# Patient Record
Sex: Male | Born: 1955 | Race: White | Hispanic: No | Marital: Married | Smoking: Former smoker
Health system: Southern US, Community
[De-identification: ages and names within clinical notes are randomized; demographics above are authoritative.]

## PROBLEM LIST (undated history)

## (undated) DIAGNOSIS — R972 Elevated prostate specific antigen [PSA]: Secondary | ICD-10-CM

## (undated) DIAGNOSIS — J449 Chronic obstructive pulmonary disease, unspecified: Secondary | ICD-10-CM

## (undated) DIAGNOSIS — I2510 Atherosclerotic heart disease of native coronary artery without angina pectoris: Secondary | ICD-10-CM

## (undated) DIAGNOSIS — K219 Gastro-esophageal reflux disease without esophagitis: Secondary | ICD-10-CM

## (undated) DIAGNOSIS — M1990 Unspecified osteoarthritis, unspecified site: Secondary | ICD-10-CM

## (undated) DIAGNOSIS — E785 Hyperlipidemia, unspecified: Secondary | ICD-10-CM

## (undated) DIAGNOSIS — N529 Male erectile dysfunction, unspecified: Secondary | ICD-10-CM

## (undated) DIAGNOSIS — I209 Angina pectoris, unspecified: Secondary | ICD-10-CM

## (undated) DIAGNOSIS — J189 Pneumonia, unspecified organism: Secondary | ICD-10-CM

## (undated) DIAGNOSIS — Z87442 Personal history of urinary calculi: Secondary | ICD-10-CM

## (undated) DIAGNOSIS — I10 Essential (primary) hypertension: Secondary | ICD-10-CM

## (undated) DIAGNOSIS — K859 Acute pancreatitis without necrosis or infection, unspecified: Secondary | ICD-10-CM

## (undated) DIAGNOSIS — F419 Anxiety disorder, unspecified: Secondary | ICD-10-CM

## (undated) DIAGNOSIS — M792 Neuralgia and neuritis, unspecified: Secondary | ICD-10-CM

## (undated) DIAGNOSIS — N400 Enlarged prostate without lower urinary tract symptoms: Secondary | ICD-10-CM

## (undated) DIAGNOSIS — J310 Chronic rhinitis: Secondary | ICD-10-CM

## (undated) DIAGNOSIS — M6208 Separation of muscle (nontraumatic), other site: Secondary | ICD-10-CM

## (undated) DIAGNOSIS — K224 Dyskinesia of esophagus: Secondary | ICD-10-CM

## (undated) HISTORY — DX: Hyperlipidemia, unspecified: E78.5

## (undated) HISTORY — PX: EXCISION MORTON'S NEUROMA: SHX5013

## (undated) HISTORY — DX: Dyskinesia of esophagus: K22.4

## (undated) HISTORY — DX: Atherosclerotic heart disease of native coronary artery without angina pectoris: I25.10

## (undated) HISTORY — DX: Neuralgia and neuritis, unspecified: M79.2

## (undated) HISTORY — DX: Angina pectoris, unspecified: I20.9

## (undated) HISTORY — PX: CHOLECYSTECTOMY: SHX55

## (undated) HISTORY — DX: Male erectile dysfunction, unspecified: N52.9

## (undated) HISTORY — PX: OTHER SURGICAL HISTORY: SHX169

## (undated) HISTORY — DX: Benign prostatic hyperplasia without lower urinary tract symptoms: N40.0

## (undated) HISTORY — DX: Elevated prostate specific antigen (PSA): R97.20

## (undated) HISTORY — DX: Essential (primary) hypertension: I10

## (undated) HISTORY — DX: Separation of muscle (nontraumatic), other site: M62.08

## (undated) HISTORY — PX: HERNIA REPAIR: SHX51

---

## 2004-01-31 ENCOUNTER — Inpatient Hospital Stay: Payer: Self-pay | Admitting: Internal Medicine

## 2006-01-03 ENCOUNTER — Inpatient Hospital Stay: Payer: Self-pay | Admitting: Surgery

## 2006-01-03 ENCOUNTER — Other Ambulatory Visit: Payer: Self-pay

## 2006-03-14 ENCOUNTER — Ambulatory Visit: Payer: Self-pay | Admitting: Unknown Physician Specialty

## 2006-08-20 ENCOUNTER — Emergency Department: Payer: Self-pay

## 2007-04-30 ENCOUNTER — Ambulatory Visit: Payer: Self-pay | Admitting: Internal Medicine

## 2008-03-26 HISTORY — PX: CARDIAC CATHETERIZATION: SHX172

## 2008-07-21 ENCOUNTER — Ambulatory Visit: Payer: Self-pay | Admitting: Internal Medicine

## 2008-07-29 ENCOUNTER — Ambulatory Visit: Payer: Self-pay | Admitting: Internal Medicine

## 2008-08-10 ENCOUNTER — Ambulatory Visit: Payer: Self-pay | Admitting: Unknown Physician Specialty

## 2009-08-22 ENCOUNTER — Ambulatory Visit: Payer: Self-pay | Admitting: Unknown Physician Specialty

## 2009-10-25 ENCOUNTER — Ambulatory Visit: Payer: Self-pay | Admitting: Pain Medicine

## 2009-11-07 ENCOUNTER — Ambulatory Visit: Payer: Self-pay | Admitting: Pain Medicine

## 2009-11-10 ENCOUNTER — Ambulatory Visit: Payer: Self-pay | Admitting: Pain Medicine

## 2009-11-24 ENCOUNTER — Ambulatory Visit: Payer: Self-pay | Admitting: Pain Medicine

## 2009-11-25 ENCOUNTER — Ambulatory Visit (HOSPITAL_COMMUNITY): Admission: RE | Admit: 2009-11-25 | Discharge: 2009-11-25 | Payer: Self-pay | Admitting: Neurosurgery

## 2011-11-11 ENCOUNTER — Inpatient Hospital Stay: Payer: Self-pay | Admitting: Internal Medicine

## 2011-11-11 LAB — AMYLASE: Amylase: 125 U/L — ABNORMAL HIGH (ref 25–115)

## 2011-11-11 LAB — URINALYSIS, COMPLETE
Bacteria: NONE SEEN
Glucose,UR: NEGATIVE mg/dL (ref 0–75)
Ketone: NEGATIVE
Leukocyte Esterase: NEGATIVE
Ph: 7 (ref 4.5–8.0)
Specific Gravity: 1.016 (ref 1.003–1.030)

## 2011-11-11 LAB — COMPREHENSIVE METABOLIC PANEL
Albumin: 3.7 g/dL (ref 3.4–5.0)
Alkaline Phosphatase: 109 U/L (ref 50–136)
Anion Gap: 5 — ABNORMAL LOW (ref 7–16)
BUN: 10 mg/dL (ref 7–18)
Potassium: 3.9 mmol/L (ref 3.5–5.1)
SGOT(AST): 30 U/L (ref 15–37)
SGPT (ALT): 34 U/L (ref 12–78)
Total Protein: 7.6 g/dL (ref 6.4–8.2)

## 2011-11-11 LAB — CBC
HCT: 43.9 % (ref 40.0–52.0)
RDW: 13.6 % (ref 11.5–14.5)
WBC: 9.3 10*3/uL (ref 3.8–10.6)

## 2011-11-11 LAB — TROPONIN I: Troponin-I: <0.02 ng/mL

## 2011-11-12 LAB — COMPREHENSIVE METABOLIC PANEL
Alkaline Phosphatase: 111 U/L (ref 50–136)
BUN: 8 mg/dL (ref 7–18)
Bilirubin,Total: 0.7 mg/dL (ref 0.2–1.0)
Co2: 25 mmol/L (ref 21–32)
Creatinine: 0.84 mg/dL (ref 0.60–1.30)
EGFR (Non-African Amer.): >60
Osmolality: 275 (ref 275–301)
Sodium: 138 mmol/L (ref 136–145)
Total Protein: 6.8 g/dL (ref 6.4–8.2)

## 2011-11-12 LAB — CBC WITH DIFFERENTIAL/PLATELET
Basophil #: 0.1 10*3/uL (ref 0.0–0.1)
Eosinophil %: 1.3 %
Lymphocyte #: 0.9 10*3/uL — ABNORMAL LOW (ref 1.0–3.6)
Lymphocyte %: 10.2 %
MCV: 88 fL (ref 80–100)
Monocyte %: 7.4 %
Neutrophil %: 80.4 %
Platelet: 160 10*3/uL (ref 150–440)
RBC: 4.49 10*6/uL (ref 4.40–5.90)
RDW: 13.3 % (ref 11.5–14.5)

## 2011-11-12 LAB — LIPASE, BLOOD: Lipase: 760 U/L — ABNORMAL HIGH (ref 73–393)

## 2011-11-13 LAB — BASIC METABOLIC PANEL
Anion Gap: 7 (ref 7–16)
BUN: 11 mg/dL (ref 7–18)
Chloride: 106 mmol/L (ref 98–107)
Co2: 25 mmol/L (ref 21–32)
Creatinine: 0.84 mg/dL (ref 0.60–1.30)
Glucose: 112 mg/dL — ABNORMAL HIGH (ref 65–99)
Osmolality: 276 (ref 275–301)
Potassium: 3.6 mmol/L (ref 3.5–5.1)

## 2011-11-13 LAB — LIPASE, BLOOD: Lipase: 820 U/L — ABNORMAL HIGH (ref 73–393)

## 2011-11-14 LAB — CBC WITH DIFFERENTIAL/PLATELET
Basophil #: 0.1 10*3/uL (ref 0.0–0.1)
Basophil %: 0.5 %
Eosinophil #: 0 10*3/uL (ref 0.0–0.7)
HCT: 34.6 % — ABNORMAL LOW (ref 40.0–52.0)
HGB: 11.8 g/dL — ABNORMAL LOW (ref 13.0–18.0)
Lymphocyte #: 0.9 10*3/uL — ABNORMAL LOW (ref 1.0–3.6)
Lymphocyte %: 6.7 %
MCHC: 34 g/dL (ref 32.0–36.0)
Monocyte #: 1.5 x10 3/mm — ABNORMAL HIGH (ref 0.2–1.0)
Neutrophil #: 11.6 10*3/uL — ABNORMAL HIGH (ref 1.4–6.5)
RDW: 13.4 % (ref 11.5–14.5)
WBC: 14.1 10*3/uL — ABNORMAL HIGH (ref 3.8–10.6)

## 2011-11-14 LAB — LIPID PANEL
Cholesterol: 175 mg/dL (ref 0–200)
Ldl Cholesterol, Calc: 110 mg/dL — ABNORMAL HIGH (ref 0–100)
Triglycerides: 145 mg/dL (ref 0–200)

## 2011-11-14 LAB — BASIC METABOLIC PANEL
Calcium, Total: 7.6 mg/dL — ABNORMAL LOW (ref 8.5–10.1)
Co2: 26 mmol/L (ref 21–32)
Potassium: 3.5 mmol/L (ref 3.5–5.1)
Sodium: 140 mmol/L (ref 136–145)

## 2011-11-14 LAB — LIPASE, BLOOD: Lipase: 236 U/L (ref 73–393)

## 2011-11-15 LAB — COMPREHENSIVE METABOLIC PANEL
Anion Gap: 6 — ABNORMAL LOW (ref 7–16)
Calcium, Total: 7.8 mg/dL — ABNORMAL LOW (ref 8.5–10.1)
Chloride: 106 mmol/L (ref 98–107)
Co2: 27 mmol/L (ref 21–32)
EGFR (African American): >60
EGFR (Non-African Amer.): >60
Potassium: 3.5 mmol/L (ref 3.5–5.1)
SGOT(AST): 30 U/L (ref 15–37)
SGPT (ALT): 31 U/L (ref 12–78)

## 2011-11-15 LAB — CBC WITH DIFFERENTIAL/PLATELET
Basophil %: 0.4 %
HCT: 33.9 % — ABNORMAL LOW (ref 40.0–52.0)
HGB: 11.8 g/dL — ABNORMAL LOW (ref 13.0–18.0)
Lymphocyte %: 9.7 %
MCHC: 34.9 g/dL (ref 32.0–36.0)

## 2011-11-16 LAB — CBC WITH DIFFERENTIAL/PLATELET
Basophil #: 0 10*3/uL (ref 0.0–0.1)
Lymphocyte %: 11.8 %
Monocyte %: 10.5 %
Neutrophil %: 76 %
Platelet: 182 10*3/uL (ref 150–440)
RDW: 13.5 % (ref 11.5–14.5)
WBC: 8.9 10*3/uL (ref 3.8–10.6)

## 2011-12-05 ENCOUNTER — Other Ambulatory Visit: Payer: Self-pay

## 2011-12-05 ENCOUNTER — Telehealth: Payer: Self-pay

## 2011-12-05 DIAGNOSIS — K861 Other chronic pancreatitis: Secondary | ICD-10-CM

## 2011-12-05 NOTE — Telephone Encounter (Signed)
Pt needs to be instructed and meds reviewed 

## 2011-12-05 NOTE — Telephone Encounter (Signed)
Pt has been notified and meds reviewed pt will call with any questions or concerns 

## 2011-12-05 NOTE — Telephone Encounter (Signed)
Left message on machine to call back  

## 2011-12-12 ENCOUNTER — Telehealth: Payer: Self-pay

## 2011-12-12 NOTE — Telephone Encounter (Signed)
Left message on machine to call back  

## 2011-12-12 NOTE — Telephone Encounter (Signed)
Pt has been re instructed and meds reviewed he will call if this does not work for him

## 2011-12-12 NOTE — Telephone Encounter (Signed)
eus changed to 01/17/12 1110 am

## 2012-01-17 ENCOUNTER — Encounter (HOSPITAL_COMMUNITY)
Admission: RE | Disposition: A | Discharge status: Home / Self Care | Payer: Self-pay | Source: Ambulatory Visit | Attending: Gastroenterology

## 2012-01-17 ENCOUNTER — Encounter (HOSPITAL_COMMUNITY): Payer: Self-pay | Admitting: Anesthesiology

## 2012-01-17 ENCOUNTER — Encounter (HOSPITAL_COMMUNITY): Payer: Self-pay | Admitting: *Deleted

## 2012-01-17 ENCOUNTER — Ambulatory Visit (HOSPITAL_COMMUNITY): Payer: 59 | Admitting: Anesthesiology

## 2012-01-17 ENCOUNTER — Ambulatory Visit (HOSPITAL_COMMUNITY)
Admission: RE | Admit: 2012-01-17 | Discharge: 2012-01-17 | Disposition: A | Discharge status: Home / Self Care | Payer: 59 | Source: Ambulatory Visit | Attending: Gastroenterology | Admitting: Gastroenterology

## 2012-01-17 DIAGNOSIS — K859 Acute pancreatitis without necrosis or infection, unspecified: Secondary | ICD-10-CM

## 2012-01-17 DIAGNOSIS — K838 Other specified diseases of biliary tract: Secondary | ICD-10-CM | POA: Insufficient documentation

## 2012-01-17 DIAGNOSIS — K8689 Other specified diseases of pancreas: Secondary | ICD-10-CM | POA: Insufficient documentation

## 2012-01-17 DIAGNOSIS — K219 Gastro-esophageal reflux disease without esophagitis: Secondary | ICD-10-CM | POA: Insufficient documentation

## 2012-01-17 DIAGNOSIS — K861 Other chronic pancreatitis: Secondary | ICD-10-CM

## 2012-01-17 DIAGNOSIS — Z9089 Acquired absence of other organs: Secondary | ICD-10-CM | POA: Insufficient documentation

## 2012-01-17 HISTORY — DX: Gastro-esophageal reflux disease without esophagitis: K21.9

## 2012-01-17 HISTORY — DX: Chronic rhinitis: J31.0

## 2012-01-17 HISTORY — DX: Acute pancreatitis without necrosis or infection, unspecified: K85.90

## 2012-01-17 HISTORY — PX: EUS: SHX5427

## 2012-01-17 HISTORY — DX: Unspecified osteoarthritis, unspecified site: M19.90

## 2012-01-17 HISTORY — DX: Anxiety disorder, unspecified: F41.9

## 2012-01-17 HISTORY — DX: Male erectile dysfunction, unspecified: N52.9

## 2012-01-17 SURGERY — UPPER ENDOSCOPIC ULTRASOUND (EUS) LINEAR
Anesthesia: Monitor Anesthesia Care

## 2012-01-17 MED ORDER — KETAMINE HCL 10 MG/ML IJ SOLN
INTRAMUSCULAR | Status: DC | PRN
  Administered 2012-01-17: 10 mg via INTRAVENOUS

## 2012-01-17 MED ORDER — PROPOFOL 10 MG/ML IV EMUL
INTRAVENOUS | Status: DC | PRN
  Administered 2012-01-17: 140 ug/kg/min via INTRAVENOUS

## 2012-01-17 MED ORDER — FENTANYL CITRATE 0.05 MG/ML IJ SOLN
INTRAMUSCULAR | Status: DC | PRN
  Administered 2012-01-17: 50 ug via INTRAVENOUS

## 2012-01-17 MED ORDER — BUTAMBEN-TETRACAINE-BENZOCAINE 2-2-14 % EX AERO
INHALATION_SPRAY | CUTANEOUS | Status: DC | PRN
  Administered 2012-01-17: 2 via TOPICAL

## 2012-01-17 MED ORDER — SODIUM CHLORIDE 0.9 % IV SOLN
INTRAVENOUS | Status: DC
  Administered 2012-01-17: 11:00:00 via INTRAVENOUS

## 2012-01-17 MED ORDER — MIDAZOLAM HCL 5 MG/5ML IJ SOLN
INTRAMUSCULAR | Status: DC | PRN
  Administered 2012-01-17: 2 mg via INTRAVENOUS

## 2012-01-17 NOTE — Anesthesia Postprocedure Evaluation (Signed)
  Anesthesia Post-op Note  Patient: Joseph Larson  Procedure(s) Performed: Procedure(s) (LRB): UPPER ENDOSCOPIC ULTRASOUND (EUS) LINEAR (N/A)  Patient Location: PACU  Anesthesia Type: MAC  Level of Consciousness: awake and alert   Airway and Oxygen Therapy: Patient Spontanous Breathing  Post-op Pain: mild  Post-op Assessment: Post-op Vital signs reviewed, Patient's Cardiovascular Status Stable, Respiratory Function Stable, Patent Airway and No signs of Nausea or vomiting  Post-op Vital Signs: stable  Complications: No apparent anesthesia complications

## 2012-01-17 NOTE — Anesthesia Preprocedure Evaluation (Signed)
Anesthesia Evaluation  Patient identified by MRN, date of birth, ID band Patient awake    Reviewed: Allergy & Precautions, H&P , NPO status , Patient's Chart, lab work & pertinent test results  Airway Mallampati: II TM Distance: >3 FB Neck ROM: Full    Dental No notable dental hx. (+) Teeth Intact   Pulmonary neg pulmonary ROS, former smoker,  breath sounds clear to auscultation  Pulmonary exam normal       Cardiovascular negative cardio ROS  Rhythm:Regular Rate:Normal     Neuro/Psych PSYCHIATRIC DISORDERS Anxiety negative neurological ROS  negative psych ROS   GI/Hepatic negative GI ROS, Neg liver ROS,   Endo/Other  negative endocrine ROS  Renal/GU negative Renal ROS  negative genitourinary   Musculoskeletal negative musculoskeletal ROS (+)   Abdominal   Peds negative pediatric ROS (+)  Hematology negative hematology ROS (+)   Anesthesia Other Findings Front upper caps  Reproductive/Obstetrics negative OB ROS                           Anesthesia Physical Anesthesia Plan  ASA: II  Anesthesia Plan: MAC   Post-op Pain Management:    Induction: Intravenous  Airway Management Planned: Nasal Cannula and Simple Face Mask  Additional Equipment:   Intra-op Plan:   Post-operative Plan: Extubation in OR  Informed Consent: I have reviewed the patients History and Physical, chart, labs and discussed the procedure including the risks, benefits and alternatives for the proposed anesthesia with the patient or authorized representative who has indicated his/her understanding and acceptance.   Dental advisory given  Plan Discussed with: CRNA  Anesthesia Plan Comments:         Anesthesia Quick Evaluation

## 2012-01-17 NOTE — H&P (Signed)
  HPI: This is a very pleasant man with at least two episodes of mild acute pancreatitis.  Had GB in past.  Occasional etoh intake.  CT scan while admitted 2 months ago ([facility]) suggested edema in head of pancrease    Past Medical History  Diagnosis Date  . Pancreatitis   . GERD (gastroesophageal reflux disease)   . Anxiety   . Rhinitis     chronic  . ED (erectile dysfunction)   . Arthritis     osteoarthritis    No past surgical history on file.  Current Facility-Administered Medications  Medication Dose Route Frequency Provider Last Rate Last Dose  . 0.9 %  sodium chloride infusion   Intravenous Continuous Rachael Fee, MD        Allergies as of 12/05/2011  . (No Known Allergies)    No family history on file.  History   Social History  . Marital Status: Married    Spouse Name: N/A    Number of Children: N/A  . Years of Education: N/A   Occupational History  . Not on file.   Social History Main Topics  . Smoking status: Not on file  . Smokeless tobacco: Not on file  . Alcohol Use: Not on file  . Drug Use: Not on file  . Sexually Active: Not on file   Other Topics Concern  . Not on file   Social History Narrative  . No narrative on file      Physical Exam: BP 155/104  Temp 98.1 F (36.7 C) (Oral)  Resp 13  SpO2 99% Constitutional: generally well-appearing Psychiatric: alert and oriented x3 Abdomen: soft, nontender, nondistended, no obvious ascites, no peritoneal signs, normal bowel sounds     Assessment and plan: 56 y.o. male with recurrent pancreatitis, unclear etiology  For upper EUS today

## 2012-01-17 NOTE — Preoperative (Signed)
Beta Blockers   Reason not to administer Beta Blockers:Not Applicable 

## 2012-01-17 NOTE — Op Note (Signed)
[facility] [address] [place], [address]   ENDOSCOPIC ULTRASOUND PROCEDURE REPORT  PATIENT: Joseph Larson, Joseph Larson  MR#: [id number] BIRTHDATE: [birth year]  GENDER: Male ENDOSCOPIST: Rachael Fee, MD REFERRED BY:  Lynnae Prude, M.D. PROCEDURE DATE:  01/17/2012 PROCEDURE:   Upper EUS ASA CLASS:      Class III INDICATIONS:   recurrent pancreatitis (GB removed several years ago). MEDICATIONS: MAC sedation, administered by CRNA  DESCRIPTION OF PROCEDURE:   After the risks benefits and alternatives of the procedure were  explained, informed consent was obtained. The patient was then placed in the left, lateral, decubitus postion and IV sedation was administered. Throughout the procedure, the patients blood pressure, pulse and oxygen saturations were monitored continuously.  Under direct visualization, the EUS 110107 and Pentax EUS Linear [id number] endoscope was introduced through the mouth  and advanced to the second portion of the duodenum .  Water was used as necessary to provide an acoustic interface.  Upon completion of the imaging, water was removed and the patient was sent to the recovery room in satisfactory condition.   Endoscopic findings: 1. Normal esophagus, stomach and duodenum  EUS findings: 1. The pancreatic parenchyma was somewhat edematous appearing but there were no discrete masses and no signs of chronic pancreatitis. 2. Main pancreatic duct was normal; non-dilated and pancreatic divisim was ruled out on this exam 3. CBD was slightly dilated (6.69mm, which is likely physiologic after gallbladder removal) and contained no stones 4. Gallbladder surgically absent 5. Limited views of liver, spleen, portal and splenic vessels were all normal  Impression: Mildly edematous pancreas without discrete masses or signs of chronic pancreatitis. Pancreatic divisim ruled out.  No retained CBD stones.  Unclear etiology of his recent pancreatitis.  Would observe clinically for now.   _______________________________ eSigned:  Rachael Fee, MD 01/17/2012 1:03 PM

## 2012-01-17 NOTE — Transfer of Care (Signed)
Immediate Anesthesia Transfer of Care Note  Patient: Joseph Larson  Procedure(s) Performed: Procedure(s) (LRB) with comments: UPPER ENDOSCOPIC ULTRASOUND (EUS) LINEAR (N/A) - radial linear   Patient Location: PACU  Anesthesia Type: MAC  Level of Consciousness: awake, alert , oriented and patient cooperative  Airway & Oxygen Therapy: Patient Spontanous Breathing and Patient connected to nasal cannula oxygen  Post-op Assessment: Report given to PACU RN, Post -op Vital signs reviewed and stable and Patient moving all extremities X 4  Post vital signs: Reviewed and stable  Complications: No apparent anesthesia complications

## 2012-01-21 ENCOUNTER — Encounter (HOSPITAL_COMMUNITY): Payer: Self-pay | Admitting: Gastroenterology

## 2012-09-07 ENCOUNTER — Emergency Department: Payer: Self-pay | Admitting: Internal Medicine

## 2012-09-07 LAB — COMPREHENSIVE METABOLIC PANEL
Albumin: 3.8 g/dL (ref 3.4–5.0)
Anion Gap: 7 (ref 7–16)
BUN: 18 mg/dL (ref 7–18)
Chloride: 106 mmol/L (ref 98–107)
Creatinine: 1.65 mg/dL — ABNORMAL HIGH (ref 0.60–1.30)
EGFR (African American): 52 — ABNORMAL LOW
EGFR (Non-African Amer.): 45 — ABNORMAL LOW
Osmolality: 276 (ref 275–301)
Total Protein: 7.3 g/dL (ref 6.4–8.2)

## 2012-09-07 LAB — URINALYSIS, COMPLETE
Bilirubin,UR: NEGATIVE
Blood: NEGATIVE
Glucose,UR: NEGATIVE mg/dL (ref 0–75)
Leukocyte Esterase: NEGATIVE
WBC UR: <1 /HPF (ref 0–5)

## 2012-09-07 LAB — CBC
HCT: 41.2 % (ref 40.0–52.0)
MCH: 30.2 pg (ref 26.0–34.0)
Platelet: 181 10*3/uL (ref 150–440)
RBC: 4.76 10*6/uL (ref 4.40–5.90)

## 2014-01-25 DIAGNOSIS — R972 Elevated prostate specific antigen [PSA]: Secondary | ICD-10-CM | POA: Insufficient documentation

## 2014-05-03 DIAGNOSIS — M5136 Other intervertebral disc degeneration, lumbar region: Secondary | ICD-10-CM | POA: Insufficient documentation

## 2014-05-03 DIAGNOSIS — S39012A Strain of muscle, fascia and tendon of lower back, initial encounter: Secondary | ICD-10-CM | POA: Insufficient documentation

## 2014-05-14 DIAGNOSIS — M5116 Intervertebral disc disorders with radiculopathy, lumbar region: Secondary | ICD-10-CM | POA: Insufficient documentation

## 2014-06-04 ENCOUNTER — Ambulatory Visit: Payer: Self-pay | Admitting: Surgery

## 2014-07-07 DIAGNOSIS — M48061 Spinal stenosis, lumbar region without neurogenic claudication: Secondary | ICD-10-CM | POA: Insufficient documentation

## 2014-07-13 NOTE — H&P (Signed)
PATIENT NAME:  EDU, ON MR#:  [id number] DATE OF BIRTH:  Feb 17, 1956  DATE OF ADMISSION:  11/11/2011  PRIMARY CARE PHYSICIAN: Dr. Netty Starring   HISTORY OF PRESENT ILLNESS: History obtained from patient, family at bedside, old records reviewed, imaging studies and EKG reviewed personally. Case discussed with ER physician.   CHIEF COMPLAINT: Acute abdominal pain of one day.   HISTORY OF PRESENT ILLNESS: 59 year old male patient with history of arthritis, tobacco abuse, acute pancreatitis in 2007 status post cholecystectomy presents to the Emergency Room complaining of acute onset of epigastric abdominal pain yesterday evening. This has progressively worsened along with significant nausea and patient presented to Emergency Room. He did not have any vomiting, diarrhea, fever, rash. The only new medication he has been started on is nabumetone 500 mg oral 2 times a day. Patient had has not had any problems since 2007 with pancreatitis. Today his lipase is 1300 with CT scan of the abdomen showing no acute abnormalities.   Case was discussed with Dr. Vira Agar who has suggested patient be admitted to the hospital.   Patient has no aggravating or relieving factors. No radiation.   PAST MEDICAL HISTORY:  1. Tobacco abuse. 2. Acute pancreatitis in 2007 which is thought to be secondary to gallbladder and had cholecystectomy.  3. Arthritis.  4. Gastroesophageal reflux disease.   FAMILY HISTORY: Positive for father with carcinoma of the liver and alcoholism. Mother with diabetes.   SOCIAL HISTORY: Patient smokes 1/2 pack a day. Occasional alcohol, one drink twice a month. No illicit drugs. Lives at home with his wife.   CODE STATUS: FULL CODE.    ALLERGIES: Codeine makes him nauseated.   REVIEW OF SYSTEMS: CONSTITUTIONAL: No fever, fatigue, weakness. EYES: No blurred or double vision, pain, redness. ENT: No tinnitus, ear pain, hearing loss or allergies. RESPIRATORY: No cough, wheeze, hemoptysis.  CARDIOVASCULAR: No chest pain, orthopnea, edema. GASTROINTESTINAL: Complains of nausea. No vomiting, diarrhea. Complains of epigastric abdominal pain. GENITOURINARY: No dysuria, hematuria. ENDOCRINE: No polyuria, nocturia, thyroid problems. HEMATOLOGIC/LYMPHATIC: No Patient complains of arthritis. No swelling or redness. NEUROLOGIC: No focal numbness, weakness, dysarthria. PSYCHIATRIC: No anxiety, depression.   HOME MEDICATIONS:  1. Nexium 40 mg oral once a day.  2. Nabumetone 500 mg oral 2 times a day.   PHYSICAL EXAMINATION:  VITAL SIGNS: Temperature 97.6, pulse 66, respirations 18, blood pressure 141/95, saturating 97% on room air.   GENERAL: Obese Caucasian male patient lying in bed in significant respiratory distress secondary to his abdominal pain holding his abdomen and restless.   PSYCHIATRIC: Alert, oriented x3, anxious, restless. Normal judgment.   HEENT: Atraumatic, normocephalic. Oral mucosa moist and pink. External ears and nose normal. No pallor. No icterus. Pupils bilaterally equal and reactive to light.   NECK: Supple. No thyromegaly. No palpable lymph nodes. Trachea midline. No carotid bruit, JVD.   CARDIOVASCULAR: S1, S2, regular rate and rhythm without any murmurs. Peripheral pulses 2+.   RESPIRATORY: Normal work of breathing. Clear to auscultation on both sides, not using accessory muscles.   GASTROINTESTINAL: Soft abdomen, tenderness in the epigastric and left upper quadrant area maximum but tenderness on deep palpation all over. No rigidity, guarding. Bowel sounds present. No hepatosplenomegaly palpable.   SKIN: Warm and dry. No petechiae, rash, ulcers.   MUSCULOSKELETAL: No joint swelling, redness, effusion of the large joints. Normal muscle tone.   NEUROLOGICAL: Motor strength 5/5 in upper and lower extremities. Sensation to fine touch intact all over.   LABORATORY, DIAGNOSTIC AND RADIOLOGICAL DATA: Lab  studies show glucose 77, BUN 10, creatinine 0.99, lipase  1339, calcium 8.4. AST, ALT, alkaline phosphatase, bilirubin normal. Troponin less than 0.02. WBC 9.3, hemoglobin 14.9, platelets 190. Urinalysis shows no WBC or bacteria.   CT scan of the abdomen and pelvis shows no acute abdominal or pelvic pathology, shows hepatic steatosis.   EKG shows normal sinus rhythm.   ASSESSMENT AND PLAN:  1. Acute pancreatitis with severely elevated lipase. Will start patient on IV fluids. No clear etiology at this time. Will check fasting lipid profile. Will consult GI for further input with the case. Patient has already had cholecystectomy in the past. Liver function test is in the normal range at this time. With repeat lipase in the morning.  2. Arthritis, stable. Continue medications.  3. Gastroesophageal reflux disease. Continue his Nexium.  4. Elevated blood pressure without diagnosis of hypertension, likely secondary from the pain. Needs to be monitored. Does not have diagnosis of hypertension previously.  5. Tobacco abuse. Patient has been counseled for more than three minutes to quit smoking. Patient is in contemplation stage. Offered nicotine patch.  6. Deep vein thrombosis prophylaxis with heparin.  7. CODE STATUS: FULL CODE.     TIME SPENT: Time spent today on this case was 45 minutes with more than 50% time spent in coordination of care.   ____________________________ Leia Alf. Hideo Googe, MD srs:cms D: 11/11/2011 21:17:14 ET T: 11/12/2011 08:06:24 ET JOB#: [id number]  cc: Alveta Heimlich R. Darvin Neighbours, MD, <Dictator> Dion Body, MD Manya Silvas, MD Neita Carp MD ELECTRONICALLY SIGNED 11/12/2011 12:24

## 2014-07-13 NOTE — Consult Note (Signed)
Chief Complaint:   Subjective/Chief Complaint Looks comfortable but still c/o upper abdominal discomfort and bloating. No vomiting. Tolerating full liquids but does not want to drink too much.   VITAL SIGNS/ANCILLARY NOTES: **Vital Signs.:   23-Aug-13 14:30   Vital Signs Type Routine   Temperature Temperature (F) 97.5   Celsius 36.3   Temperature Source Oral   Pulse Pulse 58   Respirations Respirations 18   Systolic BP Systolic BP 409   Diastolic BP (mmHg) Diastolic BP (mmHg) 69   Mean BP 85   Pulse Ox % Pulse Ox % 92   Pulse Ox Activity Level  At rest   Oxygen Delivery Room Air/ 21 %   Brief Assessment:   Additional Physical Exam Abdomen is somewhat distended but soft. Bowel sounds normal.   Lab Results: Routine Hem:  23-Aug-13 04:59    WBC (CBC) 8.9   RBC (CBC)  3.76   Hemoglobin (CBC)  11.5   Hematocrit (CBC)  33.3   Platelet Count (CBC) 182   MCV 89   MCH 30.6   MCHC 34.6   RDW 13.5   Neutrophil % 76.0   Lymphocyte % 11.8   Monocyte % 10.5   Eosinophil % 1.2   Basophil % 0.5   Neutrophil #  6.8   Lymphocyte # 1.1   Monocyte # 0.9   Eosinophil # 0.1   Basophil # 0.0 (Result(s) reported on 16 Nov 2011 at 05:54AM.)   Assessment/Plan:  Assessment/Plan:   Assessment Acute pancreatitis, much better. patient continues to be unhappy about the abdominal discomfort although overall seems much more comfortable. No leucocytosis or fever.    Plan Agree with slowly advancing diet. Agree with PO pain meds. Probable DC in 1-2 days. Will sign off. Please call on call GI over the weekend if needed.   Electronic Signatures: Jill Side (MD)  (Signed 23-Aug-13 15:29)  Authored: Chief Complaint, VITAL SIGNS/ANCILLARY NOTES, Brief Assessment, Lab Results, Assessment/Plan   Last Updated: 23-Aug-13 15:29 by Jill Side (MD)

## 2014-07-13 NOTE — Consult Note (Signed)
Chief Complaint:   Subjective/Chief Complaint Still with same abdominal pain. No vomiting. No bowel movements. Repeat CT negative for any acute intra-abdominal pathology except mild pancreatitis. Lipase normal. Abdomen is soft. Positive tenderness but no rebound or other peritoneal signs.  Impression: Acute pancreatitis. Lipase is now normal and CT does not show any necrosis or psudocyst although patient continues to complain of sigificant pain. Abdominal examination is non acute as well.   Recommendations: Continue NPO. Obtain serum lactic acid. EGD in am although doubt PUD as the culprit. Will follow. I will be out of town for the rest of the day please contact Dr. Candace Cruise if needed. Thanks.   Electronic Signatures: Jill Side (MD)  (Signed 21-Aug-13 09:06)  Authored: Chief Complaint   Last Updated: 21-Aug-13 09:06 by Jill Side (MD)

## 2014-07-13 NOTE — Consult Note (Signed)
Chief Complaint:   Subjective/Chief Complaint Continues to complain of severe abdominal pai. Lipase better. Amylase normal. No BM or flatus.   VITAL SIGNS/ANCILLARY NOTES: **Vital Signs.:   20-Aug-13 14:02   Vital Signs Type Routine   Temperature Temperature (F) 98.9   Celsius 37.1   Temperature Source AdultAxillary   Pulse Pulse 55   Respirations Respirations 18   Systolic BP Systolic BP 300   Diastolic BP (mmHg) Diastolic BP (mmHg) 73   Mean BP 94   Pulse Ox % Pulse Ox % 93   Pulse Ox Activity Level  At rest   Oxygen Delivery Room Air/ 21 %   Brief Assessment:   Additional Physical Exam Positive abdominal tenderness in upper abdomen. Very sluggish bowel sounds. No rebound.   Lab Results: Routine Chem:  20-Aug-13 04:10    Glucose, Serum  112   BUN 11   Creatinine (comp) 0.84   Sodium, Serum 138   Potassium, Serum 3.6   Chloride, Serum 106   CO2, Serum 25   Calcium (Total), Serum  7.6   Anion Gap 7   Osmolality (calc) 276   eGFR (African American) >60   eGFR (Non-African American) >60 (eGFR values <36m/min/1.73 m2 may be an indication of chronic kidney disease (CKD). Calculated eGFR is useful in patients with stable renal function. The eGFR calculation will not be reliable in acutely ill patients when serum creatinine is changing rapidly. It is not useful in  patients on dialysis. The eGFR calculation may not be applicable to patients at the low and high extremes of body sizes, pregnant women, and vegetarians.)   Lipase  820 (Result(s) reported on 13 Nov 2011 at 05:28AM.)   Amylase, Serum 82 (Result(s) reported on 13 Nov 2011 at 05:28AM.)   Assessment/Plan:  Assessment/Plan:   Assessment ? Acute pancreatitis. His pain is out of proportion to CT findings.    Plan Repeat CT scan today to r/o other etiologies. Continue present management.   Electronic Signatures: IJill Side(MD)  (Signed 20-Aug-13 17:43)  Authored: Chief Complaint, VITAL  SIGNS/ANCILLARY NOTES, Brief Assessment, Lab Results, Assessment/Plan   Last Updated: 20-Aug-13 17:43 by IJill Side(MD)

## 2014-07-13 NOTE — Consult Note (Signed)
Chief Complaint:   Subjective/Chief Complaint EGD normal. Clear liquid diet, advance as tolerated.  Consider reducing narcotics. Will follow.   Electronic Signatures: Jill Side (MD)  (Signed [phone] 15:25)  Authored: Chief Complaint   Last Updated: 22-Aug-13 15:25 by Jill Side (MD)

## 2014-07-13 NOTE — Discharge Summary (Signed)
PATIENT NAME:  Joseph Larson, Joseph Larson MR#:  [id number] DATE OF BIRTH:  14-Sep-1955  DATE OF ADMISSION:  11/11/2011 DATE OF DISCHARGE:  11/17/2011  HISTORY OF PRESENT ILLNESS: Joseph Larson is a middle-aged gentleman who presented to the Emergency Room with a 24-hour history of abdominal pain. In the Emergency Room he was found to have a lipase of 1300, although his CT scan of the abdomen showed no acute abnormalities. The patient was therefore admitted for further evaluation. It was noted that he had had acute pancreatitis in 2007 that was felt to be due to gallstones and underwent cholecystectomy at that time.   PAST MEDICAL HISTORY:  1. Gastroesophageal reflux. 2. Osteoarthritis. 3. Tobacco abuse.   ALLERGIES: Codeine.   MEDICATIONS: The patient's only medication was basically nabumetone 500 mg b.i.d. for arthritis.    ADMISSION VITAL SIGNS: Temperature 97.6, pulse 66, respirations 18, blood pressure 141/95. Examination as described by the admitting physician was notable only for tenderness in the epigastrium and left upper quadrant. Bowel sounds were noted to be normal. There were no masses or organomegaly. The remainder of the examination was basically unremarkable.   LABORATORY, DIAGNOSTIC, AND RADIOLOGIC DATA: Admission CBC showed a hemoglobin of 14.9 with hematocrit of 43.9. White count was 9300. Platelet count was 190,000. Admission comprehensive metabolic panel showed a calcium of 8.4 but was otherwise unremarkable. Admission lipase was 1339. Amylase was 125. Admission urinalysis was unremarkable. Admission electrocardiogram was within normal limits. CT scan of the abdomen and pelvis was suggestive of fatty infiltration of the liver, but was otherwise unremarkable.   HOSPITAL COURSE: The patient was admitted to the regular medical floor where he was rehydrated with IV fluids and made n.p.o.  He was seen in consultation by gastroenterology who felt like the patient did have acute pancreatitis. A repeat  CT scan was done on 08/20, which this time was consistent with pancreatitis. The patient's hospital course was one of slow but gradual improvement. His diet was gradually progressed. He was ambulated without difficulty. He was eventually bridged from injectable pain medication to pain medication by mouth.   DISCHARGE DIAGNOSIS: Acute pancreatitis.   DISCHARGE DISPOSITION: The patient was discharged on a soft diet as tolerated. He was also placed on Protonix 40 mg b.i.d. He was advised he could continue his arthritis medication. He did not give a very significant history of alcohol use but was advised to avoid all alcohol. He was given a prescription for oxycodone 5 mg, to take 1 to 2 tablets every six hours as needed for pain. He is to follow up with Dr. Netty Starring within the week. He is to stay out of work until he is seen in followup.     ____________________________ Hewitt Blade. Sarina Ser, MD [email or web address] D: 11/22/2011 08:05:30 ET T: 11/22/2011 13:53:42 ET JOB#: [id number]  cc: Joseph Reichmann B. Sarina Ser, MD, <Dictator> Joseph Mussel III MD ELECTRONICALLY SIGNED 11/28/2011 6:20

## 2014-07-13 NOTE — Consult Note (Signed)
Brief Consult Note: Diagnosis: Acute pancreatitis.   Patient was seen by consultant.   Consult note dictated.   Comments: Acute pancreatitis. ? etiology.  Recommendations: NPO. Increase Demerol to 50 mg q 4 hours PRN. Continue Zofran and Phenergan. Will follow.  Electronic Signatures: Jill Side (MD)  (Signed 19-Aug-13 18:20)  Authored: Brief Consult Note   Last Updated: 19-Aug-13 18:20 by Jill Side (MD)

## 2014-07-13 NOTE — Consult Note (Signed)
PATIENT NAME:  Joseph Larson, MONGER MR#:  [id number] DATE OF BIRTH:  08-26-1955  DATE OF CONSULTATION:  11/12/2011  REFERRING PHYSICIAN:  Prime Doc  CONSULTING PHYSICIAN:  Jill Side, MD  REASON FOR CONSULTATION: Acute pancreatitis.   HISTORY OF PRESENT ILLNESS: This is a 59 year old male with history of arthritis, tobacco abuse, history of pancreatitis in 2007 status post cholecystectomy. The patient was admitted yesterday with acute onset of epigastric abdominal pain. Serum lipase was about 1500. CT scan of the abdomen did not show any acute pancreatic abnormalities. LFTs were normal. The patient denies starting any new drugs except for nabumetone which is not a well known drug to cause pancreatitis. The patient drinks occasionally. According to him, the last time he had a drink was about a week ago, according to the wife it was probably two weeks ago.   PAST MEDICAL HISTORY:  1. History of tobacco abuse. 2. Acute pancreatitis in the past.  3. History of arthritis. 4. Gastroesophageal reflux disease.   FAMILY HISTORY: Positive for father with carcinoma of the liver and alcoholism.   ALLERGIES: Codeine.   REVIEW OF SYSTEMS: The patient is complaining of severe abdominal pain as well as nausea. He vomited earlier today. He is passing flatus but no bowel movements.   MEDICATIONS AT HOME:  1. Nexium 40 mg a day. 2. Nabumetone 500 mg b.i.d.   PHYSICAL EXAMINATION:   GENERAL: Middle-aged male who appears uncomfortable due to abdominal pain.   VITAL SIGNS: Temperature 98.8, heart rate is in the 60's and 70's, blood pressure 152/80. Clinically he is not jaundiced.   NECK: Neck veins are flat.   LUNGS: Grossly clear to auscultation bilaterally with fair air entry and no added sounds.   CARDIOVASCULAR: Regular rate and rhythm.   ABDOMEN: Very sluggish bowel sounds. Abdomen is somewhat distended but not tense. Significant epigastric tenderness was noted without any rebound.    EXTREMITIES: No edema.   NEUROLOGIC: Appears to be unremarkable.   LABORATORY, DIAGNOSTIC, AND RADIOLOGICAL DATA: CBC is within normal limits. White cell count is normal. Serum lipase is now down to 760. Liver enzymes are normal.   CT scan of abdomen and pelvis is quite unremarkable.   ASSESSMENT AND PLAN: The patient is with what appears to be acute pancreatitis with high lipase and typical epigastric abdominal pain. CT was quite unremarkable and no significant pancreatic inflammation was noted. The patient continues to be with severe abdominal pain. He has been receiving only 12.5 mg of Demerol every four hours. For some reason he did not tolerate morphine or Dilaudid in the Emergency Room. I would increase the Demerol to 50 mg every four hours p.r.n. for abdominal pain. Continue IV hydration. Continue to keep him n.p.o. The etiology of his acute pancreatitis is not clear. Alcohol remains a possibility. Liver enzymes are normal and no significant ductal abnormalities were noted on the CT scan making CBD stones unlikely, although sludge or passage of biliary crystals can induce pancreatitis and remains a possibility.   Further recommendations to follow. Will follow.   ____________________________ Jill Side, MD si:drc D: 11/12/2011 18:24:26 ET T: 11/13/2011 09:50:10 ET JOB#: [id number] cc: Jill Side, MD, <Dictator> Jill Side MD ELECTRONICALLY SIGNED 11/16/2011 16:24

## 2014-07-15 ENCOUNTER — Encounter
Admit: 2014-07-15 | Disposition: A | Discharge status: Home / Self Care | Payer: Self-pay | Attending: Orthopedic Surgery | Admitting: Orthopedic Surgery

## 2014-08-03 ENCOUNTER — Encounter: Payer: Self-pay | Admitting: Physical Therapy

## 2014-08-03 ENCOUNTER — Ambulatory Visit: Payer: 59 | Attending: Orthopedic Surgery | Admitting: Physical Therapy

## 2014-08-03 DIAGNOSIS — M48061 Spinal stenosis, lumbar region without neurogenic claudication: Secondary | ICD-10-CM

## 2014-08-03 DIAGNOSIS — M4806 Spinal stenosis, lumbar region: Secondary | ICD-10-CM | POA: Insufficient documentation

## 2014-08-03 DIAGNOSIS — M545 Low back pain: Secondary | ICD-10-CM | POA: Insufficient documentation

## 2014-08-03 NOTE — Therapy (Signed)
[facility] [address], [place], [address] Phone: [phone]   Fax:  [phone]  Physical Therapy Treatment  Patient Details  Name: Joseph Larson MRN: [id number] Date of Birth: [birth year] Referring Provider:  Priscille Kluver, *  Encounter Date: 08/03/2014    Past Medical History  Diagnosis Date  . Pancreatitis   . GERD (gastroesophageal reflux disease)   . Anxiety   . Rhinitis     chronic  . ED (erectile dysfunction)   . Arthritis     osteoarthritis    Past Surgical History  Procedure Laterality Date  . Eus  01/17/2012    Procedure: UPPER ENDOSCOPIC ULTRASOUND (EUS) LINEAR;  Surgeon: Milus Banister, MD;  Location: [facility];  Service: Endoscopy;  Laterality: N/A;  radial linear     There were no vitals filed for this visit.  Visit Diagnosis:  Spinal stenosis of lumbar region                   Adult Aquatic Therapy - 08/03/14 1153    Aquatic Therapy Subjective   Subjective Patient reports his pain level is at a 4/10 but on saturday he says it was a 10/10. Pt does not have decreased pain when in pool and no activities seem to change his pain for the better.    Treatment   Gait Patient ambulated forward x 4 laps, sidestepping x 2 laps, backward x 4 laps, marching x 2 laps.    Exercises Standing exercises to include hip extension, marching, knee to elbow, trunk twists, shoulder flexion, shoulder adduction, all x 2 min.  lumbar extension stretch 3x20 sec.                         PT Long Term Goals - 08/03/14 1048    PT LONG TERM GOAL #1   Title (p) Pt will improve mODI score by at least 6 points as a demonstration of improved function.   Time (p) 5   Period (p) Weeks   Status (p) On-going   PT LONG TERM GOAL #2   Title (p) Pt will be I with HEP to improve ability to take showers, don and doff clothes with less pain.   Time (p) 5   Period (p) Weeks   Status (p) On-going   PT LONG TERM GOAL #3   Title (p) Pt will have a decr. in low back pain to 4/10 or less at worst to improve ability to don and doff pants and socks as well as to improve ability to pick up items from the floor.   Time (p) 5   Period (p) Weeks   Status (p) On-going   PT LONG TERM GOAL #4   Title (p) Pt will improve B hpi ext. ROM to at least 0 degr. to improve ability to perform tasks with less back pain   Time (p) 5   Period (p) Weeks   Status (p) On-going               Problem List Patient Active Problem List   Diagnosis Date Noted  . Pancreatitis 01/17/2012    Julliette Frentz, PT 08/03/2014, 11:56 AM  [facility] [address] [place], [address] Phone: [phone]   Fax:  [phone]

## 2014-08-05 ENCOUNTER — Ambulatory Visit: Payer: 59 | Admitting: Physical Therapy

## 2014-08-05 DIAGNOSIS — M4806 Spinal stenosis, lumbar region: Secondary | ICD-10-CM | POA: Diagnosis not present

## 2014-08-05 DIAGNOSIS — M48061 Spinal stenosis, lumbar region without neurogenic claudication: Secondary | ICD-10-CM

## 2014-08-05 NOTE — Therapy (Signed)
[facility] [address], [place], [address] Phone: [phone]   Fax:  [phone]  Physical Therapy Treatment  Patient Details  Name: Joseph Larson MRN: [id number] Date of Birth: Jan 23, 1956 Referring Provider:  No ref. provider found  Encounter Date: 08/05/2014      PT End of Session - 08/05/14 0906    Visit Number 4   PT Start Time 0800   PT Stop Time 0845   PT Time Calculation (min) 45 min   Activity Tolerance Patient tolerated treatment well;No increased pain   Behavior During Therapy [facility] for tasks assessed/performed      Past Medical History  Diagnosis Date  . Pancreatitis   . GERD (gastroesophageal reflux disease)   . Anxiety   . Rhinitis     chronic  . ED (erectile dysfunction)   . Arthritis     osteoarthritis    Past Surgical History  Procedure Laterality Date  . Eus  01/17/2012    Procedure: UPPER ENDOSCOPIC ULTRASOUND (EUS) LINEAR;  Surgeon: Milus Banister, MD;  Location: [facility];  Service: Endoscopy;  Laterality: N/A;  radial linear     There were no vitals filed for this visit.  Visit Diagnosis:  Spinal stenosis of lumbar region      Subjective Assessment - 08/05/14 0905    Subjective Patient reports he thought this morning was better until he started moving.                      Adult Aquatic Therapy - 08/05/14 0856    Aquatic Therapy Subjective   Subjective Patient reports 4/10 pain initially, after session rates 2.5/10 pain. Also states he feels more mobile after session.    Treatment   Gait Patient ambulated forward x 4 laps, sidestepping x 2 laps, backward x 4 laps, marching x 2 laps.    Exercises Standing exercises to include: marching, low back extension stretch 2x20 sec, hip lateral translation 2x 30 sec each side.   Specific Exercises Hip/Low Back   Hip/Low Back push/pull with kickboard and lumbar rotation with kickboard x 2 min each for core  strengthening.                    PT Education - 08/05/14 0906    Education provided Yes   Person(s) Educated Patient   Methods Explanation;Demonstration   Comprehension Verbalized understanding;Returned demonstration             PT Long Term Goals - 08/03/14 1048    PT LONG TERM GOAL #1   Title (p) Pt will improve mODI score by at least 6 points as a demonstration of improved function.   Time (p) 5   Period (p) Weeks   Status (p) On-going   PT LONG TERM GOAL #2   Title (p) Pt will be I with HEP to improve ability to take showers, don and doff clothes with less pain.   Time (p) 5   Period (p) Weeks   Status (p) On-going   PT LONG TERM GOAL #3   Title (p) Pt will have a decr. in low back pain to 4/10 or less at worst to improve ability to don and doff pants and socks as well as to improve ability to pick up items from the floor.   Time (p) 5   Period (p) Weeks   Status (p) On-going   PT LONG TERM GOAL #4  Title (p) Pt will improve B hpi ext. ROM to at least 0 degr. to improve ability to perform tasks with less back pain   Time (p) 5   Period (p) Weeks   Status (p) On-going               Plan - 08/05/14 0907    Clinical Impression Statement Patient seems to have good results this session with pain decrease.    Pt will benefit from skilled therapeutic intervention in order to improve on the following deficits Pain;Difficulty walking   Rehab Potential Good   PT Frequency 2x / week   PT Treatment/Interventions Aquatic Therapy;Therapeutic exercise   Consulted and Agree with Plan of Care Patient        Problem List Patient Active Problem List   Diagnosis Date Noted  . Pancreatitis 01/17/2012    Tramel Westbrook, PT 08/05/2014, 9:12 AM  [facility] [address], [place], [address] Phone: [phone]   Fax:  [phone]

## 2014-08-10 ENCOUNTER — Ambulatory Visit: Payer: 59 | Admitting: Physical Therapy

## 2014-08-10 DIAGNOSIS — M4806 Spinal stenosis, lumbar region: Secondary | ICD-10-CM | POA: Diagnosis not present

## 2014-08-10 DIAGNOSIS — R262 Difficulty in walking, not elsewhere classified: Secondary | ICD-10-CM

## 2014-08-10 DIAGNOSIS — M48061 Spinal stenosis, lumbar region without neurogenic claudication: Secondary | ICD-10-CM

## 2014-08-10 NOTE — Therapy (Signed)
[facility] [address], [place], [address] Phone: [phone]   Fax:  [phone]  Physical Therapy Treatment  Patient Details  Name: Joseph Larson MRN: [id number] Date of Birth: 07/07/1955 Referring Provider:  No ref. provider found  Encounter Date: 08/10/2014      PT End of Session - 08/10/14 1107    Visit Number 5   PT Start Time 0845   PT Stop Time 0930   PT Time Calculation (min) 45 min   Activity Tolerance Patient tolerated treatment well;No increased pain   Behavior During Therapy [facility] for tasks assessed/performed      Past Medical History  Diagnosis Date  . Pancreatitis   . GERD (gastroesophageal reflux disease)   . Anxiety   . Rhinitis     chronic  . ED (erectile dysfunction)   . Arthritis     osteoarthritis    Past Surgical History  Procedure Laterality Date  . Eus  01/17/2012    Procedure: UPPER ENDOSCOPIC ULTRASOUND (EUS) LINEAR;  Surgeon: Milus Banister, MD;  Location: [facility];  Service: Endoscopy;  Laterality: N/A;  radial linear     There were no vitals filed for this visit.  Visit Diagnosis:  Spinal stenosis of lumbar region  Difficulty walking      Subjective Assessment - 08/10/14 1102    Subjective Patient reports he flet really good after Thursday's session until saturday when he decided to mow the lawn. Then his pain came back and he was in pain all weekend.   Limitations Sitting;Standing;Lifting;Walking;House hold activities   Currently in Pain? Yes   Pain Location Leg   Pain Orientation Left   Pain Descriptors / Indicators Aching;Sharp;Shooting;Stabbing   Pain Type Chronic pain   Pain Onset More than a month ago   Pain Frequency Intermittent   Multiple Pain Sites No                     Adult Aquatic Therapy - 08/10/14 1104    Aquatic Therapy Subjective   Subjective Patient reports feeling a little better after session.    Treatment   Gait Patient ambulated forward x 6 laps, sidestepping x 4 laps, backward x 4 laps, marching x 2 laps.    Exercises Standing exercises to include: marching, hip abduction, hip extension, squats x 2 min each   Specific Exercises Hip/Low Back   Hip/Low Back push/pull with kickboard and lumbar rotation with kickboard x 2 min each for core strengthening.                    PT Education - 08/10/14 1107    Education provided Yes   Person(s) Educated Patient   Methods Explanation;Demonstration   Comprehension Returned demonstration;Verbal cues required             PT Long Term Goals - 08/03/14 1048    PT LONG TERM GOAL #1   Title (p) Pt will improve mODI score by at least 6 points as a demonstration of improved function.   Time (p) 5   Period (p) Weeks   Status (p) On-going   PT LONG TERM GOAL #2   Title (p) Pt will be I with HEP to improve ability to take showers, don and doff clothes with less pain.   Time (p) 5   Period (p) Weeks   Status (p) On-going   PT LONG TERM GOAL #3   Title (p) Pt  will have a decr. in low back pain to 4/10 or less at worst to improve ability to don and doff pants and socks as well as to improve ability to pick up items from the floor.   Time (p) 5   Period (p) Weeks   Status (p) On-going   PT LONG TERM GOAL #4   Title (p) Pt will improve B hpi ext. ROM to at least 0 degr. to improve ability to perform tasks with less back pain   Time (p) 5   Period (p) Weeks   Status (p) On-going               Plan - 08/10/14 1108    Clinical Impression Statement Patient continues to have intermittent increased pain depending on activities. Pain level continues to fluctuate.   Pt will benefit from skilled therapeutic intervention in order to improve on the following deficits Abnormal gait;Decreased activity tolerance;Pain;Decreased mobility;Postural dysfunction;Difficulty walking   Rehab Potential Good   PT Frequency 2x / week   PT  Treatment/Interventions Aquatic Therapy;Therapeutic exercise;Gait training   Consulted and Agree with Plan of Care Patient        Problem List Patient Active Problem List   Diagnosis Date Noted  . Pancreatitis 01/17/2012    Zamauri Nez, PT 08/10/2014, 11:11 AM  [facility] [address], [place], [address] Phone: [phone]   Fax:  [phone]

## 2014-08-12 ENCOUNTER — Ambulatory Visit: Payer: 59 | Admitting: Physical Therapy

## 2014-08-12 DIAGNOSIS — M4806 Spinal stenosis, lumbar region: Secondary | ICD-10-CM | POA: Diagnosis not present

## 2014-08-12 DIAGNOSIS — R262 Difficulty in walking, not elsewhere classified: Secondary | ICD-10-CM

## 2014-08-12 DIAGNOSIS — M48061 Spinal stenosis, lumbar region without neurogenic claudication: Secondary | ICD-10-CM

## 2014-08-12 NOTE — Therapy (Signed)
[facility] [address] [place], [address] Phone: [phone]   Fax:  [phone]  Physical Therapy Treatment  Patient Details  Name: Joseph Larson MRN: [id number] Date of Birth: 12/07/1955 Referring Provider:  No ref. provider found  Encounter Date: 08/12/2014      PT End of Session - 08/12/14 0856    Visit Number 6   PT Start Time 0800   PT Stop Time 0844   PT Time Calculation (min) 44 min   Activity Tolerance Patient tolerated treatment well   Behavior During Therapy [facility] for tasks assessed/performed      Past Medical History  Diagnosis Date  . Pancreatitis   . GERD (gastroesophageal reflux disease)   . Anxiety   . Rhinitis     chronic  . ED (erectile dysfunction)   . Arthritis     osteoarthritis    Past Surgical History  Procedure Laterality Date  . Eus  01/17/2012    Procedure: UPPER ENDOSCOPIC ULTRASOUND (EUS) LINEAR;  Surgeon: Milus Banister, MD;  Location: [facility];  Service: Endoscopy;  Laterality: N/A;  radial linear     There were no vitals filed for this visit.  Visit Diagnosis:  Spinal stenosis of lumbar region  Difficulty walking      Subjective Assessment - 08/12/14 0850    Subjective Patient states he is feeling better today than he has in a while. He even cut the grass last night and that did not aggrivate his back.    Limitations Sitting;Standing;Lifting;Walking;House hold activities   Currently in Pain? Yes   Pain Location Back   Pain Orientation Posterior   Pain Descriptors / Indicators Dull   Pain Type Chronic pain   Pain Onset More than a month ago   Pain Frequency Constant   Multiple Pain Sites No                     Adult Aquatic Therapy - 08/12/14 0852    Aquatic Therapy Subjective   Subjective Patient reports feeling better than he has in a while.    Treatment   Gait Patient ambulated forward x 8 laps, sidestepping x 4 laps,  backward x 4 laps,     Exercises Standing exercises to include: bilateral marching, hip abduction, hip extension, knee to elbow x 2 min each   Specific Exercises Hip/Low Back   Hip/Low Back push/pull with kickboard and lumbar rotation with kickboard x 2 min each for core strengthening.. Lateral glide stretch 2x20 sec each side.                    PT Education - 08/12/14 0856    Education provided Yes   Person(s) Educated Patient   Methods Demonstration;Explanation;Verbal cues   Comprehension Returned demonstration;Verbal cues required             PT Long Term Goals - 08/03/14 1048    PT LONG TERM GOAL #1   Title (p) Pt will improve mODI score by at least 6 points as a demonstration of improved function.   Time (p) 5   Period (p) Weeks   Status (p) On-going   PT LONG TERM GOAL #2   Title (p) Pt will be I with HEP to improve ability to take showers, don and doff clothes with less pain.   Time (p) 5   Period (p) Weeks   Status (p) On-going   PT LONG TERM  GOAL #3   Title (p) Pt will have a decr. in low back pain to 4/10 or less at worst to improve ability to don and doff pants and socks as well as to improve ability to pick up items from the floor.   Time (p) 5   Period (p) Weeks   Status (p) On-going   PT LONG TERM GOAL #4   Title (p) Pt will improve B hpi ext. ROM to at least 0 degr. to improve ability to perform tasks with less back pain   Time (p) 5   Period (p) Weeks   Status (p) On-going               Plan - 08/12/14 0857    Clinical Impression Statement Patient continues to have pain intermittently. Has constant ache, but worsens at times. Pt seems to be making some progress with mobility. He will be re-assessed in clinic next week.    Pt will benefit from skilled therapeutic intervention in order to improve on the following deficits Pain;Decreased activity tolerance;Difficulty walking;Impaired flexibility   Rehab Potential Good   PT Frequency 2x  / week   PT Treatment/Interventions Aquatic Therapy;Therapeutic exercise   PT Next Visit Plan re-assessment in clinic by evaluating therapist.   Consulted and Agree with Plan of Care Patient        Problem List Patient Active Problem List   Diagnosis Date Noted  . Pancreatitis 01/17/2012    Jeanise Durfey, PT 08/12/2014, 9:00 AM  [facility] [address] [place], [address] Phone: [phone]   Fax:  [phone]

## 2014-08-16 ENCOUNTER — Ambulatory Visit: Payer: 59

## 2014-08-16 DIAGNOSIS — M48061 Spinal stenosis, lumbar region without neurogenic claudication: Secondary | ICD-10-CM

## 2014-08-16 DIAGNOSIS — R262 Difficulty in walking, not elsewhere classified: Secondary | ICD-10-CM

## 2014-08-16 DIAGNOSIS — M4806 Spinal stenosis, lumbar region: Secondary | ICD-10-CM | POA: Diagnosis not present

## 2014-08-16 NOTE — Patient Instructions (Signed)
Gave supine L hip extension isometrics in hooklying position as part of his HEP (10x3 with 5 second holds). Patient demonstrated and verbalized understanding.   Improved exercise technique, movement at target joints, use of target muscles after mod verbal, visual, tactile cues.

## 2014-08-16 NOTE — Therapy (Signed)
[facility] [address] [place], [address] Phone: [phone]   Fax:  [phone]  Physical Therapy Treatment Physical Therapy Progress Note  Patient Details  Name: Joseph Larson MRN: [id number] Date of Birth: 1956-01-26 Referring Provider:  Priscille Kluver, *  Encounter Date: 08/16/2014      PT End of Session - 08/16/14 0823    Visit Number 7   PT Start Time 0820   PT Stop Time 0948   PT Time Calculation (min) 88 min   Activity Tolerance Patient tolerated treatment well   Behavior During Therapy [facility] for tasks assessed/performed      Past Medical History  Diagnosis Date  . Pancreatitis   . GERD (gastroesophageal reflux disease)   . Anxiety   . Rhinitis     chronic  . ED (erectile dysfunction)   . Arthritis     osteoarthritis    Past Surgical History  Procedure Laterality Date  . Eus  01/17/2012    Procedure: UPPER ENDOSCOPIC ULTRASOUND (EUS) LINEAR;  Surgeon: Milus Banister, MD;  Location: [facility];  Service: Endoscopy;  Laterality: N/A;  radial linear     There were no vitals filed for this visit.  Visit Diagnosis:  Spinal stenosis of lumbar region - Plan: PT plan of care cert/re-cert  Difficulty walking - Plan: PT plan of care cert/re-cert      Subjective Assessment - 08/16/14 0824    Subjective Patient states 1-2/10 back pain currently. Back is doing better after mowing his lawn. 2/10 before mowing, 2.5/10 after mowing. Patient states that after his follow-up appointment for land therapy previously, he had a spell when he was tilting to his left side and had a diffiicult time standing up straight.    Limitations Sitting;Standing;Lifting;Walking;House hold activities   Patient Stated Goals Decrease back pain, improve mobility   Currently in Pain? Yes   Pain Score 2    Pain Location Back   Pain Orientation Posterior   Pain Descriptors / Indicators Dull   Pain Type  Chronic pain   Pain Onset More than a month ago   Pain Frequency Constant   Multiple Pain Sites No          [facility] PT Assessment - 08/16/14 1938    Observation/Other Assessments   Modified [place] 44% (severe disability)            [facility] Adult PT Treatment/Exercise - 08/16/14 0940    Knee/Hip Exercises: Supine   Other Supine Knee Exercises Directed patient with supine R hip flexion 10x, then 10x3 half way; supine L hip extension (leg straight isometrics) 10x3 with 5 second holds, then with knee in hooklying position 10x2 with 5 seconds.    Other Supine Knee Exercises Directed patient with manually resisted clam shell, prone glute max extension 1-2x each way for each LE. Reviewed progress/current status with LE strength with patient.    Knee/Hip Exercises: Sidelying   Other Sidelying Knee Exercises clam shells 10x2 (no weight) each LE   Manual Therapy   Manual therapy comments L S/L soft tissue mobilization to R piriformis.             PT Education - 08/16/14 1946    Education provided Yes   Education Details Ther-ex, progress/current status with LE strength, HEP   Person(s) Educated Patient   Methods Explanation;Demonstration;Tactile cues;Verbal cues   Comprehension Verbalized understanding          PT Long Term  Goals - 08/16/14 1256    PT LONG TERM GOAL #1   Title Pt will improve mODI score by at least 6 points as a demonstration of improved function.   Time 6   Period Weeks   Status Achieved   PT LONG TERM GOAL #2   Title Pt will be I with HEP to improve ability to take showers, don and doff clothes with less pain.   Time 6   Period Weeks   Status On-going   PT LONG TERM GOAL #3   Title Pt will have a decr. in low back pain to 4/10 or less at worst to improve ability to don and doff pants and socks as well as to improve ability to pick up items from the floor.   Time 6   Period Weeks   Status On-going   PT LONG TERM GOAL #4   Title Pt will improve B hip  ext. ROM to at least 0 degr. to improve ability to perform tasks with less back pain   Time 5   Period Weeks   Status Achieved          Plan - 08/16/14 0825    Clinical Impression Statement MMT strength: 4+/5 bilateral hip abduction in clam shell position, 4-/5  bilateral glute max extension. Hip extension AROM in supine L: 0 degrees, R: 0 degrees. Symptoms increased to 2.5/10 after supine L hip extension isometric exercise with leg straight. Decreased to 2/10 after supine L hip extension isometrics with LE in hooklying position with posterior nutation pressure to L innominate by therapist . Patient demonstrates improved hip extension ROM, R hip abduction strength and ability to perform functional tasks such as mowing the lawn as well as improved Modified Oswestry Low Back Pain Questionnaire score from 56% to 44 % since initial evaluation. Continue skilled PT services 1x/wk for 5-6 weeks to decrease pain, improve lumbopelvic positioning, decrease pressure to low back, improve posture, and function.    Pt will benefit from skilled therapeutic intervention in order to improve on the following deficits Pain;Decreased activity tolerance;Difficulty walking;Impaired flexibility   Rehab Potential Good   PT Frequency 1x / week  1-2x/week   PT Duration 6 weeks  5 visits   PT Treatment/Interventions Aquatic Therapy;Therapeutic exercise;Electrical Stimulation;Moist Heat;Iontophoresis '4mg'$ /ml Dexamethasone;ADLs/Self Care Home Management;Traction;Ultrasound;Gait training;Stair training;Therapeutic activities;Balance training;Neuromuscular re-education;Patient/family education;Manual techniques;Passive range of motion   PT Next Visit Plan aqua theray with land therapy follow- up appointments    Consulted and Agree with Plan of Care Patient        Problem List Patient Active Problem List   Diagnosis Date Noted  . Pancreatitis 01/17/2012    Thank you for your referral.   Joneen Boers PT,  DPT 08/16/2014, 8:04 PM  [facility] [address], [place], [address] Phone: [phone]   Fax:  [phone]

## 2014-08-18 ENCOUNTER — Encounter: Payer: Self-pay | Admitting: *Deleted

## 2014-08-18 ENCOUNTER — Other Ambulatory Visit: Payer: Self-pay | Admitting: *Deleted

## 2014-08-18 DIAGNOSIS — F419 Anxiety disorder, unspecified: Secondary | ICD-10-CM | POA: Insufficient documentation

## 2014-08-18 DIAGNOSIS — K469 Unspecified abdominal hernia without obstruction or gangrene: Secondary | ICD-10-CM | POA: Insufficient documentation

## 2014-08-18 DIAGNOSIS — I209 Angina pectoris, unspecified: Secondary | ICD-10-CM | POA: Insufficient documentation

## 2014-08-18 DIAGNOSIS — Z789 Other specified health status: Secondary | ICD-10-CM | POA: Insufficient documentation

## 2014-08-18 DIAGNOSIS — G43909 Migraine, unspecified, not intractable, without status migrainosus: Secondary | ICD-10-CM | POA: Insufficient documentation

## 2014-08-18 DIAGNOSIS — F17200 Nicotine dependence, unspecified, uncomplicated: Secondary | ICD-10-CM | POA: Insufficient documentation

## 2014-08-18 DIAGNOSIS — J310 Chronic rhinitis: Secondary | ICD-10-CM | POA: Insufficient documentation

## 2014-08-18 DIAGNOSIS — Z7289 Other problems related to lifestyle: Secondary | ICD-10-CM | POA: Insufficient documentation

## 2014-08-18 DIAGNOSIS — I10 Essential (primary) hypertension: Secondary | ICD-10-CM | POA: Insufficient documentation

## 2014-08-18 DIAGNOSIS — K224 Dyskinesia of esophagus: Secondary | ICD-10-CM | POA: Insufficient documentation

## 2014-08-18 DIAGNOSIS — M1990 Unspecified osteoarthritis, unspecified site: Secondary | ICD-10-CM | POA: Insufficient documentation

## 2014-08-18 DIAGNOSIS — M542 Cervicalgia: Secondary | ICD-10-CM | POA: Insufficient documentation

## 2014-08-18 DIAGNOSIS — M6208 Separation of muscle (nontraumatic), other site: Secondary | ICD-10-CM | POA: Insufficient documentation

## 2014-08-18 DIAGNOSIS — M545 Low back pain, unspecified: Secondary | ICD-10-CM | POA: Insufficient documentation

## 2014-08-18 DIAGNOSIS — K649 Unspecified hemorrhoids: Secondary | ICD-10-CM | POA: Insufficient documentation

## 2014-08-18 DIAGNOSIS — M792 Neuralgia and neuritis, unspecified: Secondary | ICD-10-CM | POA: Insufficient documentation

## 2014-08-18 DIAGNOSIS — N529 Male erectile dysfunction, unspecified: Secondary | ICD-10-CM | POA: Insufficient documentation

## 2014-08-18 DIAGNOSIS — K219 Gastro-esophageal reflux disease without esophagitis: Secondary | ICD-10-CM | POA: Insufficient documentation

## 2014-08-18 DIAGNOSIS — K859 Acute pancreatitis without necrosis or infection, unspecified: Secondary | ICD-10-CM | POA: Insufficient documentation

## 2014-08-18 DIAGNOSIS — E785 Hyperlipidemia, unspecified: Secondary | ICD-10-CM | POA: Insufficient documentation

## 2014-08-19 ENCOUNTER — Ambulatory Visit: Payer: 59 | Admitting: Physical Therapy

## 2014-08-19 DIAGNOSIS — M4806 Spinal stenosis, lumbar region: Secondary | ICD-10-CM | POA: Diagnosis not present

## 2014-08-19 DIAGNOSIS — R262 Difficulty in walking, not elsewhere classified: Secondary | ICD-10-CM

## 2014-08-19 DIAGNOSIS — M48061 Spinal stenosis, lumbar region without neurogenic claudication: Secondary | ICD-10-CM

## 2014-08-19 NOTE — Therapy (Signed)
[facility] [address] [place], [address] Phone: [phone]   Fax:  [phone]  Physical Therapy Treatment  Patient Details  Name: Joseph Larson MRN: [id number] Date of Birth: Jan 20, 1956 Referring Provider:  Priscille Kluver, *  Encounter Date: 08/19/2014      PT End of Session - 08/19/14 1213    Visit Number 8   Number of Visits 12   Date for PT Re-Evaluation 08/19/14   PT Start Time 6967   PT Stop Time 1125   PT Time Calculation (min) 45 min   Activity Tolerance Patient tolerated treatment well;No increased pain   Behavior During Therapy [facility] for tasks assessed/performed      Past Medical History  Diagnosis Date  . Pancreatitis   . GERD (gastroesophageal reflux disease)   . Anxiety   . Rhinitis     chronic  . ED (erectile dysfunction)   . Arthritis     osteoarthritis    Past Surgical History  Procedure Laterality Date  . Eus  01/17/2012    Procedure: UPPER ENDOSCOPIC ULTRASOUND (EUS) LINEAR;  Surgeon: Milus Banister, MD;  Location: [facility];  Service: Endoscopy;  Laterality: N/A;  radial linear     There were no vitals filed for this visit.  Visit Diagnosis:  Spinal stenosis of lumbar region  Difficulty walking      Subjective Assessment - 08/19/14 1206    Subjective Patient states he had felt good until yesterday and today. Reports that he maily gets irritated when he is sitting and getting jostled ( like on mower, or forklift.)   Limitations Sitting;Lifting;Other (comment)  job duties   Patient Stated Goals Decrease back pain, improve mobility   Pain Location Back   Pain Orientation Right   Pain Descriptors / Indicators Burning   Pain Type Chronic pain   Pain Onset More than a month ago   Pain Frequency Constant   Multiple Pain Sites No                     Adult Aquatic Therapy - 08/19/14 1210    Aquatic Therapy Subjective   Subjective Patient  reports contstant pain that just flares up occasionally.   Treatment   Gait Patient performed forward ambulation x 6 laps, side stepping x 3 laps, backward walking x 6 laps, lunge walking x 2 laps   Exercises Standing exercises to include squats, marching, lumbar rotation, hip abduction, hip extension x 2 min each   Specific Exercises Hip/Low Back   Hip/Low Back push/pull with kickboard  x 2 min  for core strengthening.. Lateral glide stretch 2x20 sec each side.                    PT Education - 08/19/14 1212    Education provided Yes   Education Details aquatic exercises, proper form, possibly using back brace during aggrivating activities to increase support and to decrease movement of spine.   Person(s) Educated Patient   Methods Explanation;Demonstration   Comprehension Verbal cues required;Returned demonstration;Verbalized understanding             PT Long Term Goals - 08/16/14 1256    PT LONG TERM GOAL #1   Title Pt will improve mODI score by at least 6 points as a demonstration of improved function.   Time 6   Period Weeks   Status Achieved   PT LONG TERM GOAL #2  Title Pt will be I with HEP to improve ability to take showers, don and doff clothes with less pain.   Time 6   Period Weeks   Status On-going   PT LONG TERM GOAL #3   Title Pt will have a decr. in low back pain to 4/10 or less at worst to improve ability to don and doff pants and socks as well as to improve ability to pick up items from the floor.   Time 6   Period Weeks   Status On-going   PT LONG TERM GOAL #4   Title Pt will improve B hip ext. ROM to at least 0 degr. to improve ability to perform tasks with less back pain   Time 5   Period Weeks   Status Achieved               Plan - 08/19/14 1216    Clinical Impression Statement Patient reports constant pain that just flares up and changes occasionally. Patient seems to have a decrease in pain with aquatic activities. No  increased pain during session.    Pt will benefit from skilled therapeutic intervention in order to improve on the following deficits Decreased activity tolerance;Pain;Difficulty walking   Rehab Potential Good   PT Frequency 1x / week   PT Duration 6 weeks   PT Treatment/Interventions Aquatic Therapy;Gait training;Therapeutic exercise;Patient/family education   PT Next Visit Plan aqua theray with land therapy follow- up appointments    Consulted and Agree with Plan of Care Patient        Problem List Patient Active Problem List   Diagnosis Date Noted  . Acute inflammation of the pancreas 08/18/2014  . Alcohol drinker 08/18/2014  . Angina pectoris 08/18/2014  . Anxiety 08/18/2014  . Cervical pain 08/18/2014  . Chronic rhinitis 08/18/2014  . Diastasis recti 08/18/2014  . 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  . 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 Acid reflux 08/18/2014  . Headache, migraine 08/18/2014  . Abdominal hernia 08/18/2014  . Hemorrhoid 08/18/2014  . HLD (hyperlipidemia) 08/18/2014  . BP (high blood pressure) 08/18/2014  . LBP (low back pain) 08/18/2014  . Nerve pain 08/18/2014  . Arthritis, degenerative 08/18/2014  . Compulsive tobacco user syndrome 08/18/2014  . Lumbar canal stenosis 07/07/2014  . Neuritis or radiculitis due to rupture of lumbar intervertebral disc 05/14/2014  . DDD (degenerative disc disease), lumbar 05/03/2014  . Low back strain 05/03/2014  . Abnormal prostate specific antigen 01/25/2014  . Pancreatitis 01/17/2012    Ilynn Stauffer, PT 08/19/2014, 12:19 PM  [facility] [address], [place], [address] Phone: [phone]   Fax:  [phone]

## 2014-08-31 ENCOUNTER — Ambulatory Visit: Payer: 59 | Attending: Orthopedic Surgery

## 2014-08-31 ENCOUNTER — Encounter: Payer: Self-pay | Admitting: Physical Therapy

## 2014-08-31 DIAGNOSIS — M4806 Spinal stenosis, lumbar region: Secondary | ICD-10-CM | POA: Diagnosis not present

## 2014-08-31 DIAGNOSIS — R262 Difficulty in walking, not elsewhere classified: Secondary | ICD-10-CM | POA: Insufficient documentation

## 2014-08-31 NOTE — Therapy (Signed)
[facility] [address] [place], [address] Phone: [phone]   Fax:  [phone]  Physical Therapy Treatment  Patient Details  Name: Joseph Larson MRN: [id number] Date of Birth: Feb 15, 1956 Referring Provider:  Priscille Kluver, *  Encounter Date: 08/31/2014      PT End of Session - 08/31/14 0854    Visit Number 9   Number of Visits 17   Date for PT Re-Evaluation 09/27/14   PT Start Time 0730   PT Stop Time 0813   PT Time Calculation (min) 43 min   Activity Tolerance Patient tolerated treatment well;No increased pain   Behavior During Therapy [facility] for tasks assessed/performed      Past Medical History  Diagnosis Date  . Pancreatitis   . GERD (gastroesophageal reflux disease)   . Anxiety   . Rhinitis     chronic  . ED (erectile dysfunction)   . Arthritis     osteoarthritis    Past Surgical History  Procedure Laterality Date  . Eus  01/17/2012    Procedure: UPPER ENDOSCOPIC ULTRASOUND (EUS) LINEAR;  Surgeon: Milus Banister, MD;  Location: [facility];  Service: Endoscopy;  Laterality: N/A;  radial linear     There were no vitals filed for this visit.  Visit Diagnosis:  Spinal stenosis of lumbar region  Difficulty walking      Subjective Assessment - 08/31/14 0845    Subjective Pt reports that he is doing well today. He has seen improvement with physical therapy and reports that aquatic therapy has been very beneficial. He has a HEP but states that he frequently is unable to complete on the weekends due to time constraints related to yardwork. No specific questions or concerns currently    Limitations Sitting;Lifting   Patient Stated Goals Decrease back pain, improve mobility   Currently in Pain? Yes   Pain Score 2    Pain Location Back   Pain Orientation Right;Left   Pain Type Chronic pain   Pain Onset More than a month ago   Pain Frequency Constant   Multiple Pain Sites No                     Adult Aquatic Therapy - 08/31/14 0847    Aquatic Therapy Subjective   Subjective Patient reports contstant pain that just flares up occasionally.   Treatment   Gait Patient entered/exited pool via stairs; forward ambulation x 4 laps, retro ambulation x 2 laps, and side stepping x 4 laps (alternating direction)   Exercises Standing exercises to include squats, marching, hip abduction, hip extension x 1 min each; forward and lateral step-ups to bottom step;   Specific Exercises Hip/Low Back   Hip/Low Back push/pull with kickboard  x 1 min  for core strengthening, lumbar rotation with kickboard x 1 min; lumbar stretch with feet on wall; seated flutter kicks, bicycle kicks, and hip abd/add kick; seated HS stretches 1 min x 2;                     PT Education - 08/31/14 0853    Education provided Yes   Education Details Continue HEP; education regarding proper form during all exercises including active corrections throughout. Pain monitored throughout session   Person(s) Educated Patient   Methods Explanation;Demonstration;Verbal cues   Comprehension Verbalized understanding;Returned demonstration             PT Long Term Goals -  08/16/14 1256    PT LONG TERM GOAL #1   Title Pt will improve mODI score by at least 6 points as a demonstration of improved function.   Time 6   Period Weeks   Status Achieved   PT LONG TERM GOAL #2   Title Pt will be I with HEP to improve ability to take showers, don and doff clothes with less pain.   Time 6   Period Weeks   Status On-going   PT LONG TERM GOAL #3   Title Pt will have a decr. in low back pain to 4/10 or less at worst to improve ability to don and doff pants and socks as well as to improve ability to pick up items from the floor.   Time 6   Period Weeks   Status On-going   PT LONG TERM GOAL #4   Title Pt will improve B hip ext. ROM to at least 0 degr. to improve ability to perform tasks with  less back pain   Time 5   Period Weeks   Status Achieved               Plan - 08/31/14 0854    Clinical Impression Statement Patient denies pain with all activities today. He does reports "stretching" of low back with lumbar rail flexion stretches. Overall pt is making good progress with aquatic therapy demonstrating improved activity tolerance without increase in pain.    Pt will benefit from skilled therapeutic intervention in order to improve on the following deficits Decreased activity tolerance;Pain;Difficulty walking   PT Next Visit Plan aqua theray with land therapy follow- up appointments    PT Home Exercise Plan Continue current HEP   Consulted and Agree with Plan of Care Patient        Problem List Patient Active Problem List   Diagnosis Date Noted  . Acute inflammation of the pancreas 08/18/2014  . Alcohol drinker 08/18/2014  . Angina pectoris 08/18/2014  . Anxiety 08/18/2014  . Cervical pain 08/18/2014  . Chronic rhinitis 08/18/2014  . Diastasis recti 08/18/2014  . 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  . 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 Acid reflux 08/18/2014  . Headache, migraine 08/18/2014  . Abdominal hernia 08/18/2014  . Hemorrhoid 08/18/2014  . HLD (hyperlipidemia) 08/18/2014  . BP (high blood pressure) 08/18/2014  . LBP (low back pain) 08/18/2014  . Nerve pain 08/18/2014  . Arthritis, degenerative 08/18/2014  . Compulsive tobacco user syndrome 08/18/2014  . Lumbar canal stenosis 07/07/2014  . Neuritis or radiculitis due to rupture of lumbar intervertebral disc 05/14/2014  . DDD (degenerative disc disease), lumbar 05/03/2014  . Low back strain 05/03/2014  . Abnormal prostate specific antigen 01/25/2014  . Pancreatitis 01/17/2012   Phillips Grout PT, DPT Huprich,Jason 08/31/2014, 8:57 AM  [facility] [address] [place], [address] Phone:  [phone]   Fax:  [phone]

## 2014-09-02 ENCOUNTER — Encounter: Payer: Self-pay | Admitting: *Deleted

## 2014-09-05 ENCOUNTER — Other Ambulatory Visit: Payer: Self-pay | Admitting: Urology

## 2014-09-05 DIAGNOSIS — N401 Enlarged prostate with lower urinary tract symptoms: Secondary | ICD-10-CM

## 2014-09-05 DIAGNOSIS — N138 Other obstructive and reflux uropathy: Secondary | ICD-10-CM

## 2014-09-06 ENCOUNTER — Ambulatory Visit (INDEPENDENT_AMBULATORY_CARE_PROVIDER_SITE_OTHER): Payer: 59 | Admitting: Urology

## 2014-09-06 ENCOUNTER — Encounter: Payer: Self-pay | Admitting: Urology

## 2014-09-06 VITALS — BP 158/77 | HR 72 | Ht 72.0 in | Wt 200.3 lb

## 2014-09-06 DIAGNOSIS — R972 Elevated prostate specific antigen [PSA]: Secondary | ICD-10-CM | POA: Diagnosis not present

## 2014-09-06 DIAGNOSIS — N401 Enlarged prostate with lower urinary tract symptoms: Secondary | ICD-10-CM | POA: Insufficient documentation

## 2014-09-06 DIAGNOSIS — N528 Other male erectile dysfunction: Secondary | ICD-10-CM

## 2014-09-06 DIAGNOSIS — N138 Other obstructive and reflux uropathy: Secondary | ICD-10-CM | POA: Insufficient documentation

## 2014-09-06 LAB — BLADDER SCAN AMB NON-IMAGING: SCAN RESULT: 38

## 2014-09-06 NOTE — Progress Notes (Signed)
09/06/2014 10:59 AM   Joseph Larson 03-07-1956 [id number]  Referring provider: Idelle Crouch, MD [place] [facility] [place], [place] [address]  Chief Complaint  Patient presents with  . 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 Doy Hutching, for further follow up.  His IPSS score today is 8/3 (moderate).  When asked about his "mixed" scoring, he stated he has always had problems urinating.  For as long as he can remember, he would stand at the urinals while other men would just urinate and go on about their business.  He was on finasteride and tamsulosin, but he has been off them for about 6 months.  He has not noticed a worsening of his urinary symptoms since discontinuing the medications.  He currently admits to intermittency, urgency, weak stream and nocturia.  He denies any hematuria, dysuria, suprapubic pain, fevers, chills, nausea or vomiting.  His brother has PCa.   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 Not at All     How often have you had to urinate less than every two hours? Not at All     How often have you found you stopped and started again several times when you urinated? Less than 1 in 5 times     How often have you found it difficult to postpone urination? About half the time     How often have you had a weak urinary stream? About half the time     How often have you had to strain to start urination? Not at All     How many times did you typically get up at night to urinate? 1 Time     Total IPSS Score 8     Quality of Life due to  urinary symptoms   If you were to spend the rest of your life with your urinary condition just the way it is now how would you feel about that? Mixed       Patient's SHIM score is 20 (mild).  He is not currently very sexually active due to his wife's medical condition.  He states they have sex "once in a blue moon."  When he does have intercourse, it is satisfactory.       SHIM      09/06/14 1015       SHIM: Over the last 6 months:   How do you rate your confidence that you could get and keep an erection? Moderate     When you had erections with sexual stimulation, how often were your erections hard enough for penetration (entering your partner)? Most Times (much more than half the time)     During sexual intercourse, how often were you able to maintain your erection after you had penetrated (entered) your partner? Slightly Difficult     During sexual intercourse, how difficult was it to maintain your erection to completion  of intercourse? Slightly Difficult     When you attempted sexual intercourse, how often was it satisfactory for you? Not Difficult     SHIM Total Score   SHIM 20           PMH: Past Medical History  Diagnosis Date  . Pancreatitis   . GERD (gastroesophageal reflux disease)   . Anxiety   . Rhinitis     chronic  . ED (erectile dysfunction)   . Arthritis     osteoarthritis  . Neuralgia   . Angina pectoris   . Rectus diastasis   . Hypertension   . Hyperlipidemia   . Esophageal spasm   . Elevated PSA   . BPH (benign prostatic hyperplasia)   . Erectile dysfunction   . Coronary atherosclerosis     Surgical History: Past Surgical History  Procedure Laterality Date  . Eus  01/17/2012    Procedure: UPPER ENDOSCOPIC ULTRASOUND (EUS) LINEAR;  Surgeon: Milus Banister, MD;  Location: [facility];  Service: Endoscopy;  Laterality: N/A;  radial linear   . Cardiac catheterization  2010  . Hernia repair      Right inguinal  . Excision morton's neuroma       Left Foot    Home Medications:    Medication List       This list is accurate as of: 09/06/14 10:59 AM.  Always use your most recent med list.               cyclobenzaprine 10 MG tablet  Commonly known as:  FLEXERIL     finasteride 5 MG tablet  Commonly known as:  PROSCAR  Take 5 mg by mouth daily.     metaxalone 800 MG tablet  Commonly known as:  SKELAXIN     nabumetone 500 MG tablet  Commonly known as:  RELAFEN  Take 500 mg by mouth daily.     pantoprazole 40 MG tablet  Commonly known as:  PROTONIX  Take 40 mg by mouth 2 (two) times daily.     sildenafil 100 MG tablet  Commonly known as:  VIAGRA  Take by mouth.     tamsulosin 0.4 MG Caps capsule  Commonly known as:  FLOMAX  Take 0.4 mg by mouth.        Allergies: No Known Allergies  Family History: Family History  Problem Relation Age of Onset  . Prostate cancer Brother     Social History:  reports that he has been smoking Cigarettes.  He has a 20 pack-year smoking history. He does not have any smokeless tobacco history on file. He reports that he does not drink alcohol or use illicit drugs.  ROS: Urological Symptom Review  Patient is experiencing the following symptoms: Hard to postpone urination Get up at night to urinate Stream starts and stops Weak stream   Review of Systems  Gastrointestinal (upper)  : Negative for upper GI symptoms  Gastrointestinal (lower) : Negative for lower GI symptoms  Constitutional : Fatigue  Skin: Negative for skin symptoms  Eyes: Negative for eye symptoms  Ear/Nose/Throat : Negative for Ear/Nose/Throat symptoms  Hematologic/Lymphatic: Negative for Hematologic/Lymphatic symptoms  Cardiovascular : Negative for cardiovascular symptoms  Respiratory : Negative for respiratory symptoms  Endocrine: Negative for endocrine symptoms  Musculoskeletal: Back pain Joint pain  Neurological: Negative for neurological symptoms  Psychologic: Negative  for psychiatric symptoms   Physical Exam: BP 158/77 mmHg  Pulse 72  Ht 6' (1.829 m)  Wt 200 lb 4.8 oz (  90.855 kg)  BMI 27.16 kg/m2  GU: Patient with a circumcised phallus.  Urethral meatus is patent.  No penile discharge. No penile lesions or rashes. Scrotum without lesions, cysts, rashes and/or edema.  Testicles are located scrotally bilaterally. No masses are appreciated in the testicles. Left and right epididymis are normal.  Rectal: Patient with  normal sphincter tone. Perineum without scarring or rashes. No rectal masses are appreciated. Prostate is approximately 50 grams, no nodules are appreciated. Seminal vesicles are normal.   Laboratory Data: Lab Results  Component Value Date   WBC 11.8* 09/07/2012   HGB 14.4 09/07/2012   HCT 41.2 09/07/2012   MCV 87 09/07/2012   PLT 181 09/07/2012    Lab Results  Component Value Date   CREATININE 1.65* 09/07/2012    No results found for: PSA  No results found for: TESTOSTERONE  No results found for: HGBA1C  Urinalysis No results found for: COLORURINE, APPEARANCEUR, LABSPEC, PHURINE, GLUCOSEU, HGBUR, Margo Common, UROBILINOGEN, NITRITE, LEUKOCYTESUR  Pertinent Imaging: Results for GRAYLAND, DAISEY (MRN [id number]) as of 09/06/2014 10:52  Ref. Range 09/06/2014 10:21  Scan Result Unknown 38    Assessment & Plan:   1. 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  His PSA's have remained lower than 5.26 ng/mL.   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  Follow up will be based on PSA results.  2. BPH with obstruction/lower urinary tract symptoms- Patient's IPSS score is 8/3 (moderate).  His PVR 38.  His DRE demonstrates enlargement.  He is mixed about his urinary symptoms, but he did not find any improvement with tamsulosin and  finasteride.  He does not want to restart these medications at this time and he does not desire any other treatments for his LUTS.  He will follow up in 6 months for a PSA, DRE, PVR and an IPSS.    3. Erectile dysfunction- Patient's SHIM score is 20 (mild ED).  He is having sex infrequently.  But when he does, he is finding it satisfactory. He is also taking Viagra prescribed by his primary care physician.    No Follow-up on file.  Zara Council, [facility] [address] [place], [place] [address] [phone]

## 2014-09-07 ENCOUNTER — Ambulatory Visit: Payer: 59 | Admitting: Physical Therapy

## 2014-09-07 ENCOUNTER — Telehealth: Payer: Self-pay

## 2014-09-07 DIAGNOSIS — M48061 Spinal stenosis, lumbar region without neurogenic claudication: Secondary | ICD-10-CM

## 2014-09-07 DIAGNOSIS — R262 Difficulty in walking, not elsewhere classified: Secondary | ICD-10-CM

## 2014-09-07 DIAGNOSIS — M4806 Spinal stenosis, lumbar region: Secondary | ICD-10-CM | POA: Diagnosis not present

## 2014-09-07 LAB — PSA: Prostate Specific Ag, Serum: 4.5 ng/mL — ABNORMAL HIGH (ref 0.0–4.0)

## 2014-09-07 NOTE — Therapy (Signed)
[place] [facility] [address], [place], [address] Phone: [phone]   Fax:  [phone]  Physical Therapy Treatment  Patient Details  Name: Joseph Larson MRN: [id number] Date of Birth: 06/16/1955 Referring Provider:  Priscille Kluver, *  Encounter Date: 09/07/2014      PT End of Session - 09/07/14 0831    Visit Number 10   Number of Visits 17   Date for PT Re-Evaluation 09/27/14   PT Start Time 0735   PT Stop Time 0815   PT Time Calculation (min) 40 min   Activity Tolerance Patient tolerated treatment well;No increased pain   Behavior During Therapy [facility] for tasks assessed/performed      Past Medical History  Diagnosis Date  . Pancreatitis   . GERD (gastroesophageal reflux disease)   . Anxiety   . Rhinitis     chronic  . ED (erectile dysfunction)   . Arthritis     osteoarthritis  . Neuralgia   . Angina pectoris   . Rectus diastasis   . Hypertension   . Hyperlipidemia   . Esophageal spasm   . Elevated PSA   . BPH (benign prostatic hyperplasia)   . Erectile dysfunction   . Coronary atherosclerosis     Past Surgical History  Procedure Laterality Date  . Eus  01/17/2012    Procedure: UPPER ENDOSCOPIC ULTRASOUND (EUS) LINEAR;  Surgeon: Milus Banister, MD;  Location: [facility];  Service: Endoscopy;  Laterality: N/A;  radial linear   . Cardiac catheterization  2010  . Hernia repair      Right inguinal  . Excision morton's neuroma      Left Foot    There were no vitals filed for this visit.  Visit Diagnosis:  Spinal stenosis of lumbar region  Difficulty walking      Subjective Assessment - 09/07/14 0823    Subjective Patient reports to me that pain level is no better or worse. Continues to have constant pain that he rates at a 2/10 currently.    Limitations Sitting;Standing;Walking   Patient Stated Goals Decrease back pain, improve mobility   Currently in Pain? Yes   Pain  Score 2    Pain Location Back   Pain Orientation Right;Left   Pain Descriptors / Indicators Burning   Pain Type Chronic pain   Pain Onset More than a month ago   Pain Frequency Constant   Multiple Pain Sites No                     Adult Aquatic Therapy - 09/07/14 0825    Aquatic Therapy Subjective   Subjective Patient reports constant pain that is at a 2/10. Sometimes worse. Reports he went to the beach this weekend with his grandson and was able to throw ball, go into water, etc.    Treatment   Gait Patient entered/exited pool via stairs; forward ambulation x 3 laps, retro ambulation x 3 laps, marching gait x 1 lap, and side stepping x 3 laps    Exercises Standing trunk twists with kickboard, push pull with kickbaord, hip abduction, marching, knee to elbow, lunge stance with trunk rotation to each side all x 2 min.   Specific Exercises Hip/Low Back   Hip/Low Back standing lateral glide to wall x3 with 15 sec hold to each side, lumbar extension x 3 with 20 sec hold  PT Education - 09/07/14 0830    Education provided Yes   Education Details proper form with exercises, pain monitored throughout session.    Person(s) Educated Patient   Methods Explanation;Demonstration;Tactile cues;Verbal cues   Comprehension Verbalized understanding;Returned demonstration;Verbal cues required             PT Long Term Goals - 08/16/14 1256    PT LONG TERM GOAL #1   Title Pt will improve mODI score by at least 6 points as a demonstration of improved function.   Time 6   Period Weeks   Status Achieved   PT LONG TERM GOAL #2   Title Pt will be I with HEP to improve ability to take showers, don and doff clothes with less pain.   Time 6   Period Weeks   Status On-going   PT LONG TERM GOAL #3   Title Pt will have a decr. in low back pain to 4/10 or less at worst to improve ability to don and doff pants and socks as well as to improve ability to pick up  items from the floor.   Time 6   Period Weeks   Status On-going   PT LONG TERM GOAL #4   Title Pt will improve B hip ext. ROM to at least 0 degr. to improve ability to perform tasks with less back pain   Time 5   Period Weeks   Status Achieved               Plan - 09/07/14 0831    Clinical Impression Statement Patient does well with exercises, denies increased pain during session. Reports "tightness" with some activities. Patient does not report significant change in pain since initiation of therapy. Continues to have constant pain.    Pt will benefit from skilled therapeutic intervention in order to improve on the following deficits Decreased activity tolerance;Pain   Rehab Potential Good   PT Frequency 1x / week   PT Duration 6 weeks   PT Treatment/Interventions Aquatic Therapy;Gait training;Therapeutic exercise;Patient/family education   PT Next Visit Plan patient is seeing evaluating therapist next session. Pt is independent with aquatic activities at this time and does not report significant decrease in pain with aquatic activities.    PT Home Exercise Plan Continue current HEP   Consulted and Agree with Plan of Care Patient        Problem List Patient Active Problem List   Diagnosis Date Noted  . 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 Elevated PSA 09/06/2014  . Other male erectile dysfunction 09/06/2014  . Acute inflammation of the pancreas 08/18/2014  . Alcohol drinker 08/18/2014  . Angina pectoris 08/18/2014  . Anxiety 08/18/2014  . Cervical pain 08/18/2014  . Chronic rhinitis 08/18/2014  . Diastasis recti 08/18/2014  . 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  . 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 Acid reflux 08/18/2014  . Headache, migraine 08/18/2014  . Abdominal hernia 08/18/2014  . Hemorrhoid 08/18/2014  . HLD (hyperlipidemia) 08/18/2014  . BP (high blood pressure) 08/18/2014  . LBP (low back pain) 08/18/2014   . Nerve pain 08/18/2014  . Arthritis, degenerative 08/18/2014  . Compulsive tobacco user syndrome 08/18/2014  . Lumbar canal stenosis 07/07/2014  . Neuritis or radiculitis due to rupture of lumbar intervertebral disc 05/14/2014  . DDD (degenerative disc disease), lumbar 05/03/2014  . Low back strain 05/03/2014  . Abnormal prostate specific antigen 01/25/2014  . Pancreatitis 01/17/2012    Shelba Susi, PT  09/07/2014, 8:37 AM  [place] [address] [place], [address] Phone: [phone]   Fax:  [phone]

## 2014-09-07 NOTE — Telephone Encounter (Signed)
-----   Message from Nori Riis, PA-C sent at 09/07/2014  8:54 AM EDT ----- Patient's PSA is stable.  We will see him in 6 months.

## 2014-09-07 NOTE — Telephone Encounter (Signed)
Patient notified, will be in for 6 month follow up in December

## 2014-09-14 ENCOUNTER — Ambulatory Visit: Payer: 59

## 2014-09-14 ENCOUNTER — Ambulatory Visit: Payer: 59 | Admitting: Physical Therapy

## 2014-09-14 DIAGNOSIS — M4806 Spinal stenosis, lumbar region: Secondary | ICD-10-CM | POA: Diagnosis not present

## 2014-09-14 DIAGNOSIS — M48061 Spinal stenosis, lumbar region without neurogenic claudication: Secondary | ICD-10-CM

## 2014-09-14 DIAGNOSIS — R262 Difficulty in walking, not elsewhere classified: Secondary | ICD-10-CM

## 2014-09-14 NOTE — Therapy (Signed)
[place] [facility] [address], [place], [address] Phone: [phone]   Fax:  [phone]  Physical Therapy Treatment  Patient Details  Name: Joseph Larson MRN: [id number] Date of Birth: Feb 16, 1956 Referring Provider:  Priscille Kluver, *  Encounter Date: 09/14/2014      PT End of Session - 09/14/14 0810    Visit Number 11   Number of Visits 17   Date for PT Re-Evaluation 09/27/14   PT Start Time 0811   PT Stop Time 0910   PT Time Calculation (min) 59 min   Activity Tolerance Patient tolerated treatment well;No increased pain   Behavior During Therapy [facility] for tasks assessed/performed      Past Medical History  Diagnosis Date  . Pancreatitis   . GERD (gastroesophageal reflux disease)   . Anxiety   . Rhinitis     chronic  . ED (erectile dysfunction)   . Arthritis     osteoarthritis  . Neuralgia   . Angina pectoris   . Rectus diastasis   . Hypertension   . Hyperlipidemia   . Esophageal spasm   . Elevated PSA   . BPH (benign prostatic hyperplasia)   . Erectile dysfunction   . Coronary atherosclerosis     Past Surgical History  Procedure Laterality Date  . Eus  01/17/2012    Procedure: UPPER ENDOSCOPIC ULTRASOUND (EUS) LINEAR;  Surgeon: Milus Banister, MD;  Location: [facility];  Service: Endoscopy;  Laterality: N/A;  radial linear   . Cardiac catheterization  2010  . Hernia repair      Right inguinal  . Excision morton's neuroma      Left Foot    There were no vitals filed for this visit.  Visit Diagnosis:  Spinal stenosis of lumbar region  Difficulty walking      Subjective Assessment - 09/14/14 0811    Subjective Patient states that his back is at the point where it's feels tight, bruised and sore in low back. Water therapy was fine. Notices improvement. 1.5 to 2/10 back pain currently. Does not have catching anymore. 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 Back does not feel like he's hurting any worse. Normally mowing his lawn makes it much worse. Patient states that he feels like he made improvement with therapy but feels like it plateaued.  Going to see his MD next Thursday.    Limitations Sitting;Standing;Walking   Patient Stated Goals Decrease back pain, improve mobility   Currently in Pain? Yes   Pain Score 2    Pain Location Back   Pain Orientation Right;Left   Pain Type Chronic pain   Pain Onset More than a month ago   Pain Frequency Constant   Multiple Pain Sites No                         [facility] Adult PT Treatment/Exercise - 09/14/14 0821    Exercises   Other Exercises  Directed patient with static sitting on pillow L hip, then with lumbar towel roll R low back, then with lumbar towel roll only R low back. then with both towel and pillow (performed to promote proper lumbopelvic posture; increased time secondary to allow body to adjust to positions; patient was recommended to sit on a chair with pillow under L hip and lumbar towel roll  behind R low back; patient verbalized understanding); seated manually resisted R hip flexion isometrics 10x3 with 5 second holds, seated glute max squeeze 10x3 with 5 second holds, seated transversus abdominis contraction 10x2 with 5 seconds, seated pelvic floor contraction 10x5 seconds.                  PT Education - 09/14/14 1517    Education provided Yes   Education Details ther-ex, HEP   Person(s) Educated Patient   Methods Explanation;Demonstration;Tactile cues;Verbal cues   Comprehension Returned demonstration;Verbalized understanding             PT Long Term Goals - 08/16/14 1256    PT LONG TERM GOAL #1   Title Pt will improve mODI score by at least 6 points as a demonstration of improved function.   Time 6   Period Weeks   Status Achieved   PT LONG TERM GOAL #2   Title Pt will be I  with HEP to improve ability to take showers, don and doff clothes with less pain.   Time 6   Period Weeks   Status On-going   PT LONG TERM GOAL #3   Title Pt will have a decr. in low back pain to 4/10 or less at worst to improve ability to don and doff pants and socks as well as to improve ability to pick up items from the floor.   Time 6   Period Weeks   Status On-going   PT LONG TERM GOAL #4   Title Pt will improve B hip ext. ROM to at least 0 degr. to improve ability to perform tasks with less back pain   Time 5   Period Weeks   Status Achieved               Plan - 09/14/14 6283    Clinical Impression Statement Pt arrived late to session. Decreased back pain to 1/10 after sitting on pillow left side with R lumbar towel roll. Returned to 1.5-2/10 when standing. Back also feels comfortable for pt with support from back of chair ( possible need for strengthening lumbopelvic stabilizing muslces). Patient will benefit from continued physical  therapy to promote lumbopelvic stability, lumbopelvic posture, and function.    Pt will benefit from skilled therapeutic intervention in order to improve on the following deficits Decreased activity tolerance;Pain   Rehab Potential Good   PT Frequency 1x / week   PT Duration 6 weeks   PT Treatment/Interventions Aquatic Therapy;Gait training;Therapeutic exercise;Patient/family education   PT Next Visit Plan lumbopelvic posture, lumbopelvic control   PT Home Exercise Plan Continue current HEP   Consulted and Agree with Plan of Care Patient        Problem List Patient Active Problem List   Diagnosis Date Noted  . 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 Elevated PSA 09/06/2014  . Other male erectile dysfunction 09/06/2014  . Acute inflammation of the pancreas 08/18/2014  . Alcohol drinker 08/18/2014  . Angina pectoris 08/18/2014  . Anxiety 08/18/2014  . Cervical pain 08/18/2014  . Chronic rhinitis 08/18/2014   . Diastasis recti 08/18/2014  . 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  . 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 Acid reflux 08/18/2014  . Headache, migraine 08/18/2014  . Abdominal hernia 08/18/2014  . Hemorrhoid 08/18/2014  . HLD (hyperlipidemia) 08/18/2014  . BP (high blood pressure) 08/18/2014  . LBP (low back pain) 08/18/2014  . Nerve pain 08/18/2014  . Arthritis, degenerative 08/18/2014  .  Compulsive tobacco user syndrome 08/18/2014  . Lumbar canal stenosis 07/07/2014  . Neuritis or radiculitis due to rupture of lumbar intervertebral disc 05/14/2014  . DDD (degenerative disc disease), lumbar 05/03/2014  . Low back strain 05/03/2014  . Abnormal prostate specific antigen 01/25/2014  . Pancreatitis 01/17/2012    Hasaan Radde 09/14/2014, 3:33 PM  [place] [facility] [address] [place], [address] Phone: [phone]   Fax:  [phone]

## 2014-09-14 NOTE — Patient Instructions (Signed)
Pt was recommended to perform seated transversus abdominis contraction and pelvic floor contractions at home throughout the day. Patient verbalized understanding.   Improved exercise technique, movement at target joints, use of target muscles after mod verbal, visual, tactile cues.

## 2014-09-21 ENCOUNTER — Ambulatory Visit: Payer: 59 | Admitting: Physical Therapy

## 2014-10-14 ENCOUNTER — Other Ambulatory Visit: Payer: Self-pay | Admitting: Internal Medicine

## 2014-10-14 DIAGNOSIS — R1310 Dysphagia, unspecified: Secondary | ICD-10-CM

## 2014-10-21 ENCOUNTER — Ambulatory Visit
Admission: RE | Admit: 2014-10-21 | Discharge: 2014-10-21 | Disposition: A | Discharge status: Home / Self Care | Payer: 59 | Source: Ambulatory Visit | Attending: Internal Medicine | Admitting: Internal Medicine

## 2014-10-21 DIAGNOSIS — R1310 Dysphagia, unspecified: Secondary | ICD-10-CM | POA: Diagnosis not present

## 2014-10-25 ENCOUNTER — Other Ambulatory Visit: Payer: Self-pay | Admitting: Orthopedic Surgery

## 2014-10-27 ENCOUNTER — Other Ambulatory Visit: Payer: Self-pay | Admitting: Internal Medicine

## 2014-10-27 DIAGNOSIS — T17908A Unspecified foreign body in respiratory tract, part unspecified causing other injury, initial encounter: Secondary | ICD-10-CM

## 2014-11-02 ENCOUNTER — Ambulatory Visit
Admission: RE | Admit: 2014-11-02 | Discharge: 2014-11-02 | Disposition: A | Discharge status: Home / Self Care | Payer: 59 | Source: Ambulatory Visit | Attending: Internal Medicine | Admitting: Internal Medicine

## 2014-11-02 DIAGNOSIS — I209 Angina pectoris, unspecified: Secondary | ICD-10-CM | POA: Diagnosis not present

## 2014-11-02 DIAGNOSIS — M47896 Other spondylosis, lumbar region: Secondary | ICD-10-CM | POA: Diagnosis not present

## 2014-11-02 DIAGNOSIS — R972 Elevated prostate specific antigen [PSA]: Secondary | ICD-10-CM | POA: Insufficient documentation

## 2014-11-02 DIAGNOSIS — F419 Anxiety disorder, unspecified: Secondary | ICD-10-CM | POA: Diagnosis not present

## 2014-11-02 DIAGNOSIS — K649 Unspecified hemorrhoids: Secondary | ICD-10-CM | POA: Insufficient documentation

## 2014-11-02 DIAGNOSIS — E785 Hyperlipidemia, unspecified: Secondary | ICD-10-CM | POA: Diagnosis not present

## 2014-11-02 DIAGNOSIS — K859 Acute pancreatitis, unspecified: Secondary | ICD-10-CM | POA: Diagnosis not present

## 2014-11-02 DIAGNOSIS — T17920A Food in respiratory tract, part unspecified causing asphyxiation, initial encounter: Secondary | ICD-10-CM | POA: Insufficient documentation

## 2014-11-02 DIAGNOSIS — T17908A Unspecified foreign body in respiratory tract, part unspecified causing other injury, initial encounter: Secondary | ICD-10-CM

## 2014-11-02 DIAGNOSIS — J310 Chronic rhinitis: Secondary | ICD-10-CM | POA: Diagnosis not present

## 2014-11-02 DIAGNOSIS — K219 Gastro-esophageal reflux disease without esophagitis: Secondary | ICD-10-CM | POA: Insufficient documentation

## 2014-11-02 DIAGNOSIS — Z720 Tobacco use: Secondary | ICD-10-CM | POA: Insufficient documentation

## 2014-11-02 DIAGNOSIS — Q7959 Other congenital malformations of abdominal wall: Secondary | ICD-10-CM | POA: Insufficient documentation

## 2014-11-02 DIAGNOSIS — K469 Unspecified abdominal hernia without obstruction or gangrene: Secondary | ICD-10-CM | POA: Diagnosis not present

## 2014-11-02 DIAGNOSIS — R1313 Dysphagia, pharyngeal phase: Secondary | ICD-10-CM | POA: Diagnosis not present

## 2014-11-02 DIAGNOSIS — N529 Male erectile dysfunction, unspecified: Secondary | ICD-10-CM | POA: Insufficient documentation

## 2014-11-02 DIAGNOSIS — M5136 Other intervertebral disc degeneration, lumbar region: Secondary | ICD-10-CM | POA: Diagnosis not present

## 2014-11-02 DIAGNOSIS — K224 Dyskinesia of esophagus: Secondary | ICD-10-CM | POA: Insufficient documentation

## 2014-11-02 DIAGNOSIS — N401 Enlarged prostate with lower urinary tract symptoms: Secondary | ICD-10-CM | POA: Diagnosis not present

## 2014-11-02 DIAGNOSIS — I10 Essential (primary) hypertension: Secondary | ICD-10-CM | POA: Insufficient documentation

## 2014-11-02 NOTE — Therapy (Signed)
[place], [place], [address] Phone: [phone]   Fax:     Modified Barium Swallow  Patient Details  Name: Joseph Larson MRN: [id number] Date of Birth: 02-04-1956 Referring Provider:  Idelle Crouch, MD  Encounter Date: 11/02/2014      End of Session - 11/02/14 1617    Visit Number 1   Number of Visits 1   Date for SLP Re-Evaluation 11/02/14   SLP Start Time 1245   SLP Stop Time  1330   SLP Time Calculation (min) 45 min   Activity Tolerance Patient tolerated treatment well      Past Medical History  Diagnosis Date  . Pancreatitis   . GERD (gastroesophageal reflux disease)   . Anxiety   . Rhinitis     chronic  . ED (erectile dysfunction)   . Arthritis     osteoarthritis  . Neuralgia   . Angina pectoris   . Rectus diastasis   . Hypertension   . Hyperlipidemia   . Esophageal spasm   . Elevated PSA   . BPH (benign prostatic hyperplasia)   . Erectile dysfunction   . Coronary atherosclerosis     Past Surgical History  Procedure Laterality Date  . Eus  01/17/2012    Procedure: UPPER ENDOSCOPIC ULTRASOUND (EUS) LINEAR;  Surgeon: Milus Banister, MD;  Location: [facility];  Service: Endoscopy;  Laterality: N/A;  radial linear   . Cardiac catheterization  2010  . Hernia repair      Right inguinal  . Excision morton's neuroma      Left Foot   Subjective: Patient behavior: Patient is alert, able to follow directions, able to relay his medical history Chief complaint: choking on ice water, sensation foods sticking   Objective:  Radiological Procedure: A videoflouroscopic evaluation of oral-preparatory, reflex initiation, and pharyngeal phases of the swallow was performed; as well as a screening of the upper esophageal phase.  I. POSTURE: Upright in MBS chair  II. VIEW: Lateral  III. COMPENSATORY STRATEGIES: Small sips (prevents aspiration), alternating liquids and solids  (clears pharyngeal residue)  IV. BOLUSES ADMINISTERED:  Thin Liquid: 2 small sips, 4 large, rapid sips   Nectar-thick Liquid: 1 sip    Puree: 2 teaspoon boluses   Mechanical Soft: 1/4 graham cracker in apple sauce  V. RESULTS OF EVALUATION: A. ORAL PREPARATORY PHASE: (The lips, tongue, and velum are observed for strength and coordination)  Within normal limits       **Overall Severity Rating: SNL  B. SWALLOW INITIATION/REFLEX: (The reflex is normal if "triggered" by the time the bolus reached the base of the tongue) Within normal limits  **Overall Severity Rating: WNL  C. PHARYNGEAL PHASE: (Pharyngeal function is normal if the bolus shows rapid, smooth, and continuous transit through the pharynx and there is no pharyngeal residue after the swallow) Decreased pharyngeal pressure generation (decreased tongue base retraction), decreased hyolaryngeal movement, incomplete epiglottic inversion, decreased duration and amplitude of UES opening, and mild pharyngeal residue (solids greater than liquids).  The patient has prominent cervical vertebrae occupying space in the pharynx.    **Overall Severity Rating: Mild  D. LARYNGEAL PENETRATION: (Material entering into the laryngeal inlet/vestibule but not aspirated) TRACE X1 during thin liquid rapid drinking  E. ASPIRATION:TRACE X1 of penetrated material  F. ESOPHAGEAL PHASE: (Screening of the upper esophagus) N/A, recent barium swallow study  ASSESSMENT: This 59 year old man; with recent barium swallow study positive for aspiration, past  medical history positive for esophageal spasm and GERD; is presenting with mild pharyngeal dysphagia characterized decreased pharyngeal pressure generation (decreased tongue base retraction), decreased hyolaryngeal movement, incomplete epiglottic inversion, decreased duration and amplitude of UES opening, and mild pharyngeal residue.   There was one observed laryngeal penetration / aspiration (trace) with gulping thin  liquid.  Alternating liquids and solids is effective in clearing pharyngeal residue.  Small sips of liquid is effective in preventing aspiration.  The patient does not appear to be at risk for significant amount of prandial aspiration.  The patient has prominent cervical vertebrae occupying space in the pharynx, which may explain the decrease in hyolaryngeal movement and incomplete epiglottic inversion.  The patient was given written and verbal teaching regarding the Shaker exercise (Shaker exercise consists of isometric and isotonic contraction movement, enhancing the strength of strap muscles and increasing the opening of UES).  He is advised to take small bites / sips and to alternate solids and liquids.  The patient complained of reduced range of motion in his neck and was given simple stretching exercises.  PLAN/RECOMMENDATIONS:  A. Diet: Usual diet as tolerated   B. Swallowing Precautions: Small bites/sips.  Alternate liquids/solids   C. Recommended consultation to ENT    D. Therapy recommendations: patient given swallowing exercise   E. Results and recommendations were discussed with the patient immediately following the study and final report routed to referring MD.  There were no vitals filed for this visit.  Visit Diagnosis: Dysphagia, pharyngeal  Aspiration into airway, initial encounter - Plan: DG SWALLOWING FUNC-SPEECH PATHOLOGY, DG SWALLOWING FUNC-SPEECH PATHOLOGY    Problem List Patient Active Problem List   Diagnosis Date Noted  . 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 Elevated PSA 09/06/2014  . Other male erectile dysfunction 09/06/2014  . Acute inflammation of the pancreas 08/18/2014  . Alcohol drinker 08/18/2014  . Angina pectoris 08/18/2014  . Anxiety 08/18/2014  . Cervical pain 08/18/2014  . Chronic rhinitis 08/18/2014  . Diastasis recti 08/18/2014  . 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  . Barsony-Polgar syndrome  08/18/2014  . Acid reflux 08/18/2014  . Headache, migraine 08/18/2014  . Abdominal hernia 08/18/2014  . Hemorrhoid 08/18/2014  . HLD (hyperlipidemia) 08/18/2014  . BP (high blood pressure) 08/18/2014  . LBP (low back pain) 08/18/2014  . Nerve pain 08/18/2014  . Arthritis, degenerative 08/18/2014  . Compulsive tobacco user syndrome 08/18/2014  . Lumbar canal stenosis 07/07/2014  . Neuritis or radiculitis due to rupture of lumbar intervertebral disc 05/14/2014  . DDD (degenerative disc disease), lumbar 05/03/2014  . Low back strain 05/03/2014  . Abnormal prostate specific antigen 01/25/2014  . Pancreatitis 01/17/2012   Leroy Sea, MS/CCC- SLP  Lou Miner 11/02/2014, 4:20 PM  [facility] [place], [place], [address] Phone: [phone]   Fax:

## 2015-03-01 ENCOUNTER — Other Ambulatory Visit: Payer: 59

## 2015-03-01 DIAGNOSIS — R972 Elevated prostate specific antigen [PSA]: Secondary | ICD-10-CM

## 2015-03-02 LAB — PSA: Prostate Specific Ag, Serum: 4.5 ng/mL — ABNORMAL HIGH (ref 0.0–4.0)

## 2015-03-08 ENCOUNTER — Ambulatory Visit
Admission: RE | Admit: 2015-03-08 | Discharge: 2015-03-08 | Disposition: A | Discharge status: Home / Self Care | Payer: 59 | Source: Ambulatory Visit | Attending: Urology | Admitting: Urology

## 2015-03-08 ENCOUNTER — Ambulatory Visit (INDEPENDENT_AMBULATORY_CARE_PROVIDER_SITE_OTHER): Payer: 59 | Admitting: Urology

## 2015-03-08 ENCOUNTER — Encounter: Payer: Self-pay | Admitting: Urology

## 2015-03-08 VITALS — BP 138/86 | HR 80 | Ht 72.0 in | Wt 202.4 lb

## 2015-03-08 DIAGNOSIS — R109 Unspecified abdominal pain: Secondary | ICD-10-CM

## 2015-03-08 DIAGNOSIS — R10A1 Flank pain, right side: Secondary | ICD-10-CM | POA: Insufficient documentation

## 2015-03-08 DIAGNOSIS — R8271 Bacteriuria: Secondary | ICD-10-CM

## 2015-03-08 DIAGNOSIS — N401 Enlarged prostate with lower urinary tract symptoms: Secondary | ICD-10-CM | POA: Diagnosis not present

## 2015-03-08 DIAGNOSIS — N528 Other male erectile dysfunction: Secondary | ICD-10-CM

## 2015-03-08 DIAGNOSIS — R972 Elevated prostate specific antigen [PSA]: Secondary | ICD-10-CM

## 2015-03-08 DIAGNOSIS — N138 Other obstructive and reflux uropathy: Secondary | ICD-10-CM

## 2015-03-08 LAB — URINALYSIS, COMPLETE
Bilirubin, UA: NEGATIVE
GLUCOSE, UA: NEGATIVE
Leukocytes, UA: NEGATIVE
Nitrite, UA: NEGATIVE
PH UA: 6 (ref 5.0–7.5)
RBC, UA: NEGATIVE
Specific Gravity, UA: >1.03 — ABNORMAL HIGH (ref 1.005–1.030)
Urobilinogen, Ur: 0.2 mg/dL (ref 0.2–1.0)

## 2015-03-08 LAB — MICROSCOPIC EXAMINATION
Epithelial Cells (non renal): NONE SEEN /hpf (ref 0–10)
WBC, UA: NONE SEEN /hpf (ref 0–?)

## 2015-03-08 NOTE — Progress Notes (Signed)
10:25 AM   Joseph Larson 07/21/1955 [id number]  Referring provider: Idelle Crouch, MD [facility] [place], [place] [address]  Chief Complaint  Patient presents with  . Benign Prostatic Hypertrophy    6 month followup  . Erectile Dysfunction    HPI: Patient is a 59 year old white male who has an elevated PSA, erectile dysfunction and BPH with LUTS who presents today for a 6 month follow up.  He has also been experiencing right flank pain for the last month.  Right flank pain Patient states he had the sudden onset of right flank pain one month ago.  He has a history of nephrolithiasis.  His stone composition is unknown at this time.  He has passed stones spontaneously in the past.  He describes the pain as a soreness.  It does not radiate.  Any type of movement seems to make it worse.  He has not had hematuria.  He did have an episode of difficulty passing his urine three days ago that was associated with cloudy, dark yellow urine.  He states the pain abated that day and has not returned.    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  We have been following his PSA's every 6 months.  His most recent PSA being 4.5 ng/mL on 03/01/2015.    Erectile dysfunction Patient's SHIM score is 17 (mild).  He is not currently very sexually active due to his wife's medical condition.  He states they have sex "once in a blue moon."  When he does have intercourse, it is satisfactory.       SHIM      03/08/15 0854       SHIM: Over the last 6 months:   How do you rate your confidence that you could get and keep an erection? Low     When you had erections with sexual stimulation, how often were your erections hard enough for penetration (entering your partner)? Sometimes (about half the time)     During sexual intercourse, how often were you able to maintain your  erection after you had penetrated (entered) your partner? Slightly Difficult     During sexual intercourse, how difficult was it to maintain your erection to completion of intercourse? Slightly Difficult     When you attempted sexual intercourse, how often was it satisfactory for you? Slightly Difficult     SHIM Total Score   SHIM 17       BPH WITH LUTS His IPSS score today is 15, which is moderate lower urinary tract symptomatology. He is mixed with his quality life due to his urinary symptoms.  His major complaint today urgency.  He has had these symptoms for the last year.  He denies any dysuria, hematuria or suprapubic pain.   He also denies any recent fevers, chills, nausea or vomiting.   He has a family history of PCa, with his brother having prostate cancer.         IPSS      03/08/15 0800       International Prostate Symptom Score   How often have you had the sensation of not emptying your bladder? Less than 1 in 5     How often have you had to urinate less than every two hours? Less than half the time  How often have you found you stopped and started again several times when you urinated? Less than 1 in 5 times     How often have you found it difficult to postpone urination? More than half the time     How often have you had a weak urinary stream? About half the time     How often have you had to strain to start urination? Not at All     How many times did you typically get up at night to urinate? 4 Times     Total IPSS Score 15     Quality of Life due to urinary symptoms   If you were to spend the rest of your life with your urinary condition just the way it is now how would you feel about that? Mixed        Score:  1-7 Mild 8-19 Moderate 20-35 Severe   PMH: Past Medical History  Diagnosis Date  . Pancreatitis   . GERD (gastroesophageal reflux disease)   . Anxiety   . Rhinitis     chronic  . ED (erectile dysfunction)   . Arthritis     osteoarthritis  .  Neuralgia   . Angina pectoris ([place])   . Rectus diastasis   . Hypertension   . Hyperlipidemia   . Esophageal spasm   . Elevated PSA   . BPH (benign prostatic hyperplasia)   . Erectile dysfunction   . Coronary atherosclerosis     Surgical History: Past Surgical History  Procedure Laterality Date  . Eus  01/17/2012    Procedure: UPPER ENDOSCOPIC ULTRASOUND (EUS) LINEAR;  Surgeon: Milus Banister, MD;  Location: [facility];  Service: Endoscopy;  Laterality: N/A;  radial linear   . Cardiac catheterization  2010  . Hernia repair      Right inguinal  . Excision morton's neuroma      Left Foot    Home Medications:    Medication List       This list is accurate as of: 03/08/15 10:25 AM.  Always use your most recent med list.               cyclobenzaprine 10 MG tablet  Commonly known as:  FLEXERIL     finasteride 5 MG tablet  Commonly known as:  PROSCAR  Take 5 mg by mouth daily.     HYDROcodone-acetaminophen 5-325 MG tablet  Commonly known as:  NORCO/VICODIN  Take by mouth.     metaxalone 800 MG tablet  Commonly known as:  SKELAXIN     nabumetone 500 MG tablet  Commonly known as:  RELAFEN  Take 500 mg by mouth daily.     pantoprazole 40 MG tablet  Commonly known as:  PROTONIX  Take 40 mg by mouth 2 (two) times daily.     sildenafil 100 MG tablet  Commonly known as:  VIAGRA  Take by mouth.     tamsulosin 0.4 MG Caps capsule  Commonly known as:  FLOMAX  Take 0.4 mg by mouth.        Allergies: No Known Allergies  Family History: Family History  Problem Relation Age of Onset  . Prostate cancer Brother   . Kidney disease Neg Hx     Social History:  reports that he has been smoking Cigarettes.  He has a 20 pack-year smoking history. He does not have any smokeless tobacco history on file. He reports that he does not drink alcohol or use illicit drugs.  ROS: Urological Symptom Review  Patient is experiencing the following symptoms: Hard to postpone  urination Get up at night to urinate Stream starts and stops Weak stream   Review of Systems  Gastrointestinal (upper)  : Negative for upper GI symptoms  Gastrointestinal (lower) : Negative for lower GI symptoms  Constitutional : Fatigue  Skin: Negative for skin symptoms  Eyes: Negative for eye symptoms  Ear/Nose/Throat : Negative for Ear/Nose/Throat symptoms  Hematologic/Lymphatic: Negative for Hematologic/Lymphatic symptoms  Cardiovascular : Negative for cardiovascular symptoms  Respiratory : Negative for respiratory symptoms  Endocrine: Negative for endocrine symptoms  Musculoskeletal: Back pain Joint pain  Neurological: Negative for neurological symptoms  Psychologic: Negative for psychiatric symptoms   Physical Exam: BP 138/86 mmHg  Pulse 80  Ht 6' (1.829 m)  Wt 202 lb 6.4 oz (91.808 kg)  BMI 27.44 kg/m2  GU: Patient with a circumcised phallus.  Urethral meatus is patent.  No penile discharge. No penile lesions or rashes. Scrotum without lesions, cysts, rashes and/or edema.  Testicles are located scrotally bilaterally. No masses are appreciated in the testicles. Left and right epididymis are normal. Rectal: Patient with  normal sphincter tone. Perineum without scarring or rashes. No rectal masses are appreciated. Prostate is approximately 50 grams, no nodules are appreciated. Seminal vesicles are normal.   Laboratory Data: Lab Results  Component Value Date   WBC 11.8* 09/07/2012   HGB 14.4 09/07/2012   HCT 41.2 09/07/2012   MCV 87 09/07/2012   PLT 181 09/07/2012    Lab Results  Component Value Date   CREATININE 1.65* 09/07/2012    Lab Results  Component Value Date   PSA 4.5* 03/01/2015   PSA 4.5* 09/06/2014    Urinalysis Results for orders placed or performed in visit on 03/08/15  Microscopic Examination  Result Value Ref Range   WBC, UA None seen 0 -  5 /hpf   RBC, UA 0-2 0 -  2 /hpf   Epithelial Cells (non renal) None seen 0  - 10 /hpf   Mucus, UA Present (A) Not Estab.   Bacteria, UA Many (A) None seen/Few   Yeast, UA Present (A) None seen  Urinalysis, Complete  Result Value Ref Range   Specific Gravity, UA >1.030 (H) 1.005 - 1.030   pH, UA 6.0 5.0 - 7.5   Color, UA Yellow Yellow   Appearance Ur Clear Clear   Leukocytes, UA Negative Negative   Protein, UA 1+ (A) Negative/Trace   Glucose, UA Negative Negative   Ketones, UA Trace (A) Negative   RBC, UA Negative Negative   Bilirubin, UA Negative Negative   Urobilinogen, Ur 0.2 0.2 - 1.0 mg/dL   Nitrite, UA Negative Negative   Microscopic Examination See below:      Pertinent Imaging: Results for TIMOTY, BOURKE (MRN [id number]) as of 09/06/2014 10:52  Ref. Range 09/06/2014 10:21  Scan Result Unknown 38    Assessment & Plan:   1. Elevated PSA:   Patient underwent a biopsy on 08/28/2013 for a PSA of 5.26 ng/mL and was found to have HGPIN in 2 cores.  Patient's most recent PSA was 4.5 ng/mL on 03/01/2015.  Patient will RTC in 6 months for a PSA and exam.    2. BPH with obstruction/lower urinary tract symptoms:   Patient's IPSS score is 15 (moderate).  His DRE demonstrates enlargement.  He is mixed about his urinary symptoms, but he did not find any improvement with tamsulosin and finasteride.  He does not want to  restart these medications at this time and he does not desire any other treatments for his LUTS.  He will follow up in 6 months for a PSA, exam and an IPSS score.    3. Erectile dysfunction- Patient's SHIM score is 17 (mild ED).  He is having sex infrequently.  But when he does, he is finding it satisfactory. He is also taking Viagra prescribed by his primary care physician.    4. Right flank pain:   Patient's UA contains many bacteria and I will send it for culture.  His KUB today did not demonstrate any stones.    Return in about 6 months (around 09/06/2015) for IPSS score and exam.  Zara Council, [facility] [address], [place], [place] [address] [phone]

## 2015-03-09 ENCOUNTER — Telehealth: Payer: Self-pay

## 2015-03-09 NOTE — Telephone Encounter (Signed)
-----   Message from Nori Riis, PA-C sent at 03/08/2015 10:24 AM EST ----- Patient's x-ray did not demonstrate any stones.  There was no blood in the urine, but I will send it for culture.  We should have those results in 3 to five days and we will contact him with those results.

## 2015-03-09 NOTE — Telephone Encounter (Signed)
Spoke with pt in reference to x-ray and ucx. Pt voiced understanding.

## 2015-03-10 LAB — CULTURE, URINE COMPREHENSIVE

## 2015-03-11 ENCOUNTER — Telehealth: Payer: Self-pay

## 2015-03-11 DIAGNOSIS — N390 Urinary tract infection, site not specified: Secondary | ICD-10-CM

## 2015-03-11 MED ORDER — AMOXICILLIN 875 MG PO TABS: 875.0000 mg | ORAL_TABLET | Freq: Two times a day (BID) | ORAL | Status: AC

## 2015-03-11 NOTE — Telephone Encounter (Signed)
Spoke with pt in reference to +ucx. Pt voiced understanding. Pt will RTC between 12/27-12/29 for urine sample. Pt will call prior to coming.

## 2015-03-11 NOTE — Telephone Encounter (Signed)
-----   Message from Nori Riis, PA-C sent at 03/10/2015  4:43 PM EST ----- Patient has a +UCx.  They need to start amoxicillin '875mg'$  one tablet twice daily for seven days and then we need to check a specimen in 3 to 5 days after they complete their antibiotics.

## 2015-03-17 ENCOUNTER — Other Ambulatory Visit: Payer: 59

## 2015-03-17 ENCOUNTER — Other Ambulatory Visit: Payer: Self-pay | Admitting: *Deleted

## 2015-03-17 DIAGNOSIS — N390 Urinary tract infection, site not specified: Secondary | ICD-10-CM

## 2015-03-17 LAB — URINALYSIS, COMPLETE
Bilirubin, UA: NEGATIVE
Glucose, UA: NEGATIVE
Ketones, UA: NEGATIVE
LEUKOCYTES UA: NEGATIVE
Nitrite, UA: NEGATIVE
PH UA: 6 (ref 5.0–7.5)
PROTEIN UA: NEGATIVE
Specific Gravity, UA: 1.02 (ref 1.005–1.030)
Urobilinogen, Ur: 0.2 mg/dL (ref 0.2–1.0)

## 2015-03-17 LAB — MICROSCOPIC EXAMINATION
BACTERIA UA: NONE SEEN
EPITHELIAL CELLS (NON RENAL): NONE SEEN /HPF (ref 0–10)
WBC, UA: NONE SEEN /hpf (ref 0–?)

## 2015-03-17 NOTE — Addendum Note (Signed)
Addended by: Orlene Erm on: 03/17/2015 08:55 AM   Modules accepted: Orders

## 2015-03-19 LAB — CULTURE, URINE COMPREHENSIVE

## 2015-03-22 ENCOUNTER — Telehealth: Payer: Self-pay

## 2015-03-22 NOTE — Telephone Encounter (Signed)
No answer

## 2015-03-22 NOTE — Telephone Encounter (Signed)
-----   Message from Nori Riis, PA-C sent at 03/19/2015 10:03 PM EST ----- Please let patient know his culture is negative.

## 2015-03-23 NOTE — Telephone Encounter (Signed)
Pt has been made aware of negative urine cx.

## 2015-03-23 NOTE — Telephone Encounter (Signed)
No answer

## 2015-06-09 ENCOUNTER — Ambulatory Visit: Payer: 59 | Attending: Specialist

## 2015-06-09 DIAGNOSIS — G4730 Sleep apnea, unspecified: Secondary | ICD-10-CM | POA: Diagnosis present

## 2015-06-09 DIAGNOSIS — G4761 Periodic limb movement disorder: Secondary | ICD-10-CM | POA: Insufficient documentation

## 2015-06-09 DIAGNOSIS — G4731 Primary central sleep apnea: Secondary | ICD-10-CM | POA: Diagnosis not present

## 2015-06-27 ENCOUNTER — Other Ambulatory Visit: Payer: Self-pay | Admitting: Nurse Practitioner

## 2015-06-27 ENCOUNTER — Ambulatory Visit
Admission: RE | Admit: 2015-06-27 | Discharge: 2015-06-27 | Disposition: A | Discharge status: Home / Self Care | Payer: 59 | Source: Ambulatory Visit | Attending: Nurse Practitioner | Admitting: Nurse Practitioner

## 2015-06-27 DIAGNOSIS — R1031 Right lower quadrant pain: Secondary | ICD-10-CM | POA: Diagnosis present

## 2015-06-27 DIAGNOSIS — D72829 Elevated white blood cell count, unspecified: Secondary | ICD-10-CM | POA: Insufficient documentation

## 2015-06-27 DIAGNOSIS — R1011 Right upper quadrant pain: Secondary | ICD-10-CM | POA: Insufficient documentation

## 2015-06-27 DIAGNOSIS — R109 Unspecified abdominal pain: Secondary | ICD-10-CM

## 2015-06-27 DIAGNOSIS — K625 Hemorrhage of anus and rectum: Secondary | ICD-10-CM | POA: Insufficient documentation

## 2015-06-27 DIAGNOSIS — G8929 Other chronic pain: Secondary | ICD-10-CM | POA: Insufficient documentation

## 2015-06-27 MED ORDER — IOPAMIDOL (ISOVUE-300) INJECTION 61%
100.0000 mL | Freq: Once | INTRAVENOUS | Status: AC | PRN
  Administered 2015-06-27: 100 mL via INTRAVENOUS

## 2015-06-30 ENCOUNTER — Encounter: Payer: Self-pay | Admitting: *Deleted

## 2015-07-01 ENCOUNTER — Ambulatory Visit
Admission: RE | Admit: 2015-07-01 | Discharge: 2015-07-01 | Disposition: A | Discharge status: Home / Self Care | Payer: 59 | Source: Ambulatory Visit | Attending: Unknown Physician Specialty | Admitting: Unknown Physician Specialty

## 2015-07-01 ENCOUNTER — Encounter: Payer: Self-pay | Admitting: Anesthesiology

## 2015-07-01 ENCOUNTER — Encounter
Admission: RE | Disposition: A | Discharge status: Home / Self Care | Payer: Self-pay | Source: Ambulatory Visit | Attending: Unknown Physician Specialty

## 2015-07-01 ENCOUNTER — Ambulatory Visit: Payer: 59 | Admitting: Anesthesiology

## 2015-07-01 DIAGNOSIS — Z9889 Other specified postprocedural states: Secondary | ICD-10-CM | POA: Diagnosis not present

## 2015-07-01 DIAGNOSIS — Z79891 Long term (current) use of opiate analgesic: Secondary | ICD-10-CM | POA: Insufficient documentation

## 2015-07-01 DIAGNOSIS — Z79899 Other long term (current) drug therapy: Secondary | ICD-10-CM | POA: Insufficient documentation

## 2015-07-01 DIAGNOSIS — I2510 Atherosclerotic heart disease of native coronary artery without angina pectoris: Secondary | ICD-10-CM | POA: Diagnosis not present

## 2015-07-01 DIAGNOSIS — K219 Gastro-esophageal reflux disease without esophagitis: Secondary | ICD-10-CM | POA: Diagnosis not present

## 2015-07-01 DIAGNOSIS — E785 Hyperlipidemia, unspecified: Secondary | ICD-10-CM | POA: Insufficient documentation

## 2015-07-01 DIAGNOSIS — R1011 Right upper quadrant pain: Secondary | ICD-10-CM | POA: Insufficient documentation

## 2015-07-01 DIAGNOSIS — Z800 Family history of malignant neoplasm of digestive organs: Secondary | ICD-10-CM | POA: Diagnosis not present

## 2015-07-01 DIAGNOSIS — M792 Neuralgia and neuritis, unspecified: Secondary | ICD-10-CM | POA: Insufficient documentation

## 2015-07-01 DIAGNOSIS — R1310 Dysphagia, unspecified: Secondary | ICD-10-CM | POA: Insufficient documentation

## 2015-07-01 DIAGNOSIS — K641 Second degree hemorrhoids: Secondary | ICD-10-CM | POA: Diagnosis not present

## 2015-07-01 DIAGNOSIS — I10 Essential (primary) hypertension: Secondary | ICD-10-CM | POA: Insufficient documentation

## 2015-07-01 DIAGNOSIS — M5136 Other intervertebral disc degeneration, lumbar region: Secondary | ICD-10-CM | POA: Insufficient documentation

## 2015-07-01 DIAGNOSIS — M1990 Unspecified osteoarthritis, unspecified site: Secondary | ICD-10-CM | POA: Diagnosis not present

## 2015-07-01 DIAGNOSIS — F419 Anxiety disorder, unspecified: Secondary | ICD-10-CM | POA: Diagnosis not present

## 2015-07-01 DIAGNOSIS — R195 Other fecal abnormalities: Secondary | ICD-10-CM | POA: Diagnosis present

## 2015-07-01 DIAGNOSIS — F17210 Nicotine dependence, cigarettes, uncomplicated: Secondary | ICD-10-CM | POA: Diagnosis not present

## 2015-07-01 DIAGNOSIS — D123 Benign neoplasm of transverse colon: Secondary | ICD-10-CM | POA: Insufficient documentation

## 2015-07-01 DIAGNOSIS — Z8042 Family history of malignant neoplasm of prostate: Secondary | ICD-10-CM | POA: Diagnosis not present

## 2015-07-01 DIAGNOSIS — K5730 Diverticulosis of large intestine without perforation or abscess without bleeding: Secondary | ICD-10-CM | POA: Insufficient documentation

## 2015-07-01 DIAGNOSIS — Z8719 Personal history of other diseases of the digestive system: Secondary | ICD-10-CM | POA: Insufficient documentation

## 2015-07-01 DIAGNOSIS — R1031 Right lower quadrant pain: Secondary | ICD-10-CM | POA: Diagnosis present

## 2015-07-01 DIAGNOSIS — Z833 Family history of diabetes mellitus: Secondary | ICD-10-CM | POA: Diagnosis not present

## 2015-07-01 DIAGNOSIS — N400 Enlarged prostate without lower urinary tract symptoms: Secondary | ICD-10-CM | POA: Insufficient documentation

## 2015-07-01 DIAGNOSIS — J310 Chronic rhinitis: Secondary | ICD-10-CM | POA: Diagnosis not present

## 2015-07-01 DIAGNOSIS — N529 Male erectile dysfunction, unspecified: Secondary | ICD-10-CM | POA: Diagnosis not present

## 2015-07-01 HISTORY — PX: COLONOSCOPY WITH PROPOFOL: SHX5780

## 2015-07-01 HISTORY — PX: ESOPHAGOGASTRODUODENOSCOPY (EGD) WITH PROPOFOL: SHX5813

## 2015-07-01 SURGERY — COLONOSCOPY WITH PROPOFOL
Anesthesia: General

## 2015-07-01 MED ORDER — PROPOFOL 10 MG/ML IV BOLUS
INTRAVENOUS | Status: DC | PRN
  Administered 2015-07-01: 90 mg via INTRAVENOUS

## 2015-07-01 MED ORDER — BUTAMBEN-TETRACAINE-BENZOCAINE 2-2-14 % EX AERO
INHALATION_SPRAY | CUTANEOUS | Status: DC | PRN
  Administered 2015-07-01: 1 via TOPICAL

## 2015-07-01 MED ORDER — IPRATROPIUM-ALBUTEROL 0.5-2.5 (3) MG/3ML IN SOLN
RESPIRATORY_TRACT | Status: AC
  Administered 2015-07-01: 3 mL via RESPIRATORY_TRACT
  Filled 2015-07-01: qty 3

## 2015-07-01 MED ORDER — FENTANYL CITRATE (PF) 100 MCG/2ML IJ SOLN
INTRAMUSCULAR | Status: DC | PRN
  Administered 2015-07-01: 50 ug via INTRAVENOUS

## 2015-07-01 MED ORDER — IPRATROPIUM-ALBUTEROL 0.5-2.5 (3) MG/3ML IN SOLN
3.0000 mL | Freq: Once | RESPIRATORY_TRACT | Status: AC
  Administered 2015-07-01: 3 mL via RESPIRATORY_TRACT

## 2015-07-01 MED ORDER — PROPOFOL 500 MG/50ML IV EMUL
INTRAVENOUS | Status: DC | PRN
Start: 2015-07-01 — End: 2015-07-01
  Administered 2015-07-01: 100 ug/kg/min via INTRAVENOUS

## 2015-07-01 MED ORDER — SODIUM CHLORIDE 0.9 % IV SOLN
INTRAVENOUS | Status: DC
  Administered 2015-07-01: 1000 mL via INTRAVENOUS

## 2015-07-01 NOTE — Op Note (Signed)
[facility] Patient Name: Joseph Larson Procedure Date: 07/01/2015 4:22 PM MRN: [id number] Account #: [id number] Date of Birth: Nov 08, 1955 Admit Type: Outpatient Age: 60 Room: [facility] ENDO ROOM 1 Gender: Male Note Status: Finalized Procedure:            Upper GI endoscopy Indications:          Dysphagia, Heartburn Providers:            Manya Silvas, MD Referring MD:         Leonie Douglas. Doy Hutching, MD (Referring MD) Medicines:            Propofol per Anesthesia Complications:        No immediate complications. Procedure:            Pre-Anesthesia Assessment:                       - After reviewing the risks and benefits, the patient                        was deemed in satisfactory condition to undergo the                        procedure.                       After obtaining informed consent, the endoscope was                        passed under direct vision. Throughout the procedure,                        the patient's blood pressure, pulse, and oxygen                        saturations were monitored continuously. The Endoscope                        was introduced through the mouth, and advanced to the                        second part of duodenum. The upper GI endoscopy was                        accomplished without difficulty. The patient tolerated                        the procedure well. Findings:      The examined esophagus was normal. At the end of the procedure A       guidewire was placed and the scope was withdrawn. Dilation was performed       with a Savary dilator with no resistance at 17 mm.      The stomach was normal.      The examined duodenum was normal. Impression:           - Normal esophagus. Dilated.                       - Normal stomach.                       - Normal examined duodenum.                       -  No specimens collected. Recommendation:       - soft food for 3 days, eat slowly, chew well, take              small bites, proceed with colonoscopy Manya Silvas, MD 07/01/2015 4:36:13 PM This report has been signed electronically. Number of Addenda: 0 Note Initiated On: 07/01/2015 4:22 PM      [facility]

## 2015-07-01 NOTE — Op Note (Signed)
[facility]enterology Patient Name: Joseph Larson Procedure Date: 07/01/2015 4:21 PM MRN: [id number] Account #: [id number] Date of Birth: 23-May-1955 Admit Type: Outpatient Age: 60 Room: [facility] ENDO ROOM 1 Gender: Male Note Status: Finalized Procedure:            Colonoscopy Indications:          Heme positive stool Providers:            Manya Silvas, MD Referring MD:         Leonie Douglas. Doy Hutching, MD (Referring MD) Medicines:            Propofol per Anesthesia Complications:        No immediate complications. Procedure:            Pre-Anesthesia Assessment:                       - After reviewing the risks and benefits, the patient                        was deemed in satisfactory condition to undergo the                        procedure.                       After obtaining informed consent, the colonoscope was                        passed under direct vision. Throughout the procedure,                        the patient's blood pressure, pulse, and oxygen                        saturations were monitored continuously. The                        Colonoscope was introduced through the anus and                        advanced to the the cecum, identified by appendiceal                        orifice and ileocecal valve. The colonoscopy was                        performed without difficulty. The patient tolerated the                        procedure well. The quality of the bowel preparation                        was good. Findings:      Two sessile polyps were found in the transverse colon. The polyps were       diminutive in size. These polyps were removed with a jumbo cold forceps.       Resection and retrieval were complete.      A few small-mouthed diverticula were found in the sigmoid colon,       descending colon and transverse colon.      Internal hemorrhoids were found during endoscopy.  The hemorrhoids were       small and Grade I (internal  hemorrhoids that do not prolapse).      The exam was otherwise without abnormality. Impression:           - Two diminutive polyps in the transverse colon,                        removed with a jumbo cold forceps. Resected and                        retrieved.                       - Diverticulosis in the sigmoid colon, in the                        descending colon and in the transverse colon.                       - Internal hemorrhoids.                       - The examination was otherwise normal. Recommendation:       - Await pathology results. Manya Silvas, MD 07/01/2015 5:13:20 PM This report has been signed electronically. Number of Addenda: 0 Note Initiated On: 07/01/2015 4:21 PM Scope Withdrawal Time: 0 hours 16 minutes 29 seconds  Total Procedure Duration: 0 hours 22 minutes 45 seconds       [facility]

## 2015-07-01 NOTE — Anesthesia Preprocedure Evaluation (Signed)
Anesthesia Evaluation  Patient identified by MRN, date of birth, ID band Patient awake    Reviewed: Allergy & Precautions, H&P , NPO status , Patient's Chart, lab work & pertinent test results  History of Anesthesia Complications Negative for: history of anesthetic complications  Airway Mallampati: III  TM Distance: >3 FB Neck ROM: limited    Dental  (+) Poor Dentition, Chipped, Caps   Pulmonary neg shortness of breath, COPD, Current Smoker,    Pulmonary exam normal breath sounds clear to auscultation       Cardiovascular Exercise Tolerance: Good hypertension, (-) angina+ CAD  (-) DOE Normal cardiovascular exam(-) Valvular Problems/Murmurs Rhythm:regular Rate:Normal     Neuro/Psych  Headaches, PSYCHIATRIC DISORDERS Anxiety  Neuromuscular disease    GI/Hepatic Neg liver ROS, GERD  Controlled,  Endo/Other  negative endocrine ROS  Renal/GU negative Renal ROS  negative genitourinary   Musculoskeletal  (+) Arthritis ,   Abdominal   Peds  Hematology negative hematology ROS (+)   Anesthesia Other Findings Past Medical History:   Pancreatitis                                                 GERD (gastroesophageal reflux disease)                       Anxiety                                                      Rhinitis                                                       Comment:chronic   ED (erectile dysfunction)                                    Arthritis                                                      Comment:osteoarthritis   Neuralgia                                                    Angina pectoris (HCC)                                        Rectus diastasis  Hypertension                                                 Hyperlipidemia                                               Esophageal spasm                                             Elevated PSA                                                  BPH (benign prostatic hyperplasia)                           Erectile dysfunction                                         Coronary atherosclerosis                                    Past Surgical History:   EUS                                              01/17/2012     Comment:Procedure: UPPER ENDOSCOPIC ULTRASOUND (EUS)               LINEAR;  Surgeon: Milus Banister, MD;                Location: [facility];  Service: Endoscopy;                Laterality: N/A;  radial linear    CARDIAC CATHETERIZATION                          2010         HERNIA REPAIR                                                   Comment:Right inguinal   EXCISION MORTON'S NEUROMA                                       Comment:Left Foot  BMI    Body Mass Index   25.99 kg/m 2      Reproductive/Obstetrics negative OB ROS  Anesthesia Physical Anesthesia Plan  ASA: III  Anesthesia Plan: General   Post-op Pain Management:    Induction:   Airway Management Planned:   Additional Equipment:   Intra-op Plan:   Post-operative Plan:   Informed Consent: I have reviewed the patients History and Physical, chart, labs and discussed the procedure including the risks, benefits and alternatives for the proposed anesthesia with the patient or authorized representative who has indicated his/her understanding and acceptance.   Dental Advisory Given  Plan Discussed with: Anesthesiologist, CRNA and Surgeon  Anesthesia Plan Comments:         Anesthesia Quick Evaluation

## 2015-07-01 NOTE — H&P (Signed)
Primary Care Physician:  Idelle Crouch, MD Primary Gastroenterologist:  Dr. Vira Agar  Pre-Procedure History & Physical: HPI:  Joseph Larson is a 60 y.o. male is here for an endoscopy and colonoscopy.   Past Medical History  Diagnosis Date  . Pancreatitis   . GERD (gastroesophageal reflux disease)   . Anxiety   . Rhinitis     chronic  . ED (erectile dysfunction)   . Arthritis     osteoarthritis  . Neuralgia   . Angina pectoris ([place])   . Rectus diastasis   . Hypertension   . Hyperlipidemia   . Esophageal spasm   . Elevated PSA   . BPH (benign prostatic hyperplasia)   . Erectile dysfunction   . Coronary atherosclerosis     Past Surgical History  Procedure Laterality Date  . Eus  01/17/2012    Procedure: UPPER ENDOSCOPIC ULTRASOUND (EUS) LINEAR;  Surgeon: Milus Banister, MD;  Location: [facility];  Service: Endoscopy;  Laterality: N/A;  radial linear   . Cardiac catheterization  2010  . Hernia repair      Right inguinal  . Excision morton's neuroma      Left Foot    Prior to Admission medications   Medication Sig Start Date End Date Taking? Authorizing Provider  cyclobenzaprine (FLEXERIL) 10 MG tablet  07/27/14  Yes Historical Provider, MD  finasteride (PROSCAR) 5 MG tablet Take 5 mg by mouth daily.   Yes Historical Provider, MD  HYDROcodone-acetaminophen (NORCO/VICODIN) 5-325 MG tablet Take by mouth. 09/22/14  Yes Historical Provider, MD  metaxalone (SKELAXIN) 800 MG tablet  06/11/14  Yes Historical Provider, MD  nabumetone (RELAFEN) 500 MG tablet Take 500 mg by mouth daily.   Yes Historical Provider, MD  pantoprazole (PROTONIX) 40 MG tablet Take 40 mg by mouth 2 (two) times daily.   Yes Historical Provider, MD  sildenafil (VIAGRA) 100 MG tablet Take by mouth.   Yes Historical Provider, MD  tamsulosin (FLOMAX) 0.4 MG CAPS capsule Take 0.4 mg by mouth.   Yes Historical Provider, MD    Allergies as of 06/27/2015  . (No Known Allergies)    Family History   Problem Relation Age of Onset  . Prostate cancer Brother   . Kidney disease Neg Hx     Social History   Social History  . Marital Status: Married    Spouse Name: N/A  . Number of Children: N/A  . Years of Education: N/A   Occupational History  . Not on file.   Social History Main Topics  . Smoking status: Current Every Day Smoker -- 0.50 packs/day for 40 years    Types: Cigarettes  . Smokeless tobacco: Not on file  . Alcohol Use: No  . Drug Use: No  . Sexual Activity: Not on file   Other Topics Concern  . Not on file   Social History Narrative    Review of Systems: See HPI, otherwise negative ROS  Physical Exam: BP 132/68 mmHg  Pulse 57  Temp(Src) 96.9 F (36.1 C) (Tympanic)  Resp 16  Ht '6\' 1"'$  (1.854 m)  Wt 89.359 kg (197 lb)  BMI 26.00 kg/m2  SpO2 98% General:   Alert,  pleasant and cooperative in NAD Head:  Normocephalic and atraumatic. Neck:  Supple; no masses or thyromegaly. Lungs:  Clear throughout to auscultation.    Heart:  Regular rate and rhythm. Abdomen:  Soft, nontender and nondistended. Normal bowel sounds, without guarding, and without rebound.   Neurologic:  Alert and  oriented x4;  grossly normal neurologically.  Impression/Plan: Joseph Larson is here for an endoscopy and colonoscopy to be performed for Heme positive stool,abdominal pain, RLQ and RUQ, also dysphagia  Risks, benefits, limitations, and alternatives regarding  endoscopy and colonoscopy have been reviewed with the patient.  Questions have been answered.  All parties agreeable.   Gaylyn Cheers, MD  07/01/2015, 4:20 PM

## 2015-07-01 NOTE — Anesthesia Postprocedure Evaluation (Signed)
Anesthesia Post Note  Patient: Joseph Larson  Procedure(s) Performed: Procedure(s) (LRB): COLONOSCOPY WITH PROPOFOL (N/A) ESOPHAGOGASTRODUODENOSCOPY (EGD) WITH PROPOFOL (N/A)  Patient location during evaluation: Endoscopy Anesthesia Type: General Level of consciousness: awake and alert Pain management: pain level controlled Vital Signs Assessment: post-procedure vital signs reviewed and stable Respiratory status: spontaneous breathing, nonlabored ventilation, respiratory function stable and patient connected to nasal cannula oxygen Cardiovascular status: blood pressure returned to baseline and stable Postop Assessment: no signs of nausea or vomiting Anesthetic complications: no    Last Vitals:  Filed Vitals:   07/01/15 1725 07/01/15 1735  BP: 124/87 137/80  Pulse: 64 58  Temp:    Resp: 12 18    Last Pain:  Filed Vitals:   07/01/15 1737  PainSc: Asleep                 Precious Haws Piscitello

## 2015-07-01 NOTE — Transfer of Care (Signed)
Immediate Anesthesia Transfer of Care Note  Patient: Joseph Larson  Procedure(s) Performed: Procedure(s): COLONOSCOPY WITH PROPOFOL (N/A) ESOPHAGOGASTRODUODENOSCOPY (EGD) WITH PROPOFOL (N/A)  Patient Location: PACU and Endoscopy Unit  Anesthesia Type:General  Level of Consciousness: sedated and responds to stimulation  Airway & Oxygen Therapy: Patient Spontanous Breathing and Patient connected to nasal cannula oxygen  Post-op Assessment: Report given to RN and Post -op Vital signs reviewed and stable  Post vital signs: Reviewed and stable  Last Vitals:  Filed Vitals:   07/01/15 1521 07/01/15 1705  BP: 132/68 111/64  Pulse: 57 64  Temp: 36.1 C 35.5 C  Resp: 16 12    Complications: No apparent anesthesia complications

## 2015-07-05 ENCOUNTER — Encounter: Payer: Self-pay | Admitting: Unknown Physician Specialty

## 2015-07-05 LAB — SURGICAL PATHOLOGY

## 2015-09-02 ENCOUNTER — Other Ambulatory Visit: Payer: 59

## 2015-09-02 DIAGNOSIS — R972 Elevated prostate specific antigen [PSA]: Secondary | ICD-10-CM

## 2015-09-03 LAB — PSA: PROSTATE SPECIFIC AG, SERUM: 5.2 ng/mL — AB (ref 0.0–4.0)

## 2015-09-05 DIAGNOSIS — Z789 Other specified health status: Secondary | ICD-10-CM | POA: Insufficient documentation

## 2015-09-06 ENCOUNTER — Encounter: Payer: Self-pay | Admitting: Urology

## 2015-09-06 ENCOUNTER — Telehealth: Payer: Self-pay | Admitting: Urology

## 2015-09-06 ENCOUNTER — Ambulatory Visit (INDEPENDENT_AMBULATORY_CARE_PROVIDER_SITE_OTHER): Payer: 59 | Admitting: Urology

## 2015-09-06 VITALS — BP 125/79 | HR 70 | Ht 72.0 in | Wt 194.8 lb

## 2015-09-06 DIAGNOSIS — N401 Enlarged prostate with lower urinary tract symptoms: Secondary | ICD-10-CM

## 2015-09-06 DIAGNOSIS — N528 Other male erectile dysfunction: Secondary | ICD-10-CM

## 2015-09-06 DIAGNOSIS — N138 Other obstructive and reflux uropathy: Secondary | ICD-10-CM

## 2015-09-06 DIAGNOSIS — R972 Elevated prostate specific antigen [PSA]: Secondary | ICD-10-CM | POA: Diagnosis not present

## 2015-09-06 MED ORDER — FINASTERIDE 5 MG PO TABS: 5.0000 mg | ORAL_TABLET | Freq: Every day | ORAL | Status: DC

## 2015-09-06 MED ORDER — TAMSULOSIN HCL 0.4 MG PO CAPS: 0.4000 mg | ORAL_CAPSULE | Freq: Every day | ORAL | Status: DC

## 2015-09-06 NOTE — Progress Notes (Signed)
9:35 AM   Joseph Larson July 12, 1955 [id number]  Referring provider: Idelle Crouch, MD [facility] [place], [place] [address]  Chief Complaint  Patient presents with  . Benign Prostatic Hypertrophy    6 month follow up  . Elevated PSA    HPI: Patient is a 60 year old Caucasian male who has an elevated PSA, erectile dysfunction and BPH with LUTS who presents today for a 6 month follow up.    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  We have been following his PSA's every 6 months.  His most recent PSA being 5.2 ng/mL on 09/02/2015.   Patient states he has not been taking his finasteride 5 mg daily.    Erectile dysfunction Patient's SHIM score is 8 (moderate).  He is not currently very sexually active due to his wife's medical condition.  He states they have sex "once in a blue moon."  When he does have intercourse, it is satisfactory.  His risk factors for ED are age, BPH and smoking.     SHIM      03/08/15 0854       SHIM: Over the last 6 months:   How do you rate your confidence that you could get and keep an erection? Low     When you had erections with sexual stimulation, how often were your erections hard enough for penetration (entering your partner)? Sometimes (about half the time)     During sexual intercourse, how often were you able to maintain your erection after you had penetrated (entered) your partner? Slightly Difficult     During sexual intercourse, how difficult was it to maintain your erection to completion of intercourse? Slightly Difficult     When you attempted sexual intercourse, how often was it satisfactory for you? Slightly Difficult     SHIM Total Score   SHIM 17       BPH WITH LUTS His IPSS score today is 14, which is moderate lower urinary tract symptomatology. He is unhappy with his quality life due to his  urinary symptoms.  His major complaint today urgency.  He has had these symptoms for the last year.  He denies any dysuria, hematuria or suprapubic pain.   He also denies any recent fevers, chills, nausea or vomiting.   He has a family history of PCa, with his brother having prostate cancer.         IPSS      09/06/15 0900       International Prostate Symptom Score   How often have you had the sensation of not emptying your bladder? Not at All     How often have you had to urinate less than every two hours? About half the time     How often have you found you stopped and started again several times when you urinated? Less than 1 in 5 times     How often have you found it difficult to postpone urination? About half the time     How often have you had a weak urinary stream? Less than half the time     How often have you had to strain to start urination? Not at All     How many times did you typically get up at night to urinate? 5 Times     Total  IPSS Score 14     Quality of Life due to urinary symptoms   If you were to spend the rest of your life with your urinary condition just the way it is now how would you feel about that? Unhappy        Score:  1-7 Mild 8-19 Moderate 20-35 Severe   PMH: Past Medical History  Diagnosis Date  . Pancreatitis   . GERD (gastroesophageal reflux disease)   . Anxiety   . Rhinitis     chronic  . ED (erectile dysfunction)   . Arthritis     osteoarthritis  . Neuralgia   . Angina pectoris ([place])   . Rectus diastasis   . Hypertension   . Hyperlipidemia   . Esophageal spasm   . Elevated PSA   . BPH (benign prostatic hyperplasia)   . Erectile dysfunction   . Coronary atherosclerosis     Surgical History: Past Surgical History  Procedure Laterality Date  . Eus  01/17/2012    Procedure: UPPER ENDOSCOPIC ULTRASOUND (EUS) LINEAR;  Surgeon: Milus Banister, MD;  Location: [facility];  Service: Endoscopy;  Laterality: N/A;  radial linear   .  Cardiac catheterization  2010  . Hernia repair      Right inguinal  . Excision morton's neuroma      Left Foot  . Colonoscopy with propofol N/A 07/01/2015    Procedure: COLONOSCOPY WITH PROPOFOL;  Surgeon: Manya Silvas, MD;  Location: [facility] ENDOSCOPY;  Service: Endoscopy;  Laterality: N/A;  . Esophagogastroduodenoscopy (egd) with propofol N/A 07/01/2015    Procedure: ESOPHAGOGASTRODUODENOSCOPY (EGD) WITH PROPOFOL;  Surgeon: Manya Silvas, MD;  Location: [facility] ENDOSCOPY;  Service: Endoscopy;  Laterality: N/A;    Home Medications:    Medication List       This list is accurate as of: 09/06/15  9:35 AM.  Always use your most recent med list.               cyclobenzaprine 10 MG tablet  Commonly known as:  FLEXERIL     finasteride 5 MG tablet  Commonly known as:  PROSCAR  Take 1 tablet (5 mg total) by mouth daily. Reported on 09/06/2015     HYDROcodone-acetaminophen 5-325 MG tablet  Commonly known as:  NORCO/VICODIN  Take by mouth.     metaxalone 800 MG tablet  Commonly known as:  SKELAXIN  Reported on 09/06/2015     nabumetone 500 MG tablet  Commonly known as:  RELAFEN  Take 500 mg by mouth daily.     pantoprazole 40 MG tablet  Commonly known as:  PROTONIX  Take 40 mg by mouth 2 (two) times daily.     polyethylene glycol-electrolytes 420 g solution  Commonly known as:  NuLYTELY/GoLYTELY  Reported on 09/06/2015     sildenafil 100 MG tablet  Commonly known as:  VIAGRA  Take by mouth.     tamsulosin 0.4 MG Caps capsule  Commonly known as:  FLOMAX  Take 1 capsule (0.4 mg total) by mouth daily. Reported on 09/06/2015        Allergies: No Known Allergies  Family History: Family History  Problem Relation Age of Onset  . Prostate cancer Brother   . Kidney disease Neg Hx     Social History:  reports that he has been smoking Cigarettes.  He has a 20 pack-year smoking history. He does not have any smokeless tobacco history on file. He reports that he does not drink  alcohol or use  illicit drugs.  ROS: Urological Symptom Review  Patient is experiencing the following symptoms: Hard to postpone urination Get up at night to urinate Stream starts and stops Weak stream   Review of Systems  Gastrointestinal (upper)  : Negative for upper GI symptoms  Gastrointestinal (lower) : Negative for lower GI symptoms  Constitutional : Fatigue  Skin: Negative for skin symptoms  Eyes: Negative for eye symptoms  Ear/Nose/Throat : Negative for Ear/Nose/Throat symptoms  Hematologic/Lymphatic: Negative for Hematologic/Lymphatic symptoms  Cardiovascular : Negative for cardiovascular symptoms  Respiratory : Negative for respiratory symptoms  Endocrine: Negative for endocrine symptoms  Musculoskeletal: Back pain Joint pain  Neurological: Negative for neurological symptoms  Psychologic: Negative for psychiatric symptoms   Physical Exam: BP 125/79 mmHg  Pulse 70  Ht 6' (1.829 m)  Wt 194 lb 12.8 oz (88.361 kg)  BMI 26.41 kg/m2  GU: Patient with a circumcised phallus.  Urethral meatus is patent.  No penile discharge. No penile lesions or rashes. Scrotum without lesions, cysts, rashes and/or edema.  Testicles are located scrotally bilaterally. No masses are appreciated in the testicles. Left and right epididymis are normal. Rectal: Patient with  normal sphincter tone. Perineum without scarring or rashes. No rectal masses are appreciated. Prostate is approximately 50 grams, firm in the right lobe,  no nodules are appreciated. Seminal vesicles are normal.   Laboratory Data: Lab Results  Component Value Date   WBC 11.8* 09/07/2012   HGB 14.4 09/07/2012   HCT 41.2 09/07/2012   MCV 87 09/07/2012   PLT 181 09/07/2012    Lab Results  Component Value Date   CREATININE 1.65* 09/07/2012    Urinalysis Results for orders placed or performed in visit on 09/02/15  PSA  Result Value Ref Range   Prostate Specific Ag, Serum 5.2 (H) 0.0 - 4.0  ng/mL     Assessment & Plan:   1. Elevated PSA:   Patient underwent a biopsy on 08/28/2013 for a PSA of 5.26 ng/mL and was found to have HGPIN in 2 cores.  Patient's most recent PSA was 5.2 ng/mL on 09/02/2015.  He will start finasteride.  Patient will RTC in 3 months for a PSA and exam.    2. BPH with obstruction/lower urinary tract symptoms:   Patient's IPSS score is 14/5 (moderate).  He is starting finasteride at this time.  He will follow up in 3 months for a PSA, exam and an IPSS score.    3. Erectile dysfunction- Patient's SHIM score is 17 (mild ED).  He is having sex infrequently.  But when he does, he is finding it satisfactory. He is also taking Viagra prescribed by his primary care physician.     Return in about 3 months (around 12/07/2015) for IPSS, PVR and PSA.  Zara Council, [facility] [address], [place], [place] [address] [phone]

## 2015-12-02 ENCOUNTER — Other Ambulatory Visit: Payer: Self-pay

## 2015-12-02 DIAGNOSIS — R972 Elevated prostate specific antigen [PSA]: Secondary | ICD-10-CM

## 2015-12-05 ENCOUNTER — Other Ambulatory Visit: Payer: 59

## 2015-12-05 DIAGNOSIS — R972 Elevated prostate specific antigen [PSA]: Secondary | ICD-10-CM

## 2015-12-06 LAB — PSA: Prostate Specific Ag, Serum: 2.6 ng/mL (ref 0.0–4.0)

## 2015-12-08 ENCOUNTER — Encounter: Payer: Self-pay | Admitting: Urology

## 2015-12-08 ENCOUNTER — Ambulatory Visit (INDEPENDENT_AMBULATORY_CARE_PROVIDER_SITE_OTHER): Payer: 59 | Admitting: Urology

## 2015-12-08 VITALS — BP 123/82 | HR 76 | Ht 72.0 in | Wt 196.3 lb

## 2015-12-08 DIAGNOSIS — Z87898 Personal history of other specified conditions: Secondary | ICD-10-CM | POA: Diagnosis not present

## 2015-12-08 DIAGNOSIS — N528 Other male erectile dysfunction: Secondary | ICD-10-CM

## 2015-12-08 DIAGNOSIS — N138 Other obstructive and reflux uropathy: Secondary | ICD-10-CM

## 2015-12-08 DIAGNOSIS — N529 Male erectile dysfunction, unspecified: Secondary | ICD-10-CM

## 2015-12-08 DIAGNOSIS — N401 Enlarged prostate with lower urinary tract symptoms: Secondary | ICD-10-CM

## 2015-12-08 NOTE — Progress Notes (Signed)
9:28 AM   Joseph Larson 1955-08-19 [id number]  Referring provider: Idelle Crouch, MD [facility] [place], [place] [address]  Chief Complaint  Patient presents with  . Elevated PSA    3 month follow up  . Benign Prostatic Hypertrophy    HPI: Patient is a 60 year old Caucasian male who has a history of elevated PSA (with HGPIN), erectile dysfunction and BPH with LUTS who presents today for a 6 month follow up.    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  We have been following his PSA's every 6 months.  His most recent PSA being 2.6 ng/mL on 12/05/2015.  Erectile dysfunction Patient's SHIM score is 8 (moderate).  He is not currently very sexually active due to his wife's medical condition.  He states they have sex "once in a blue moon."  When he does have intercourse, it is satisfactory.  His risk factors for ED are age, BPH and smoking.     SHIM      03/08/15 0854       SHIM: Over the last 6 months:   How do you rate your confidence that you could get and keep an erection? Low     When you had erections with sexual stimulation, how often were your erections hard enough for penetration (entering your partner)? Sometimes (about half the time)     During sexual intercourse, how often were you able to maintain your erection after you had penetrated (entered) your partner? Slightly Difficult     During sexual intercourse, how difficult was it to maintain your erection to completion of intercourse? Slightly Difficult     When you attempted sexual intercourse, how often was it satisfactory for you? Slightly Difficult     SHIM Total Score   SHIM 17       BPH WITH LUTS His IPSS score today is 12, which is moderate lower urinary tract symptomatology. He is mixed with his quality life due to his urinary symptoms.   His previous IPSS  score was 14/5.   His major complaint today urgency and frequency.  He does drink two large cups of coffee daily.   He has had these symptoms for the several years.  He denies any dysuria, hematuria or suprapubic pain.   He also denies any recent fevers, chills, nausea or vomiting.   He has a family history of PCa, with his brother having prostate cancer.         IPSS    Row Name 12/08/15 0800         International Prostate Symptom Score   How often have you had the sensation of not emptying your bladder? Less than 1 in 5     How often have you had to urinate less than every two hours? More than half the time     How often have you found you stopped and started again several times when you urinated? Less than 1 in 5 times     How often have you found it difficult to postpone urination? More than half the time     How often have you had a weak urinary stream? Less than 1 in 5 times     How often have you had to strain to start urination? Not at All  How many times did you typically get up at night to urinate? 1 Time     Total IPSS Score 12       Quality of Life due to urinary symptoms   If you were to spend the rest of your life with your urinary condition just the way it is now how would you feel about that? Mixed        Score:  1-7 Mild 8-19 Moderate 20-35 Severe   PMH: Past Medical History:  Diagnosis Date  . Angina pectoris ([place])   . Anxiety   . Arthritis    osteoarthritis  . BPH (benign prostatic hyperplasia)   . Coronary atherosclerosis   . ED (erectile dysfunction)   . Elevated PSA   . Erectile dysfunction   . Esophageal spasm   . GERD (gastroesophageal reflux disease)   . Hyperlipidemia   . Hypertension   . Neuralgia   . Pancreatitis   . Rectus diastasis   . Rhinitis    chronic    Surgical History: Past Surgical History:  Procedure Laterality Date  . CARDIAC CATHETERIZATION  2010  . COLONOSCOPY WITH PROPOFOL N/A 07/01/2015   Procedure: COLONOSCOPY  WITH PROPOFOL;  Surgeon: Manya Silvas, MD;  Location: [facility] ENDOSCOPY;  Service: Endoscopy;  Laterality: N/A;  . ESOPHAGOGASTRODUODENOSCOPY (EGD) WITH PROPOFOL N/A 07/01/2015   Procedure: ESOPHAGOGASTRODUODENOSCOPY (EGD) WITH PROPOFOL;  Surgeon: Manya Silvas, MD;  Location: [facility] ENDOSCOPY;  Service: Endoscopy;  Laterality: N/A;  . EUS  01/17/2012   Procedure: UPPER ENDOSCOPIC ULTRASOUND (EUS) LINEAR;  Surgeon: Milus Banister, MD;  Location: [facility] ENDOSCOPY;  Service: Endoscopy;  Laterality: N/A;  radial linear   . EXCISION MORTON'S NEUROMA     Left Foot  . HERNIA REPAIR     Right inguinal    Home Medications:    Medication List       Accurate as of 12/08/15  9:28 AM. Always use your most recent med list.          albuterol 108 (90 Base) MCG/ACT inhaler Commonly known as:  PROVENTIL HFA;VENTOLIN HFA Inhale into the lungs.   cyclobenzaprine 10 MG tablet Commonly known as:  FLEXERIL   finasteride 5 MG tablet Commonly known as:  PROSCAR Take 1 tablet (5 mg total) by mouth daily. Reported on 09/06/2015   HYDROcodone-acetaminophen 5-325 MG tablet Commonly known as:  NORCO/VICODIN Take by mouth.   metaxalone 800 MG tablet Commonly known as:  SKELAXIN Reported on 09/06/2015   nabumetone 500 MG tablet Commonly known as:  RELAFEN Take 500 mg by mouth daily.   pantoprazole 40 MG tablet Commonly known as:  PROTONIX Take 40 mg by mouth 2 (two) times daily.   polyethylene glycol-electrolytes 420 g solution Commonly known as:  NuLYTELY/GoLYTELY Reported on 09/06/2015   ranitidine 300 MG tablet Commonly known as:  ZANTAC Take by mouth.   sildenafil 100 MG tablet Commonly known as:  VIAGRA Take by mouth.   tamsulosin 0.4 MG Caps capsule Commonly known as:  FLOMAX Take 1 capsule (0.4 mg total) by mouth daily. Reported on 09/06/2015       Allergies: No Known Allergies  Family History: Family History  Problem Relation Age of Onset  . Prostate cancer Brother   .  Kidney disease Neg Hx     Social History:  reports that he has been smoking Cigarettes.  He has a 20.00 pack-year smoking history. He has never used smokeless tobacco. He reports that he does not drink alcohol or use  drugs.  ROS: UROLOGY Frequent Urination?: No Hard to postpone urination?: No Burning/pain with urination?: No Get up at night to urinate?: No Leakage of urine?: No Urine stream starts and stops?: No Trouble starting stream?: No Do you have to strain to urinate?: No Blood in urine?: No Urinary tract infection?: No Sexually transmitted disease?: No Injury to kidneys or bladder?: No Painful intercourse?: No Weak stream?: No Erection problems?: No Penile pain?: No Gastrointestinal Nausea?: Yes Vomiting?: No Indigestion/heartburn?: Yes Diarrhea?: No Constipation?: No Constitutional Fever: No Night sweats?: No Weight loss?: No Fatigue?: Yes Skin Skin rash/lesions?: No Itching?: No Eyes Blurred vision?: No Double vision?: No Ears/Nose/Throat Sore throat?: No Sinus problems?: Yes Hematologic/Lymphatic Swollen glands?: No Easy bruising?: No Cardiovascular Leg swelling?: No Chest pain?: No Respiratory Cough?: No Shortness of breath?: Yes Endocrine Excessive thirst?: No Musculoskeletal Back pain?: Yes Joint pain?: No Neurological Headaches?: No Dizziness?: No Psychologic Depression?: No Anxiety?: No     Physical Exam: BP 123/82   Pulse 76   Ht 6' (1.829 m)   Wt 196 lb 4.8 oz (89 kg)   BMI 26.62 kg/m   Constitutional: Well nourished. Alert and oriented, No acute distress. HEENT: [place] AT, moist mucus membranes. Trachea midline, no masses. Cardiovascular: No clubbing, cyanosis, or edema. Respiratory: Normal respiratory effort, no increased work of breathing. GI: Abdomen is soft, non tender, non distended, no abdominal masses. Liver and spleen not palpable.  No hernias appreciated.  Stool sample for occult testing is not indicated.   GU: No  CVA tenderness.  No bladder fullness or masses.  Patient with a circumcised phallus.  Urethral meatus is patent.  No penile discharge. No penile lesions or rashes. Scrotum without lesions, cysts, rashes and/or edema.  Testicles are located scrotally bilaterally. No masses are appreciated in the testicles. Left and right epididymis are normal. Rectal: Patient with  normal sphincter tone. Perineum without scarring or rashes. No rectal masses are appreciated. Prostate is approximately 50 grams, firm in the right lobe,  no nodules are appreciated. Seminal vesicles are normal. Skin: No rashes, bruises or suspicious lesions. Lymph: No cervical or inguinal adenopathy. Neurologic: Grossly intact, no focal deficits, moving all 4 extremities. Psychiatric: Normal mood and affect.  Laboratory Data: Lab Results  Component Value Date   WBC 11.8 (H) 09/07/2012   HGB 14.4 09/07/2012   HCT 41.2 09/07/2012   MCV 87 09/07/2012   PLT 181 09/07/2012    Lab Results  Component Value Date   CREATININE 1.65 (H) 09/07/2012     Results for orders placed or performed in visit on 12/05/15  PSA  Result Value Ref Range   Prostate Specific Ag, Serum 2.6 0.0 - 4.0 ng/mL     Assessment & Plan:   1. History of elevated PSA  - underwent a biopsy on 08/28/2013 for a PSA of 5.26 ng/mL and was found to have HGPIN in 2 cores  - recent PSA was 2.6ng/mL on 12/05/2015  - currently taking finasteride  - RTC in 6 months for a PSA and exam.    2.   BPH with LUTS  - IPSS score is 12/3, it is improving  - Continue conservative management, avoiding bladder irritants and timed voiding's  - Continue tamsulosin 0.4 mg daily and finasteride 5 mg daily; patient may discontinue the tamsulosin  -  RTC in 6 months for IPSS, PSA and exam    3. Erectile dysfunction   - SHIM score was 17 at last visit.  He is wanting further  treatment options for his ED.   I explained to the patient that in order to achieve an erection it takes  good functioning of the nervous system (parasympathetic, sympathetic, sensory and motor), good blood flow into the erectile tissue of the penis and a desire to have sex.   I stated that conditions like diabetes, hypertension, coronary artery disease, peripheral vascular disease, smoking, alcohol consumption, age and BPH can diminish the ability to have an erection.   We discussed intra-urethral suppositories, intracavernous vasoactive drug injection therapy, vacuum constriction device and penile prosthesis implantation.    - failed PDE5-inhibitors  - encouraged patient to quit smoking  - not a good candidate for penile prosthesis due to smoking and pulmonary issues  - not interested in injections or suppositories at this time  - RTC in 6 months for repeat SHIM score and exam   Return in about 6 months (around 06/06/2016) for IPSS, SHIM, PSA and exam.  Zara Council, [facility]  [place] [address] [place], [place] [address] [phone]

## 2015-12-14 MED ORDER — FINASTERIDE 5 MG PO TABS: 5.0000 mg | ORAL_TABLET | Freq: Every day | ORAL | 3 refills | Status: DC

## 2016-03-30 DIAGNOSIS — J439 Emphysema, unspecified: Secondary | ICD-10-CM | POA: Diagnosis not present

## 2016-05-04 DIAGNOSIS — I10 Essential (primary) hypertension: Secondary | ICD-10-CM | POA: Diagnosis not present

## 2016-05-04 DIAGNOSIS — Z0000 Encounter for general adult medical examination without abnormal findings: Secondary | ICD-10-CM | POA: Diagnosis not present

## 2016-05-04 DIAGNOSIS — K219 Gastro-esophageal reflux disease without esophagitis: Secondary | ICD-10-CM | POA: Diagnosis not present

## 2016-05-08 DIAGNOSIS — Z1329 Encounter for screening for other suspected endocrine disorder: Secondary | ICD-10-CM | POA: Diagnosis not present

## 2016-05-08 DIAGNOSIS — Z79899 Other long term (current) drug therapy: Secondary | ICD-10-CM | POA: Diagnosis not present

## 2016-05-08 DIAGNOSIS — I10 Essential (primary) hypertension: Secondary | ICD-10-CM | POA: Diagnosis not present

## 2016-05-08 DIAGNOSIS — M6281 Muscle weakness (generalized): Secondary | ICD-10-CM | POA: Diagnosis not present

## 2016-05-31 ENCOUNTER — Other Ambulatory Visit: Payer: 59

## 2016-05-31 ENCOUNTER — Other Ambulatory Visit: Payer: Self-pay

## 2016-05-31 DIAGNOSIS — R972 Elevated prostate specific antigen [PSA]: Secondary | ICD-10-CM

## 2016-06-01 LAB — PSA: Prostate Specific Ag, Serum: 2.4 ng/mL (ref 0.0–4.0)

## 2016-06-05 NOTE — Progress Notes (Signed)
9:04 AM   Joseph Larson 1955/04/21 [id number]  Referring provider: Idelle Crouch, MD [facility] [place], [place] [address]  Chief Complaint  Patient presents with  . Benign Prostatic Hypertrophy    6 month follow up     HPI: Patient is a 61 year old Caucasian male who has a history of elevated PSA (with HGPIN), erectile dysfunction and BPH with LUTS who presents today for a 6 month follow up.    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  We have been following his PSA's every 6 months.  His most recent PSA being 2.4ng/mL on 05/31/2016.  Erectile dysfunction His SHIM score is 5, which is severe.  His previous SHIM score was 8.  He has not had sex in 6 months.   His major complaint is not having a willing partner.  His libido is preserved.   His risk factors for ED are age, BPH, smoking, and pain medication.  He denies any painful erections or curvatures with his erections.   He is still having/no longer having spontaneous erections.  He has tried Viagra in the past and found it ineffective.       SHIM    Row Name 06/06/16 0834         SHIM: Over the last 6 months:   How do you rate your confidence that you could get and keep an erection? Very Low     When you had erections with sexual stimulation, how often were your erections hard enough for penetration (entering your partner)? Almost Never or Never     During sexual intercourse, how often were you able to maintain your erection after you had penetrated (entered) your partner? Extremely Difficult     During sexual intercourse, how difficult was it to maintain your erection to completion of intercourse? Extremely Difficult     When you attempted sexual intercourse, how often was it satisfactory for you? Extremely Difficult       SHIM Total Score   SHIM 5        Score: 1-7  Severe ED 8-11 Moderate ED 12-16 Mild-Moderate ED 17-21 Mild ED 22-25 No ED  BPH WITH LU TS. His IPSS score today is 8, which is moderate lower urinary tract symptomatology. He is mixed with his quality life due to his urinary symptoms.   His previous IPSS score was 12/3.   His major complaint today urgency and frequency.  He does drink two large cups of coffee daily.   He has had these symptoms for the several years.  He denies any dysuria, hematuria or suprapubic pain.   He also denies any recent fevers, chills, nausea or vomiting.   He has a family history of PCa, with his brother having prostate cancer.       IPSS    Row Name 06/06/16 0800         International Prostate Symptom Score   How often have you had the sensation of not emptying your bladder? Less than 1 in 5     How often have you had to urinate less than every two hours? About half the time     How often have you found you stopped and started again several times when you urinated? Less than 1 in 5 times     How  often have you found it difficult to postpone urination? Less than half the time     How often have you had a weak urinary stream? Not at All     How often have you had to strain to start urination? Not at All     How many times did you typically get up at night to urinate? 1 Time     Total IPSS Score 8       Quality of Life due to urinary symptoms   If you were to spend the rest of your life with your urinary condition just the way it is now how would you feel about that? Mixed        Score:  1-7 Mild 8-19 Moderate 20-35 Severe  Screening for low testosterone Patient is experiencing a decrease in libido, a lack of energy, a decrease in strength, a decreased enjoyment in life, erections being less strong and a recent deterioration in an ability to play sports.  This is indicated by his responses to the ADAM questionnaire. He no longer having spontaneous erections at night.   He does not have sleep apnea.          Androgen Deficiency in the Aging Male    [place] Name 06/06/16 0800         Androgen Deficiency in the Aging Male   Do you have a decrease in libido (sex drive) Yes     Do you have lack of energy Yes     Do you have a decrease in strength and/or endurance Yes     Have you lost height No     Have you noticed a decreased "enjoyment of life" Yes     Are you sad and/or grumpy No     Are your erections less strong Yes     Have you noticed a recent deterioration in your ability to play sports Yes     Are you falling asleep after dinner No     Has there been a recent deterioration in your work performance No        PMH: Past Medical History:  Diagnosis Date  . Angina pectoris ([place])   . Anxiety   . Arthritis    osteoarthritis  . BPH (benign prostatic hyperplasia)   . Coronary atherosclerosis   . ED (erectile dysfunction)   . Elevated PSA   . Erectile dysfunction   . Esophageal spasm   . GERD (gastroesophageal reflux disease)   . Hyperlipidemia   . Hypertension   . Neuralgia   . Pancreatitis   . Rectus diastasis   . Rhinitis    chronic    Surgical History: Past Surgical History:  Procedure Laterality Date  . CARDIAC CATHETERIZATION  2010  . CHOLECYSTECTOMY    . COLONOSCOPY WITH PROPOFOL N/A 07/01/2015   Procedure: COLONOSCOPY WITH PROPOFOL;  Surgeon: Manya Silvas, MD;  Location: [facility] ENDOSCOPY;  Service: Endoscopy;  Laterality: N/A;  . ESOPHAGOGASTRODUODENOSCOPY (EGD) WITH PROPOFOL N/A 07/01/2015   Procedure: ESOPHAGOGASTRODUODENOSCOPY (EGD) WITH PROPOFOL;  Surgeon: Manya Silvas, MD;  Location: [facility] ENDOSCOPY;  Service: Endoscopy;  Laterality: N/A;  . EUS  01/17/2012   Procedure: UPPER ENDOSCOPIC ULTRASOUND (EUS) LINEAR;  Surgeon: Milus Banister, MD;  Location: [facility] ENDOSCOPY;  Service: Endoscopy;  Laterality: N/A;  radial linear   . EXCISION MORTON'S NEUROMA     Left Foot  . HERNIA REPAIR     Right inguinal    Home Medications:  Allergies as of 06/06/2016  No Known Allergies     Medication List       Accurate as of 06/06/16  9:04 AM. Always use your most recent med list.          albuterol 108 (90 Base) MCG/ACT inhaler Commonly known as:  PROVENTIL HFA;VENTOLIN HFA Inhale into the lungs.   azelastine 0.1 % nasal spray Commonly known as:  ASTELIN Place into the nose.   cyclobenzaprine 10 MG tablet Commonly known as:  FLEXERIL   finasteride 5 MG tablet Commonly known as:  PROSCAR Take 1 tablet (5 mg total) by mouth daily. Reported on 09/06/2015   HYDROcodone-acetaminophen 5-325 MG tablet Commonly known as:  NORCO/VICODIN Take by mouth.   metaxalone 800 MG tablet Commonly known as:  SKELAXIN Reported on 09/06/2015   nabumetone 500 MG tablet Commonly known as:  RELAFEN Take 500 mg by mouth daily.   pantoprazole 40 MG tablet Commonly known as:  PROTONIX Take 40 mg by mouth 2 (two) times daily.   polyethylene glycol-electrolytes 420 g solution Commonly known as:  NuLYTELY/GoLYTELY Reported on 09/06/2015   ranitidine 300 MG tablet Commonly known as:  ZANTAC Take by mouth.   sildenafil 100 MG tablet Commonly known as:  VIAGRA Take by mouth.   tamsulosin 0.4 MG Caps capsule Commonly known as:  FLOMAX Take 1 capsule (0.4 mg total) by mouth daily. Reported on 09/06/2015       Allergies: No Known Allergies  Family History: Family History  Problem Relation Age of Onset  . Prostate cancer Brother   . Kidney disease Neg Hx   . Kidney cancer Neg Hx   . Bladder Cancer Neg Hx     Social History:  reports that he has been smoking Cigarettes.  He has a 20.00 pack-year smoking history. He has never used smokeless tobacco. He reports that he does not drink alcohol or use drugs.  ROS: UROLOGY Frequent Urination?: No Hard to postpone urination?: No Burning/pain with urination?: No Get up at night to urinate?: No Leakage of urine?: No Urine stream starts and stops?: No Trouble starting stream?: No Do you have to  strain to urinate?: No Blood in urine?: No Urinary tract infection?: No Sexually transmitted disease?: No Injury to kidneys or bladder?: No Painful intercourse?: No Weak stream?: No Erection problems?: Yes Penile pain?: No Gastrointestinal Nausea?: No Vomiting?: No Indigestion/heartburn?: No Diarrhea?: No Constipation?: No Constitutional Fever: No Night sweats?: No Weight loss?: No Fatigue?: No Skin Skin rash/lesions?: No Itching?: No Eyes Blurred vision?: No Double vision?: No Ears/Nose/Throat Sore throat?: No Sinus problems?: No Hematologic/Lymphatic Swollen glands?: No Easy bruising?: No Cardiovascular Leg swelling?: No Chest pain?: No Respiratory Cough?: No Shortness of breath?: No Endocrine Excessive thirst?: No Musculoskeletal Back pain?: No Joint pain?: No Neurological Headaches?: No Dizziness?: No Psychologic Depression?: No Anxiety?: No  Physical Exam: BP 137/79   Pulse 69   Ht 6' 0.5" (1.842 m)   Wt 197 lb 12.8 oz (89.7 kg)   BMI 26.46 kg/m   Constitutional: Well nourished. Alert and oriented, No acute distress. HEENT: [place] AT, moist mucus membranes. Trachea midline, no masses. Cardiovascular: No clubbing, cyanosis, or edema. Respiratory: Normal respiratory effort, no increased work of breathing. GI: Abdomen is soft, non tender, non distended, no abdominal masses. Liver and spleen not palpable.  No hernias appreciated.  Stool sample for occult testing is not indicated.   GU: No CVA tenderness.  No bladder fullness or masses.  Patient with a circumcised phallus.  Urethral meatus is patent.  No penile discharge. No penile lesions or rashes. Scrotum without lesions, cysts, rashes and/or edema.  Testicles are located scrotally bilaterally. No masses are appreciated in the testicles. Left and right epididymis are normal. Rectal: Patient with  normal sphincter tone. Perineum without scarring or rashes. No rectal masses are appreciated. Prostate is  approximately 50 grams, firm in the right lobe,  no nodules are appreciated. Seminal vesicles are normal. Skin: No rashes, bruises or suspicious lesions. Lymph: No cervical or inguinal adenopathy. Neurologic: Grossly intact, no focal deficits, moving all 4 extremities. Psychiatric: Normal mood and affect.  Laboratory Data: Lab Results  Component Value Date   WBC 11.8 (H) 09/07/2012   HGB 14.4 09/07/2012   HCT 41.2 09/07/2012   MCV 87 09/07/2012   PLT 181 09/07/2012    Lab Results  Component Value Date   CREATININE 1.65 (H) 09/07/2012  PSA history  4.5 ng/mL on 09/06/2014  4.5 ng/mL on 03/01/2015  5.2 ng/mL on 09/02/2015 - started finasteride  2.6 ng/mL on 12/05/2015 Results for orders placed or performed in visit on 05/31/16  PSA  Result Value Ref Range   Prostate Specific Ag, Serum 2.4 0.0 - 4.0 ng/mL     Assessment & Plan:   1. History of elevated PSA  - underwent a biopsy on 08/28/2013 for a PSA of 5.26 ng/mL and was found to have HGPIN in 2 cores  - recent PSA was 2.4 ng/mL on 05/31/2016  - currently taking finasteride; refill given  - RTC in 6 months for a PSA and exam.    2. Erectile dysfunction   - SHIM score was 5  - He is wanting further treatment options for his ED.   I explained to the patient that in order to achieve an erection it takes good functioning of the nervous system (parasympathetic, sympathetic, sensory and motor), good blood flow into the erectile tissue of the penis and a desire to have sex.   I stated that conditions like diabetes, hypertension, coronary artery disease, peripheral vascular disease, smoking, alcohol consumption, age and BPH can diminish the ability to have an erection.   We discussed intra-urethral suppositories, intracavernous vasoactive drug injection therapy, vacuum constriction device and penile prosthesis implantation.    - failed PDE5-inhibitors  - encouraged patient to quit smoking  - not a good candidate for penile  prosthesis due to smoking and pulmonary issues  - not interested in injections or suppositories at this time  - RTC in 6 months for repeat SHIM score and exam  3.   BPH with LUTS  - IPSS score is 8/3, it is improving  - Continue conservative management, avoiding bladder irritants and timed voiding's  - Continue finasteride 5 mg daily; refills given  -  RTC in 6 months for IPSS, PSA and exam   4. Screening for low testosterone   - I explained to patient that the current recommendations from the Endocrine Society reports the diagnosis of hypogonadism requires a serum total testosterone level obtained between 8 and 10 AM at least 2 days apart that is below the laboratory parameters  for normal testosterone.     - At this time, the patient does not meet this requirement.  He will have a morning testosterone drawn today.  RTC pending testosterone level.       Return in about 6 months (around 12/07/2016) for IPSS, SHIM, PSA and exam.  Zara Council, [facility]  [facility] [address], [place], [place] [address] [phone]

## 2016-06-06 ENCOUNTER — Encounter: Payer: Self-pay | Admitting: Urology

## 2016-06-06 ENCOUNTER — Ambulatory Visit: Payer: 59 | Admitting: Urology

## 2016-06-06 VITALS — BP 137/79 | HR 69 | Ht 72.5 in | Wt 197.8 lb

## 2016-06-06 DIAGNOSIS — Z87898 Personal history of other specified conditions: Secondary | ICD-10-CM | POA: Diagnosis not present

## 2016-06-06 DIAGNOSIS — Z1329 Encounter for screening for other suspected endocrine disorder: Secondary | ICD-10-CM

## 2016-06-06 DIAGNOSIS — N138 Other obstructive and reflux uropathy: Secondary | ICD-10-CM | POA: Diagnosis not present

## 2016-06-06 DIAGNOSIS — N529 Male erectile dysfunction, unspecified: Secondary | ICD-10-CM

## 2016-06-06 DIAGNOSIS — N401 Enlarged prostate with lower urinary tract symptoms: Secondary | ICD-10-CM | POA: Diagnosis not present

## 2016-06-06 MED ORDER — FINASTERIDE 5 MG PO TABS: 5.0000 mg | ORAL_TABLET | Freq: Every day | ORAL | 3 refills | Status: DC

## 2016-06-07 ENCOUNTER — Telehealth: Payer: Self-pay

## 2016-06-07 LAB — TESTOSTERONE: TESTOSTERONE: 522 ng/dL (ref 264–916)

## 2016-06-07 NOTE — Telephone Encounter (Signed)
-----   Message from Nori Riis, PA-C sent at 06/07/2016  7:56 AM EDT ----- Please tell the patient that his testosterone is normal.  We will see him in 6 months.

## 2016-06-07 NOTE — Telephone Encounter (Signed)
Spoke with pt in reference to lab results. Pt voiced understanding.  

## 2016-10-04 DIAGNOSIS — Z23 Encounter for immunization: Secondary | ICD-10-CM | POA: Diagnosis not present

## 2016-10-04 DIAGNOSIS — J432 Centrilobular emphysema: Secondary | ICD-10-CM | POA: Diagnosis not present

## 2016-10-04 DIAGNOSIS — R05 Cough: Secondary | ICD-10-CM | POA: Diagnosis not present

## 2016-10-04 DIAGNOSIS — J310 Chronic rhinitis: Secondary | ICD-10-CM | POA: Diagnosis not present

## 2016-10-30 DIAGNOSIS — Z79899 Other long term (current) drug therapy: Secondary | ICD-10-CM | POA: Diagnosis not present

## 2016-10-30 DIAGNOSIS — I10 Essential (primary) hypertension: Secondary | ICD-10-CM | POA: Diagnosis not present

## 2016-10-30 DIAGNOSIS — E782 Mixed hyperlipidemia: Secondary | ICD-10-CM | POA: Diagnosis not present

## 2016-10-30 DIAGNOSIS — M5136 Other intervertebral disc degeneration, lumbar region: Secondary | ICD-10-CM | POA: Diagnosis not present

## 2016-11-21 DIAGNOSIS — D485 Neoplasm of uncertain behavior of skin: Secondary | ICD-10-CM | POA: Diagnosis not present

## 2016-11-21 DIAGNOSIS — L570 Actinic keratosis: Secondary | ICD-10-CM | POA: Diagnosis not present

## 2016-11-21 DIAGNOSIS — Z85828 Personal history of other malignant neoplasm of skin: Secondary | ICD-10-CM | POA: Diagnosis not present

## 2016-11-21 DIAGNOSIS — D0461 Carcinoma in situ of skin of right upper limb, including shoulder: Secondary | ICD-10-CM | POA: Diagnosis not present

## 2016-12-04 ENCOUNTER — Other Ambulatory Visit: Payer: 59

## 2016-12-04 DIAGNOSIS — R972 Elevated prostate specific antigen [PSA]: Secondary | ICD-10-CM | POA: Diagnosis not present

## 2016-12-05 LAB — PSA: Prostate Specific Ag, Serum: 1.9 ng/mL (ref 0.0–4.0)

## 2016-12-05 NOTE — Progress Notes (Signed)
9:35 AM   Joseph Larson 1955-10-10 [id number]  Referring provider: Idelle Crouch, MD [facility] [place], [place] [address]  Chief Complaint  Patient presents with  . Elevated PSA    6 month follow up  . Erectile Dysfunction  . Benign Prostatic Hypertrophy    HPI: Patient is a 61 year old Caucasian male who has a history of elevated PSA (with HGPIN), erectile dysfunction and BPH with LUTS who presents today for a 6 month follow up.    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  We have been following his PSA's every 6 months.  His most recent PSA 1.9 ng/mL on 12/04/2016.    Erectile dysfunction His SHIM score is 5, which is severe ED.  His previous SHIM score was 5.  He has not had sex in 6 months.   His major complaint is not having a willing partner.  His libido is preserved.   His risk factors for ED are age, BPH, smoking, and pain medication.  He denies any painful erections or curvatures with his erections.   He is no longer having spontaneous erections.  He has tried Viagra in the past and found it ineffective.       SHIM    Row Name 12/06/16 0913         SHIM: Over the last 6 months:   How do you rate your confidence that you could get and keep an erection? Very Low     When you had erections with sexual stimulation, how often were your erections hard enough for penetration (entering your partner)? Almost Never or Never     During sexual intercourse, how often were you able to maintain your erection after you had penetrated (entered) your partner? Almost Never or Never     During sexual intercourse, how difficult was it to maintain your erection to completion of intercourse? Extremely Difficult     When you attempted sexual intercourse, how often was it satisfactory for you? Almost Never or Never       SHIM Total Score   SHIM 5        Score: 1-7 Severe ED 8-11 Moderate ED 12-16 Mild-Moderate ED 17-21 Mild ED 22-25 No ED  BPH WITH LU TS. His IPSS score today is 17, which is moderate lower urinary tract symptomatology. He is mostly dissatisfied with his quality life due to his urinary symptoms.   His previous IPSS score was 8/3.   His major complaints today are nocturia and intermittency at night.   He does drink two large cups of coffee daily.  He has had these symptoms for the 6 months.  He denies any dysuria, hematuria or suprapubic pain.   He also denies any recent fevers, chills, nausea or vomiting.   He has a family history of PCa, with his brother having prostate cancer.       IPSS    Row Name 12/06/16 0900         International Prostate Symptom Score   How often have you had the sensation of not emptying your bladder? Less than half the time     How often have you had to urinate less than every two hours? About half the time     How often have you found you stopped and started again  several times when you urinated? Less than 1 in 5 times     How often have you found it difficult to postpone urination? Almost always     How often have you had a weak urinary stream? More than half the time     How often have you had to strain to start urination? Not at All     How many times did you typically get up at night to urinate? 2 Times     Total IPSS Score 17       Quality of Life due to urinary symptoms   If you were to spend the rest of your life with your urinary condition just the way it is now how would you feel about that? Mostly Disatisfied        Score:  1-7 Mild 8-19 Moderate 20-35 Severe   PMH: Past Medical History:  Diagnosis Date  . Angina pectoris ([place])   . Anxiety   . Arthritis    osteoarthritis  . BPH (benign prostatic hyperplasia)   . Coronary atherosclerosis   . ED (erectile dysfunction)   . Elevated PSA   . Erectile dysfunction   . Esophageal spasm   . GERD  (gastroesophageal reflux disease)   . Hyperlipidemia   . Hypertension   . Neuralgia   . Pancreatitis   . Rectus diastasis   . Rhinitis    chronic    Surgical History: Past Surgical History:  Procedure Laterality Date  . arthroscopic shoulder Left   . CARDIAC CATHETERIZATION  2010  . CHOLECYSTECTOMY    . COLONOSCOPY WITH PROPOFOL N/A 07/01/2015   Procedure: COLONOSCOPY WITH PROPOFOL;  Surgeon: Manya Silvas, MD;  Location: [facility] ENDOSCOPY;  Service: Endoscopy;  Laterality: N/A;  . ESOPHAGOGASTRODUODENOSCOPY (EGD) WITH PROPOFOL N/A 07/01/2015   Procedure: ESOPHAGOGASTRODUODENOSCOPY (EGD) WITH PROPOFOL;  Surgeon: Manya Silvas, MD;  Location: [facility] ENDOSCOPY;  Service: Endoscopy;  Laterality: N/A;  . EUS  01/17/2012   Procedure: UPPER ENDOSCOPIC ULTRASOUND (EUS) LINEAR;  Surgeon: Milus Banister, MD;  Location: [facility] ENDOSCOPY;  Service: Endoscopy;  Laterality: N/A;  radial linear   . EXCISION MORTON'S NEUROMA     Left Foot  . HERNIA REPAIR     Right inguinal    Home Medications:  Allergies as of 12/06/2016   No Known Allergies     Medication List       Accurate as of 12/06/16  9:35 AM. Always use your most recent med list.          albuterol 108 (90 Base) MCG/ACT inhaler Commonly known as:  PROVENTIL HFA;VENTOLIN HFA Inhale into the lungs.   azelastine 0.1 % nasal spray Commonly known as:  ASTELIN Place into the nose.   cyclobenzaprine 10 MG tablet Commonly known as:  FLEXERIL   finasteride 5 MG tablet Commonly known as:  PROSCAR Take 1 tablet (5 mg total) by mouth daily. Reported on 09/06/2015   HYDROcodone-acetaminophen 5-325 MG tablet Commonly known as:  NORCO/VICODIN Take by mouth.   metaxalone 800 MG tablet Commonly known as:  SKELAXIN Reported on 09/06/2015   nabumetone 500 MG tablet Commonly known as:  RELAFEN Take 500 mg by mouth daily.   pantoprazole 40 MG tablet Commonly known as:  PROTONIX Take 40 mg by mouth 2 (two) times daily.     polyethylene glycol-electrolytes 420 g solution Commonly known as:  NuLYTELY/GoLYTELY Reported on 09/06/2015   predniSONE 10 MG tablet Commonly known as:  DELTASONE Taper 6-6-5-5-4-4-3-3-2-2-1-1-off   ranitidine 300  MG tablet Commonly known as:  ZANTAC Take by mouth.   sildenafil 100 MG tablet Commonly known as:  VIAGRA Take by mouth.   solifenacin 5 MG tablet Commonly known as:  VESICARE Take 1 tablet (5 mg total) by mouth daily.   tamsulosin 0.4 MG Caps capsule Commonly known as:  FLOMAX Take 1 capsule (0.4 mg total) by mouth daily. Reported on 09/06/2015            Discharge Care Instructions        Start     Ordered   12/06/16 0000  solifenacin (VESICARE) 5 MG tablet  Daily    Question:  Supervising Provider  Answer:  Hollice Espy   12/06/16 [id number]      Allergies: No Known Allergies  Family History: Family History  Problem Relation Age of Onset  . Prostate cancer Brother   . Kidney disease Neg Hx   . Kidney cancer Neg Hx   . Bladder Cancer Neg Hx     Social History:  reports that he quit smoking about 2 months ago. His smoking use included Cigarettes. He has a 20.00 pack-year smoking history. He has never used smokeless tobacco. He reports that he does not drink alcohol or use drugs.  ROS: UROLOGY Frequent Urination?: No Hard to postpone urination?: Yes Burning/pain with urination?: No Get up at night to urinate?: Yes Leakage of urine?: No Urine stream starts and stops?: Yes Trouble starting stream?: No Do you have to strain to urinate?: No Blood in urine?: No Urinary tract infection?: No Sexually transmitted disease?: No Injury to kidneys or bladder?: No Painful intercourse?: No Weak stream?: No Erection problems?: Yes Penile pain?: No Gastrointestinal Nausea?: No Vomiting?: No Indigestion/heartburn?: No Diarrhea?: No Constipation?: No Constitutional Fever: No Night sweats?: No Weight loss?: No Fatigue?: No Skin Skin  rash/lesions?: No Itching?: No Eyes Blurred vision?: No Double vision?: No Ears/Nose/Throat Sore throat?: No Sinus problems?: No Hematologic/Lymphatic Swollen glands?: No Easy bruising?: No Cardiovascular Leg swelling?: No Chest pain?: No Respiratory Cough?: No Shortness of breath?: No Endocrine Excessive thirst?: No Musculoskeletal Back pain?: No Joint pain?: No Neurological Headaches?: No Dizziness?: No Psychologic Depression?: No Anxiety?: No  Physical Exam: BP (!) 150/81   Pulse 65   Ht 6' (1.829 m)   Wt 205 lb 4.8 oz (93.1 kg)   BMI 27.84 kg/m   Constitutional: Well nourished. Alert and oriented, No acute distress. HEENT: [place] AT, moist mucus membranes. Trachea midline, no masses. Cardiovascular: No clubbing, cyanosis, or edema. Respiratory: Normal respiratory effort, no increased work of breathing. GI: Abdomen is soft, non tender, non distended, no abdominal masses. Liver and spleen not palpable.  No hernias appreciated.  Stool sample for occult testing is not indicated.   GU: No CVA tenderness.  No bladder fullness or masses.  Patient with a circumcised phallus.  Urethral meatus is patent.  No penile discharge. No penile lesions or rashes. Scrotum without lesions, cysts, rashes and/or edema.  Testicles are located scrotally bilaterally. No masses are appreciated in the testicles. Left and right epididymis are normal. Rectal: Patient with  normal sphincter tone. Perineum without scarring or rashes. No rectal masses are appreciated. Prostate is approximately 50 grams, no nodules are appreciated. Seminal vesicles are normal. Skin: No rashes, bruises or suspicious lesions. Lymph: No cervical or inguinal adenopathy. Neurologic: Grossly intact, no focal deficits, moving all 4 extremities. Psychiatric: Normal mood and affect.  Laboratory Data: PSA history  4.5 ng/mL on 09/06/2014  4.5 ng/mL on 03/01/2015  5.2 ng/mL on 09/02/2015 - started finasteride  2.6 ng/mL on  12/05/2015 Results for orders placed or performed in visit on 12/04/16  PSA  Result Value Ref Range   Prostate Specific Ag, Serum 1.9 0.0 - 4.0 ng/mL   TSH 2.843   On 05/08/2016  Testosterone 522 ng/dL on 06/06/2016   I have reviewed the labs  Assessment & Plan:   1. 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    2. Erectile dysfunction   - SHIM score was 5, it is stable  - He is wanting further treatment options for his ED  - failed PDE5-inhibitors  - quit smoking 2 months ago  - not a good candidate for penile prosthesis due to smoking and pulmonary issues  - interested in injections at this time - explained to the patient are procedure for tri-mix titration, the short expiration date of the vial medication and the need to have his blood pressure under 140/90 over will not be able to proceed with titration  - RTC for tri-mix titration  3.   BPH with LUTS  - IPSS score is 17/4, it is worsening  - Continue conservative management, avoiding bladder irritants and timed voiding's  - most bothersome symptoms are urgency and nocturia  - Continue finasteride 5 mg daily  - RTC in 6 months for IPSS, PSA and exam   4. Urgency  - most bothersome symptom at this time  - I have advised of the side effects of Vesicare 5 mg, such as: Dry eyes, dry mouth, constipation, mental confusion and/or urinary retention.  5. Nocturia  - completed sleep study in 05/2015 - no sleep apnea  - see above    Return in about 6 months (around 06/05/2017) for IPSS, SHIM, PSA and exam.  Zara Council, [facility]  [place] [address] [place], [place] [address] [phone]

## 2016-12-06 ENCOUNTER — Ambulatory Visit (INDEPENDENT_AMBULATORY_CARE_PROVIDER_SITE_OTHER): Payer: 59 | Admitting: Urology

## 2016-12-06 ENCOUNTER — Telehealth: Payer: Self-pay | Admitting: Urology

## 2016-12-06 ENCOUNTER — Encounter: Payer: Self-pay | Admitting: Urology

## 2016-12-06 VITALS — BP 150/81 | HR 65 | Ht 72.0 in | Wt 205.3 lb

## 2016-12-06 DIAGNOSIS — N401 Enlarged prostate with lower urinary tract symptoms: Secondary | ICD-10-CM | POA: Diagnosis not present

## 2016-12-06 DIAGNOSIS — R351 Nocturia: Secondary | ICD-10-CM

## 2016-12-06 DIAGNOSIS — Z87898 Personal history of other specified conditions: Secondary | ICD-10-CM

## 2016-12-06 DIAGNOSIS — N529 Male erectile dysfunction, unspecified: Secondary | ICD-10-CM

## 2016-12-06 DIAGNOSIS — R3915 Urgency of urination: Secondary | ICD-10-CM

## 2016-12-06 DIAGNOSIS — N138 Other obstructive and reflux uropathy: Secondary | ICD-10-CM | POA: Diagnosis not present

## 2016-12-06 MED ORDER — SOLIFENACIN SUCCINATE 5 MG PO TABS: 5.0000 mg | ORAL_TABLET | Freq: Every day | ORAL | 0 refills | Status: DC

## 2016-12-06 NOTE — Patient Instructions (Signed)
You have been scheduled for a Trimix titration.  This appointment will consist of injecting small doses of Trimix into your penis until the appropriate dose is achieved.  This appointment will be scheduled in the morning and plan on being in the clinic for at least 2 hours.  The Trimix vial has a short shelf life and will expire 3 days from the time you pickup the medication from the pharmacy. It is because of this reason, we suggest you obtain the medication the day before your appointment.  It is also recommended that if you have a history of high blood pressure to get your blood pressure under control as we cannot inject the Trimix in patient's with a blood pressure over 140/90.  If you are found to have high blood pressure at the time of your appointment, we will need to reschedule until your blood pressure is under better control.

## 2016-12-06 NOTE — Telephone Encounter (Signed)
Spoke w/Misty at [employer] and called in test dose vial, will contact patient to pick up

## 2016-12-06 NOTE — Telephone Encounter (Signed)
Would you call in Trimix test vial for this patient to [place]?

## 2017-01-07 DIAGNOSIS — D0461 Carcinoma in situ of skin of right upper limb, including shoulder: Secondary | ICD-10-CM | POA: Diagnosis not present

## 2017-03-14 DIAGNOSIS — J310 Chronic rhinitis: Secondary | ICD-10-CM | POA: Diagnosis not present

## 2017-03-14 DIAGNOSIS — J209 Acute bronchitis, unspecified: Secondary | ICD-10-CM | POA: Diagnosis not present

## 2017-05-06 DIAGNOSIS — Z79899 Other long term (current) drug therapy: Secondary | ICD-10-CM | POA: Diagnosis not present

## 2017-05-06 DIAGNOSIS — Z0000 Encounter for general adult medical examination without abnormal findings: Secondary | ICD-10-CM | POA: Diagnosis not present

## 2017-05-06 DIAGNOSIS — I10 Essential (primary) hypertension: Secondary | ICD-10-CM | POA: Diagnosis not present

## 2017-05-06 DIAGNOSIS — E782 Mixed hyperlipidemia: Secondary | ICD-10-CM | POA: Diagnosis not present

## 2017-05-24 ENCOUNTER — Other Ambulatory Visit: Payer: Self-pay

## 2017-05-24 DIAGNOSIS — Z87898 Personal history of other specified conditions: Secondary | ICD-10-CM

## 2017-05-27 ENCOUNTER — Other Ambulatory Visit: Payer: 59

## 2017-05-27 DIAGNOSIS — Z87898 Personal history of other specified conditions: Secondary | ICD-10-CM

## 2017-05-27 DIAGNOSIS — M19012 Primary osteoarthritis, left shoulder: Secondary | ICD-10-CM | POA: Diagnosis not present

## 2017-05-27 DIAGNOSIS — M7532 Calcific tendinitis of left shoulder: Secondary | ICD-10-CM | POA: Diagnosis not present

## 2017-05-28 LAB — PSA: PROSTATE SPECIFIC AG, SERUM: 2.5 ng/mL (ref 0.0–4.0)

## 2017-06-04 NOTE — Progress Notes (Signed)
9:22 AM   Joseph Larson 1955-11-21 [id number]  Referring provider: Idelle Crouch, MD [facility] [place], [place] [address]  Chief Complaint  Patient presents with  . Follow-up    HPI: Patient is a 62 year old Caucasian male who has a history of elevated PSA (with HGPIN), erectile dysfunction and BPH with LUTS who presents today for a 6 month follow up.    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  We have been following his PSA's every 6 months.  His current PSA is 2.5 ng/mL on 05/27/2017.     Erectile dysfunction His SHIM score is 5, which is severe ED.  His previous SHIM score was 5.  He has not had sex in 6 months.   His major complaint is not having a willing partner.  His libido is preserved.   His risk factors for ED are age, BPH, smoking, and pain medication.  He denies any painful erections or curvatures with his erections.   He is no longer having spontaneous erections.  He has tried Viagra in the past and found it ineffective.   SHIM    Row Name 06/05/17 0855         SHIM: Over the last 6 months:   How do you rate your confidence that you could get and keep an erection?  Very Low     When you had erections with sexual stimulation, how often were your erections hard enough for penetration (entering your partner)?  Almost Never or Never     During sexual intercourse, how often were you able to maintain your erection after you had penetrated (entered) your partner?  Almost Never or Never     During sexual intercourse, how difficult was it to maintain your erection to completion of intercourse?  Extremely Difficult     When you attempted sexual intercourse, how often was it satisfactory for you?  Almost Never or Never       SHIM Total Score   SHIM  5        Score: 1-7 Severe ED 8-11 Moderate ED 12-16 Mild-Moderate  ED 17-21 Mild ED 22-25 No ED  BPH WITH LU TS. His IPSS score today is 12, which is moderate lower urinary tract symptomatology. He is mixed with his quality life due to his urinary symptoms.   His previous IPSS score was 17/4.  His major complaints today are nocturia and intermittency at night.   He does drink two to three large cups of coffee daily.  He is not drinking water until the evening.  These are baseline.  He denies any dysuria, hematuria or suprapubic pain.   He also denies any recent fevers, chills, nausea or vomiting.   He has a family history of PCa, with his brother having prostate cancer.  He is taking the finasteride.  He is not taking the Vesicare.  IPSS    Row Name 06/05/17 0800         International Prostate Symptom Score   How often have you had the sensation of not emptying your bladder?  Not at All     How often have you had to urinate less than every two hours?  About half the time     How often have you found you stopped and started again several times  when you urinated?  Less than half the time     How often have you found it difficult to postpone urination?  About half the time     How often have you had a weak urinary stream?  About half the time     How often have you had to strain to start urination?  Not at All     How many times did you typically get up at night to urinate?  1 Time     Total IPSS Score  12       Quality of Life due to urinary symptoms   If you were to spend the rest of your life with your urinary condition just the way it is now how would you feel about that?  Mixed        Score:  1-7 Mild 8-19 Moderate 20-35 Severe   PMH: Past Medical History:  Diagnosis Date  . Angina pectoris ([place])   . Anxiety   . Arthritis    osteoarthritis  . BPH (benign prostatic hyperplasia)   . Coronary atherosclerosis   . ED (erectile dysfunction)   . Elevated PSA   . Erectile dysfunction   . Esophageal spasm   . GERD (gastroesophageal reflux  disease)   . Hyperlipidemia   . Hypertension   . Neuralgia   . Pancreatitis   . Rectus diastasis   . Rhinitis    chronic    Surgical History: Past Surgical History:  Procedure Laterality Date  . arthroscopic shoulder Left   . CARDIAC CATHETERIZATION  2010  . CHOLECYSTECTOMY    . COLONOSCOPY WITH PROPOFOL N/A 07/01/2015   Procedure: COLONOSCOPY WITH PROPOFOL;  Surgeon: Manya Silvas, MD;  Location: [facility] ENDOSCOPY;  Service: Endoscopy;  Laterality: N/A;  . ESOPHAGOGASTRODUODENOSCOPY (EGD) WITH PROPOFOL N/A 07/01/2015   Procedure: ESOPHAGOGASTRODUODENOSCOPY (EGD) WITH PROPOFOL;  Surgeon: Manya Silvas, MD;  Location: [facility] ENDOSCOPY;  Service: Endoscopy;  Laterality: N/A;  . EUS  01/17/2012   Procedure: UPPER ENDOSCOPIC ULTRASOUND (EUS) LINEAR;  Surgeon: Milus Banister, MD;  Location: [facility] ENDOSCOPY;  Service: Endoscopy;  Laterality: N/A;  radial linear   . EXCISION MORTON'S NEUROMA     Left Foot  . HERNIA REPAIR     Right inguinal    Home Medications:  Allergies as of 06/05/2017   No Known Allergies     Medication List        Accurate as of 06/05/17  9:22 AM. Always use your most recent med list.          albuterol 108 (90 Base) MCG/ACT inhaler Commonly known as:  PROVENTIL HFA;VENTOLIN HFA Inhale into the lungs.   azelastine 0.1 % nasal spray Commonly known as:  ASTELIN Place into the nose.   cyclobenzaprine 10 MG tablet Commonly known as:  FLEXERIL   finasteride 5 MG tablet Commonly known as:  PROSCAR Take 1 tablet (5 mg total) by mouth daily. Reported on 09/06/2015   HYDROcodone-acetaminophen 5-325 MG tablet Commonly known as:  NORCO/VICODIN Take by mouth.   metaxalone 800 MG tablet Commonly known as:  SKELAXIN Reported on 09/06/2015   nabumetone 500 MG tablet Commonly known as:  RELAFEN Take 500 mg by mouth daily.   pantoprazole 40 MG tablet Commonly known as:  PROTONIX Take 40 mg by mouth 2 (two) times daily.   polyethylene glycol-electrolytes  420 g solution Commonly known as:  NuLYTELY/GoLYTELY Reported on 09/06/2015   ranitidine 300 MG tablet Commonly known as:  ZANTAC Take by  mouth.   sildenafil 100 MG tablet Commonly known as:  VIAGRA Take by mouth.   solifenacin 5 MG tablet Commonly known as:  VESICARE Take 1 tablet (5 mg total) by mouth daily.       Allergies: No Known Allergies  Family History: Family History  Problem Relation Age of Onset  . Prostate cancer Brother   . Kidney disease Neg Hx   . Kidney cancer Neg Hx   . Bladder Cancer Neg Hx     Social History:  reports that he quit smoking about 8 months ago. His smoking use included cigarettes. He has a 20.00 pack-year smoking history. he has never used smokeless tobacco. He reports that he does not drink alcohol or use drugs.  ROS: UROLOGY Frequent Urination?: No Hard to postpone urination?: No Burning/pain with urination?: No Get up at night to urinate?: No Leakage of urine?: No Urine stream starts and stops?: No Trouble starting stream?: No Do you have to strain to urinate?: No Blood in urine?: No Urinary tract infection?: No Sexually transmitted disease?: No Injury to kidneys or bladder?: No Painful intercourse?: No Weak stream?: No Erection problems?: Yes Penile pain?: No Gastrointestinal Nausea?: No Vomiting?: No Indigestion/heartburn?: No Diarrhea?: No Constipation?: No Constitutional Fever: No Night sweats?: No Weight loss?: No Fatigue?: No Skin Skin rash/lesions?: No Itching?: No Eyes Blurred vision?: No Double vision?: No Ears/Nose/Throat Sore throat?: No Sinus problems?: No Hematologic/Lymphatic Swollen glands?: No Easy bruising?: No Cardiovascular Leg swelling?: No Chest pain?: No Respiratory Cough?: No Shortness of breath?: No Endocrine Excessive thirst?: No Musculoskeletal Back pain?: No Joint pain?: No Neurological Headaches?: No Dizziness?: No Psychologic Depression?: No Anxiety?:  No  Physical Exam: BP (!) 143/84   Pulse 74   Ht 6' (1.829 m)   Wt 203 lb (92.1 kg)   BMI 27.53 kg/m   Constitutional: Well nourished. Alert and oriented, No acute distress. HEENT: [place] AT, moist mucus membranes. Trachea midline, no masses. Cardiovascular: No clubbing, cyanosis, or edema. Respiratory: Normal respiratory effort, no increased work of breathing. GI: Abdomen is soft, non tender, non distended, no abdominal masses. Liver and spleen not palpable.  No hernias appreciated.  Stool sample for occult testing is not indicated.   GU: No CVA tenderness.  No bladder fullness or masses.  Patient with circumcised phallus.   Urethral meatus is patent.  No penile discharge. No penile lesions or rashes. Scrotum without lesions, cysts, rashes and/or edema.  Testicles are located scrotally bilaterally. No masses are appreciated in the testicles. Left and right epididymis are normal. Rectal: Patient with  normal sphincter tone. Anus and perineum without scarring or rashes. No rectal masses are appreciated. Prostate is approximately 50 grams, no nodules are appreciated. Seminal vesicles are normal. Skin: No rashes, bruises or suspicious lesions. Lymph: No cervical or inguinal adenopathy. Neurologic: Grossly intact, no focal deficits, moving all 4 extremities. Psychiatric: Normal mood and affect.   Laboratory Data: PSA history  4.5 ng/mL on 09/06/2014  4.5 ng/mL on 03/01/2015  5.2 ng/mL on 09/02/2015 - started finasteride  2.6 ng/mL on 12/05/2015  1.9 ng/mL on 12/04/2016  2.5 ng/mL on 05/27/2017 Results for orders placed or performed in visit on 06/05/17  Bladder Scan (Post Void Residual) in office  Result Value Ref Range   Scan Result 13    TSH 2.843   On 05/08/2016  Testosterone 522 ng/dL on 06/06/2016  I have reviewed the labs  Pertinent Imaging Results for orders placed or performed in visit on 06/05/17  Bladder  Scan (Post Void Residual) in office  Result Value Ref Range    Scan Result 13     Assessment & Plan:   1. 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    2. Erectile dysfunction   - SHIM score was 5, it is stable  - He is wanting further treatment options for his ED  - failed PDE5-inhibitors  - quit smoking   - not a good candidate for penile prosthesis due to smoking and pulmonary issues  - interested in injections at this time - send in script for Edex   3.   BPH with LUTS  - IPSS score is 12/3, it is improving  - Continue conservative management, avoiding bladder irritants and timed voiding's  - most bothersome symptoms are urgency and nocturia  - Continue finasteride 5 mg daily  - RTC in 6 months for IPSS, PSA and exam   4. Urgency  - Vesicare not effective  - realizes it may be due to his caffeine intake  5. Nocturia  - completed sleep study in 05/2015 - no sleep apnea  - drinks a lot of liquids at night   Return in about 6 months (around 12/06/2017) for IPSS, SHIM, PSA and exam.  Zara Council, [facility]  [place] [address], [place], [place] [address] [phone]

## 2017-06-05 ENCOUNTER — Ambulatory Visit: Payer: 59 | Admitting: Urology

## 2017-06-05 ENCOUNTER — Encounter: Payer: Self-pay | Admitting: Urology

## 2017-06-05 VITALS — BP 143/84 | HR 74 | Ht 72.0 in | Wt 203.0 lb

## 2017-06-05 DIAGNOSIS — N138 Other obstructive and reflux uropathy: Secondary | ICD-10-CM

## 2017-06-05 DIAGNOSIS — N401 Enlarged prostate with lower urinary tract symptoms: Secondary | ICD-10-CM

## 2017-06-05 DIAGNOSIS — R3915 Urgency of urination: Secondary | ICD-10-CM

## 2017-06-05 DIAGNOSIS — R351 Nocturia: Secondary | ICD-10-CM

## 2017-06-05 DIAGNOSIS — N529 Male erectile dysfunction, unspecified: Secondary | ICD-10-CM

## 2017-06-05 DIAGNOSIS — Z87898 Personal history of other specified conditions: Secondary | ICD-10-CM

## 2017-06-05 LAB — BLADDER SCAN AMB NON-IMAGING: SCAN RESULT: 13

## 2017-06-05 MED ORDER — ALPROSTADIL (VASODILATOR) 10 MCG IC KIT: 10.0000 ug | PACK | INTRACAVERNOUS | 12 refills | Status: DC | PRN

## 2017-06-05 MED ORDER — FINASTERIDE 5 MG PO TABS: 5.0000 mg | ORAL_TABLET | Freq: Every day | ORAL | 3 refills | Status: DC

## 2017-06-11 ENCOUNTER — Other Ambulatory Visit: Payer: Self-pay | Admitting: Urology

## 2017-06-24 DIAGNOSIS — M19012 Primary osteoarthritis, left shoulder: Secondary | ICD-10-CM | POA: Diagnosis not present

## 2017-06-24 DIAGNOSIS — M7532 Calcific tendinitis of left shoulder: Secondary | ICD-10-CM | POA: Diagnosis not present

## 2017-07-17 ENCOUNTER — Encounter: Payer: Self-pay | Admitting: Urology

## 2017-07-17 ENCOUNTER — Ambulatory Visit: Payer: 59 | Admitting: Urology

## 2017-07-17 VITALS — BP 145/74 | HR 71 | Resp 16 | Ht 72.0 in | Wt 200.0 lb

## 2017-07-17 DIAGNOSIS — N5201 Erectile dysfunction due to arterial insufficiency: Secondary | ICD-10-CM | POA: Diagnosis not present

## 2017-07-18 ENCOUNTER — Encounter: Payer: Self-pay | Admitting: Urology

## 2017-07-18 NOTE — Progress Notes (Signed)
07/17/2017 7:49 AM   Joseph Larson [birth year] [id number]  Referring provider: Idelle Crouch, MD [facility] [place], [place] [address]  Chief Complaint  Patient presents with  . 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  PDE 5 inhibitors have not been effective.  He presents today with his wife for intracavernosal injection training.   PMH: Past Medical History:  Diagnosis Date  . Angina pectoris ([place])   . Anxiety   . Arthritis    osteoarthritis  . BPH (benign prostatic hyperplasia)   . Coronary atherosclerosis   . ED (erectile dysfunction)   . Elevated PSA   . Erectile dysfunction   . Esophageal spasm   . GERD (gastroesophageal reflux disease)   . Hyperlipidemia   . Hypertension   . Neuralgia   . Pancreatitis   . Rectus diastasis   . Rhinitis    chronic    Surgical History: Past Surgical History:  Procedure Laterality Date  . arthroscopic shoulder Left   . CARDIAC CATHETERIZATION  2010  . CHOLECYSTECTOMY    . COLONOSCOPY WITH PROPOFOL N/A 07/01/2015   Procedure: COLONOSCOPY WITH PROPOFOL;  Surgeon: Manya Silvas, MD;  Location: [facility] ENDOSCOPY;  Service: Endoscopy;  Laterality: N/A;  . ESOPHAGOGASTRODUODENOSCOPY (EGD) WITH PROPOFOL N/A 07/01/2015   Procedure: ESOPHAGOGASTRODUODENOSCOPY (EGD) WITH PROPOFOL;  Surgeon: Manya Silvas, MD;  Location: [facility] ENDOSCOPY;  Service: Endoscopy;  Laterality: N/A;  . EUS  01/17/2012   Procedure: UPPER ENDOSCOPIC ULTRASOUND (EUS) LINEAR;  Surgeon: Milus Banister, MD;  Location: [facility] ENDOSCOPY;  Service: Endoscopy;  Laterality: N/A;  radial linear   . EXCISION MORTON'S NEUROMA     Left Foot  . HERNIA REPAIR     Right inguinal    Home Medications:  Allergies as of 07/17/2017   No Known Allergies     Medication List        Accurate as of 07/17/17 11:59 PM. Always use your most recent med  list.          albuterol 108 (90 Base) MCG/ACT inhaler Commonly known as:  PROVENTIL HFA;VENTOLIN HFA Inhale into the lungs.   alprostadil 10 MCG injection Commonly known as:  EDEX 10 mcg by Intracavitary route as needed for erectile dysfunction. use no more than 3 times per week   azelastine 0.1 % nasal spray Commonly known as:  ASTELIN Place into the nose.   cyclobenzaprine 10 MG tablet Commonly known as:  FLEXERIL   finasteride 5 MG tablet Commonly known as:  PROSCAR Take 1 tablet (5 mg total) by mouth daily. Reported on 09/06/2015   finasteride 5 MG tablet Commonly known as:  PROSCAR TAKE 1 TABLET BY MOUTH DAILY   HYDROcodone-acetaminophen 5-325 MG tablet Commonly known as:  NORCO/VICODIN Take by mouth.   metaxalone 800 MG tablet Commonly known as:  SKELAXIN Reported on 09/06/2015   nabumetone 500 MG tablet Commonly known as:  RELAFEN Take 500 mg by mouth daily.   pantoprazole 40 MG tablet Commonly known as:  PROTONIX Take 40 mg by mouth 2 (two) times daily.   polyethylene glycol-electrolytes 420 g solution Commonly known as:  NuLYTELY/GoLYTELY Reported on 09/06/2015   ranitidine 300 MG tablet Commonly known as:  ZANTAC Take by mouth.   sildenafil 100 MG tablet Commonly known as:  VIAGRA Take by mouth.   solifenacin 5 MG tablet Commonly known as:  VESICARE Take 1  tablet (5 mg total) by mouth daily.       Allergies: No Known Allergies  Family History: Family History  Problem Relation Age of Onset  . Prostate cancer Brother   . Kidney disease Neg Hx   . Kidney cancer Neg Hx   . Bladder Cancer Neg Hx     Social History:  reports that he quit smoking about 9 months ago. His smoking use included cigarettes. He has a 20.00 pack-year smoking history. He has never used smokeless tobacco. He reports that he does not drink alcohol or use drugs.  ROS: UROLOGY Frequent Urination?: No Hard to postpone urination?: No Burning/pain with urination?:  No Get up at night to urinate?: Yes Leakage of urine?: No Urine stream starts and stops?: No Trouble starting stream?: No Do you have to strain to urinate?: No Blood in urine?: No Urinary tract infection?: No Sexually transmitted disease?: No Injury to kidneys or bladder?: No Painful intercourse?: No Weak stream?: No Erection problems?: Yes Penile pain?: No  Gastrointestinal Nausea?: No Vomiting?: No Indigestion/heartburn?: No Diarrhea?: No Constipation?: No  Constitutional Fever: No Night sweats?: No Weight loss?: No Fatigue?: No  Skin Skin rash/lesions?: No Itching?: No  Eyes Blurred vision?: No Double vision?: No  Ears/Nose/Throat Sore throat?: No Sinus problems?: No  Hematologic/Lymphatic Swollen glands?: No Easy bruising?: No  Cardiovascular Leg swelling?: No Chest pain?: No  Respiratory Cough?: No Shortness of breath?: No  Endocrine Excessive thirst?: No  Musculoskeletal Back pain?: No Joint pain?: No  Neurological Headaches?: No Dizziness?: No  Psychologic Depression?: No Anxiety?: No  Physical Exam: BP (!) 145/74   Pulse 71   Resp 16   Ht 6' (1.829 m)   Wt 200 lb (90.7 kg)   SpO2 98%   BMI 27.12 kg/m   Constitutional:  Alert and oriented, No acute distress.  Penile anatomy and landmarks were reviewed.  Injection sites and medication preparation were reviewed.  He was injected with 7.5 mcg of Edex in the right corpus cavernosum.  He will call back regarding duration and rigidity of erection.  Follow-up visit scheduled for additional training.  Assessment & Plan:    1. Erectile dysfunction due to arterial insufficiency    Abbie Sons, MD  [facility] [address] [place], [place] [address] [phone]

## 2017-09-12 DIAGNOSIS — J449 Chronic obstructive pulmonary disease, unspecified: Secondary | ICD-10-CM | POA: Diagnosis not present

## 2017-09-12 DIAGNOSIS — R05 Cough: Secondary | ICD-10-CM | POA: Diagnosis not present

## 2017-12-03 ENCOUNTER — Other Ambulatory Visit: Payer: Self-pay

## 2017-12-03 ENCOUNTER — Other Ambulatory Visit: Payer: 59

## 2017-12-03 DIAGNOSIS — Z87898 Personal history of other specified conditions: Secondary | ICD-10-CM | POA: Diagnosis not present

## 2017-12-04 LAB — PSA: Prostate Specific Ag, Serum: 2.1 ng/mL (ref 0.0–4.0)

## 2017-12-05 ENCOUNTER — Encounter: Payer: Self-pay | Admitting: Urology

## 2017-12-05 ENCOUNTER — Ambulatory Visit (INDEPENDENT_AMBULATORY_CARE_PROVIDER_SITE_OTHER): Payer: 59 | Admitting: Urology

## 2017-12-05 VITALS — BP 135/72 | HR 69 | Ht 72.0 in | Wt 196.1 lb

## 2017-12-05 DIAGNOSIS — N401 Enlarged prostate with lower urinary tract symptoms: Secondary | ICD-10-CM

## 2017-12-05 DIAGNOSIS — N138 Other obstructive and reflux uropathy: Secondary | ICD-10-CM

## 2017-12-05 DIAGNOSIS — R3915 Urgency of urination: Secondary | ICD-10-CM | POA: Diagnosis not present

## 2017-12-05 DIAGNOSIS — Z87898 Personal history of other specified conditions: Secondary | ICD-10-CM

## 2017-12-05 DIAGNOSIS — N5201 Erectile dysfunction due to arterial insufficiency: Secondary | ICD-10-CM | POA: Diagnosis not present

## 2017-12-05 NOTE — Progress Notes (Signed)
9:41 AM   Joseph Larson 1955/11/10 [id number]  Referring provider: Idelle Crouch, MD [place] [facility] [place], [place] [address]  Chief Complaint  Patient presents with  . Elevated PSA    HPI: Patient is a 62 year old Caucasian male who has a history of elevated PSA (with HGPIN), erectile dysfunction and BPH with LUTS who presents today for a 6 month follow up.    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  We have been following his PSA's every 6 months.  His current PSA is 2.1 ng/mL on 11/2017.    Erectile dysfunction His SHIM score is 17, which is mild ED.  His previous SHIM score was 5.  His risk factors for ED are age, BPH, smoking (former), and pain medication.  He denies any painful erections or curvatures with his erections.   He is no longer having spontaneous  erections.  He has tried Viagra in the past and found it ineffective.  He is now using EDEX with success.  He is now having right inguinal pain associated with erections.  He cannot be sure if this started before or after the initiation of EDEX.   SHIM    Row Name 12/05/17 0902         SHIM: Over the last 6 months:   How do you rate your confidence that you could get and keep an erection?  Low     When you had erections with sexual stimulation, how often were your erections hard enough for penetration (entering your partner)?  Sometimes (about half the time)     During sexual intercourse, how often were you able to maintain your erection after you had penetrated (entered) your partner?  Most Times (much more than half the time)     During sexual intercourse, how difficult was it to maintain your erection to completion of intercourse?  Slightly Difficult     When you attempted sexual intercourse, how often was it satisfactory for you?  Most Times (much more than half the  time)       SHIM Total Score   SHIM  17        Score: 1-7 Severe ED 8-11 Moderate ED 12-16 Mild-Moderate ED 17-21 Mild ED 22-25 No ED  BPH WITH LU TS. His IPSS score today is 9, which is moderate lower urinary tract symptomatology. He is mostly dissatisfied with his quality life due to his urinary symptoms.   His previous IPSS score was 12/3.  His major complaints today is urgency These are baseline.  He denies any dysuria, hematuria or suprapubic pain.   He also denies any recent fevers, chills, nausea or vomiting.    He is taking the finasteride.  He is not taking the Vesicare.  IPSS    Row Name 12/05/17 0800         International Prostate Symptom Score   How often have you had the sensation of not emptying your bladder?  Less than 1 in 5     How often have you had to urinate less than every two hours?  About half the time     How often have you found you stopped and started again several times when you urinated?  Less than 1 in 5 times     How often have you found  it difficult to postpone urination?  Less than 1 in 5 times     How often have you had a weak urinary stream?  Less than half the time     How often have you had to strain to start urination?  Not at All     How many times did you typically get up at night to urinate?  1 Time     Total IPSS Score  9       Quality of Life due to urinary symptoms   If you were to spend the rest of your life with your urinary condition just the way it is now how would you feel about that?  Mostly Disatisfied        Score:  1-7 Mild 8-19 Moderate 20-35 Severe   PMH: Past Medical History:  Diagnosis Date  . Angina pectoris ([place])   . Anxiety   . Arthritis    osteoarthritis  . BPH (benign prostatic hyperplasia)   . Coronary atherosclerosis   . ED (erectile dysfunction)   . Elevated PSA   . Erectile dysfunction   . Esophageal spasm   . GERD (gastroesophageal reflux disease)   . Hyperlipidemia   . Hypertension   .  Neuralgia   . Pancreatitis   . Rectus diastasis   . Rhinitis    chronic    Surgical History: Past Surgical History:  Procedure Laterality Date  . arthroscopic shoulder Left   . CARDIAC CATHETERIZATION  2010  . CHOLECYSTECTOMY    . COLONOSCOPY WITH PROPOFOL N/A 07/01/2015   Procedure: COLONOSCOPY WITH PROPOFOL;  Surgeon: Manya Silvas, MD;  Location[facility] ENDOSCOPY;  Service: Endoscopy;  Laterality: N/A;  . ESOPHAGOGASTRODUODENOSCOPY (EGD) WITH PROPOFOL N/A 07/01/2015   Procedure: ESOPHAGOGASTRODUODENOSCOPY (EGD) WITH PROPOFOL;  Surgeon: Manya Silvas, MD;  Location: [facility] ENDOSCOPY;  Service: Endoscopy;  Laterality: N/A;  . EUS  01/17/2012   Procedure: UPPER ENDOSCOPIC ULTRASOUND (EUS) LINEAR;  Surgeon: Milus Banister, MD;  Location: [facility] ENDOSCOPY;  Service: Endoscopy;  Laterality: N/A;  radial linear   . EXCISION MORTON'S NEUROMA     Left Foot  . HERNIA REPAIR     Right inguinal    Home Medications:  Allergies as of 12/05/2017   No Known Allergies     Medication List        Accurate as of 12/05/17  9:41 AM. Always use your most recent med list.          albuterol 108 (90 Base) MCG/ACT inhaler Commonly known as:  PROVENTIL HFA;VENTOLIN HFA Inhale into the lungs.   alprostadil 10 MCG injection Commonly known as:  EDEX 10 mcg by Intracavitary route as needed for erectile dysfunction. use no more than 3 times per week   azelastine 0.1 % nasal spray Commonly known as:  ASTELIN Place into the nose.   cyclobenzaprine 10 MG tablet Commonly known as:  FLEXERIL   finasteride 5 MG tablet Commonly known as:  PROSCAR Take 1 tablet (5 mg total) by mouth daily. Reported on 09/06/2015   nabumetone 500 MG tablet Commonly known as:  RELAFEN Take 500 mg by mouth daily.   pantoprazole 40 MG tablet Commonly known as:  PROTONIX Take 40 mg by mouth 2 (two) times daily.   ranitidine 300 MG tablet Commonly known as:  ZANTAC Take by mouth.   solifenacin 5 MG  tablet Commonly known as:  VESICARE Take 1 tablet (5 mg total) by mouth daily.       Allergies:  No Known Allergies  Family History: Family History  Problem Relation Age of Onset  . Prostate cancer Brother   . Kidney disease Neg Hx   . Kidney cancer Neg Hx   . Bladder Cancer Neg Hx     Social History:  reports that he quit smoking about 14 months ago. His smoking use included cigarettes. He has a 20.00 pack-year smoking history. He has never used smokeless tobacco. He reports that he does not drink alcohol or use drugs.  ROS: UROLOGY Frequent Urination?: No Hard to postpone urination?: Yes Burning/pain with urination?: No Get up at night to urinate?: No Leakage of urine?: No Urine stream starts and stops?: No Trouble starting stream?: No Do you have to strain to urinate?: No Blood in urine?: No Urinary tract infection?: No Sexually transmitted disease?: No Injury to kidneys or bladder?: No Painful intercourse?: No Weak stream?: No Erection problems?: Yes Penile pain?: No Gastrointestinal Nausea?: No Vomiting?: No Indigestion/heartburn?: No Diarrhea?: No Constipation?: No Constitutional Fever: No Night sweats?: No Weight loss?: No Fatigue?: No Skin Skin rash/lesions?: No Itching?: No Eyes Blurred vision?: No Double vision?: No Ears/Nose/Throat Sore throat?: No Sinus problems?: No Hematologic/Lymphatic Swollen glands?: No Easy bruising?: No Cardiovascular Leg swelling?: No Chest pain?: No Respiratory Cough?: No Shortness of breath?: No Endocrine Excessive thirst?: No Musculoskeletal Back pain?: No Joint pain?: No Neurological Headaches?: No Dizziness?: No Psychologic Depression?: No Anxiety?: No  Physical Exam: BP 135/72 (BP Location: Left Arm, Patient Position: Sitting, Cuff Size: Normal)   Pulse 69   Ht 6' (1.829 m)   Wt 196 lb 1.6 oz (89 kg)   BMI 26.60 kg/m   Constitutional: Well nourished. Alert and oriented, No acute  distress. HEENT: [place] AT, moist mucus membranes. Trachea midline, no masses. Cardiovascular: No clubbing, cyanosis, or edema. Respiratory: Normal respiratory effort, no increased work of breathing. GI: Abdomen is soft, non tender, non distended, no abdominal masses. Liver and spleen not palpable.  No hernias appreciated.  Stool sample for occult testing is not indicated.   GU: No CVA tenderness.  No bladder fullness or masses.  Patient with circumcised phallus.  Urethral meatus is patent.  No penile discharge. No penile lesions or rashes. Scrotum without lesions, cysts, rashes and/or edema.  Testicles are located scrotally bilaterally. No masses are appreciated in the testicles. Left and right epididymis are normal. Rectal: Patient with  normal sphincter tone. Anus and perineum without scarring or rashes. No rectal masses are appreciated. Prostate is approximately 55 grams, no nodules are appreciated. Seminal vesicles are normal. Skin: No rashes, bruises or suspicious lesions. Lymph: No cervical or inguinal adenopathy. Neurologic: Grossly intact, no focal deficits, moving all 4 extremities. Psychiatric: Normal mood and affect.   Laboratory Data: PSA history  4.5 ng/mL on 09/06/2014  4.5 ng/mL on 03/01/2015  5.2 ng/mL on 09/02/2015 - started finasteride  2.6 ng/mL on 12/05/2015  1.9 ng/mL on 12/04/2016  2.5 ng/mL on 05/27/2017 Results for orders placed or performed in visit on 12/03/17  PSA  Result Value Ref Range   Prostate Specific Ag, Serum 2.1 0.0 - 4.0 ng/mL   TSH 2.843   On 05/08/2016  Testosterone 522 ng/dL on 06/06/2016  I have reviewed the labs  Pertinent Imaging Results for orders placed or performed in visit on 06/05/17  Bladder Scan (Post Void Residual) in office  Result Value Ref Range   Scan Result 13     Assessment & Plan:   1. History of elevated PSA Underwent a biopsy  on 08/28/2013 for a PSA of 5.26 ng/mL and was found to have HGPIN in 2 cores Current PSA is  2.1 on 11/2017 Currently taking finasteride RTC in 6 months for a PSA and exam.    2. Erectile dysfunction  Using EDEX successfully   3.   BPH with LUTS IPSS score is 9/4, it is improving Continue conservative management, avoiding bladder irritants and timed voiding's Most bothersome symptoms are urgency and nocturia Continue finasteride 5 mg daily RTC in 6 months for IPSS, PSA and exam   4. Urgency Vesicare not effective  5. Nocturia Completed sleep study in 05/2015 - no sleep apnea Drinks a lot of liquids at night  6. Right lower quadrant pain Currently only occurs with erections Patient states that he would like to try the Edex again to see if the pain reoccurs and will call us back with more details regarding the pain   Return in about 6 months (around 06/05/2018) for IPSS, SHIM, PSA and exam.  Zara Council, [facility]  [place] [place], [place] [address] [phone]

## 2018-02-12 DIAGNOSIS — J310 Chronic rhinitis: Secondary | ICD-10-CM | POA: Diagnosis not present

## 2018-02-12 DIAGNOSIS — J439 Emphysema, unspecified: Secondary | ICD-10-CM | POA: Diagnosis not present

## 2018-02-12 DIAGNOSIS — G4700 Insomnia, unspecified: Secondary | ICD-10-CM | POA: Diagnosis not present

## 2018-05-23 DIAGNOSIS — M48061 Spinal stenosis, lumbar region without neurogenic claudication: Secondary | ICD-10-CM | POA: Diagnosis not present

## 2018-05-23 DIAGNOSIS — M5416 Radiculopathy, lumbar region: Secondary | ICD-10-CM | POA: Diagnosis not present

## 2018-05-23 DIAGNOSIS — M5126 Other intervertebral disc displacement, lumbar region: Secondary | ICD-10-CM | POA: Diagnosis not present

## 2018-05-23 DIAGNOSIS — G8929 Other chronic pain: Secondary | ICD-10-CM | POA: Diagnosis not present

## 2018-05-30 ENCOUNTER — Other Ambulatory Visit: Payer: Self-pay

## 2018-05-30 DIAGNOSIS — Z87898 Personal history of other specified conditions: Secondary | ICD-10-CM

## 2018-06-01 ENCOUNTER — Other Ambulatory Visit: Payer: Self-pay | Admitting: Urology

## 2018-06-02 ENCOUNTER — Other Ambulatory Visit: Payer: 59

## 2018-06-02 DIAGNOSIS — Z87898 Personal history of other specified conditions: Secondary | ICD-10-CM

## 2018-06-03 LAB — PSA: Prostate Specific Ag, Serum: 1.3 ng/mL (ref 0.0–4.0)

## 2018-06-03 NOTE — Progress Notes (Signed)
06/05/2018 10:54 AM   [name] Nov 13, 1955 [id number]  Referring provider: Idelle Crouch, MD [facility] [place], [place] [address]  Chief Complaint  Patient presents with  . Elevated PSA    HPI: Joseph Larson is a 63 y.o. male Caucasian who has a history of elevated PSA (with HGPIN), erectile dysfunction and BPH with LUTS who presents today for a 6 month follow up.  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  We have been following his PSA's every 6 months.  His current PSA is 1.3 ng/mL on 05/2018  Erectile dysfunction His SHIM score is 15, which is mild to moderate ED.   His previous SHIM score was 17.   His risk factors for ED are age, BPH, smoking (former), and pain medication.   He denies any painful erections or curvatures with his erections.   He is no longer having spontaneous erections.  He has tried Viagra in the past and found it ineffective, but has had success with EDEX.   He cannot inject himself with the EDEX due to his cervical neck surgeries and his wife is uncomfortable with the injection.   SHIM    Row Name 06/05/18 1014         SHIM: Over the last 6 months:   How do you rate your confidence that you could get and keep an erection?  Very Low     When you had erections with sexual stimulation, how often were your erections hard enough for penetration (entering your partner)?  A Few Times (much less than half the time)     During sexual intercourse, how often were you able to maintain your erection after you had penetrated (entered) your partner?  Most Times (much more than half the time)     During sexual intercourse, how difficult was it to maintain your erection to completion of intercourse?  Slightly Difficult     When you attempted sexual intercourse, how often was it satisfactory for you?  Most Times (much  more than half the time)       SHIM Total Score   SHIM  15        Score: 1-7 Severe ED 8-11 Moderate ED 12-16 Mild-Moderate ED 17-21 Mild ED 22-25 No ED   BPH WITH LUTS  (prostate and/or bladder) IPSS score: 12/4   Previous score: 9/4  Major complaint(s):  Still experiencing strong urgency for several months.  Denies any dysuria, hematuria or suprapubic pain.   Currently taking: Finasteride.   Not taking Vesicare due to finding it ineffective.  Denies any recent fevers, chills, nausea or vomiting.  He has a family history of PCa, with his brother having been diagnosed.  He is drinking a lot of coffee daily.    IPSS    Row Name 06/05/18 1000         International Prostate Symptom Score   How often have you had the sensation of not emptying your bladder?  Less than 1 in 5     How often have you had to urinate less than every two hours?  About half the time     How often have you found you stopped and started again several times when you urinated?  Less than 1 in 5 times     How often have you  found it difficult to postpone urination?  More than half the time     How often have you had a weak urinary stream?  Less than 1 in 5 times     How often have you had to strain to start urination?  Not at All     How many times did you typically get up at night to urinate?  2 Times     Total IPSS Score  12       Quality of Life due to urinary symptoms   If you were to spend the rest of your life with your urinary condition just the way it is now how would you feel about that?  Mostly Disatisfied        Score:  1-7 Mild 8-19 Moderate 20-35 Severe    He did complain of right sided pain that has been occurring on and off for several months.  He states at night it bothers him the most.  He describes the pain as throbbing.  He has not had flank pain or gross hematuria.    PMH: Past Medical History:  Diagnosis Date  . Angina pectoris ([place])   . Anxiety   . Arthritis     osteoarthritis  . BPH (benign prostatic hyperplasia)   . Coronary atherosclerosis   . ED (erectile dysfunction)   . Elevated PSA   . Erectile dysfunction   . Esophageal spasm   . GERD (gastroesophageal reflux disease)   . Hyperlipidemia   . Hypertension   . Neuralgia   . Pancreatitis   . Rectus diastasis   . Rhinitis    chronic    Surgical History: Past Surgical History:  Procedure Laterality Date  . arthroscopic shoulder Left   . CARDIAC CATHETERIZATION  2010  . CHOLECYSTECTOMY    . COLONOSCOPY WITH PROPOFOL N/A 07/01/2015   Procedure: COLONOSCOPY WITH PROPOFOL;  Surgeon: Manya Silvas, MD;  Location: [facility] ENDOSCOPY;  Service: Endoscopy;  Laterality: N/A;  . ESOPHAGOGASTRODUODENOSCOPY (EGD) WITH PROPOFOL N/A 07/01/2015   Procedure: ESOPHAGOGASTRODUODENOSCOPY (EGD) WITH PROPOFOL;  Surgeon: Manya Silvas, MD;  Location: [facility] ENDOSCOPY;  Service: Endoscopy;  Laterality: N/A;  . EUS  01/17/2012   Procedure: UPPER ENDOSCOPIC ULTRASOUND (EUS) LINEAR;  Surgeon: Milus Banister, MD;  Location: [facility] ENDOSCOPY;  Service: Endoscopy;  Laterality: N/A;  radial linear   . EXCISION MORTON'S NEUROMA     Left Foot  . HERNIA REPAIR     Right inguinal    Home Medications:  Allergies as of 06/05/2018   No Known Allergies     Medication List       Accurate as of June 05, 2018 10:54 AM. Always use your most recent med list.        albuterol 108 (90 Base) MCG/ACT inhaler Commonly known as:  PROVENTIL HFA;VENTOLIN HFA Inhale into the lungs.   alprostadil 10 MCG injection Commonly known as:  Edex 10 mcg by Intracavitary route as needed for erectile dysfunction. use no more than 3 times per week   azelastine 0.1 % nasal spray Commonly known as:  ASTELIN Place into the nose.   cyclobenzaprine 10 MG tablet Commonly known as:  FLEXERIL   finasteride 5 MG tablet Commonly known as:  PROSCAR TAKE 1 TABLET BY MOUTH  DAILY.   nabumetone 500 MG tablet Commonly known as:   RELAFEN Take 500 mg by mouth daily.   pantoprazole 40 MG tablet Commonly known as:  PROTONIX Take 40 mg by mouth 2 (two) times  daily.   ranitidine 300 MG tablet Commonly known as:  ZANTAC Take by mouth.   solifenacin 5 MG tablet Commonly known as:  VESIcare Take 1 tablet (5 mg total) by mouth daily.       Allergies: No Known Allergies  Family History: Family History  Problem Relation Age of Onset  . Prostate cancer Brother   . Kidney disease Neg Hx   . Kidney cancer Neg Hx   . Bladder Cancer Neg Hx     Social History:  reports that he quit smoking about 20 months ago. His smoking use included cigarettes. He has a 20.00 pack-year smoking history. He has never used smokeless tobacco. He reports that he does not drink alcohol or use drugs.  ROS: UROLOGY Frequent Urination?: No Hard to postpone urination?: Yes Burning/pain with urination?: No Get up at night to urinate?: Yes Leakage of urine?: No Urine stream starts and stops?: No Trouble starting stream?: No Do you have to strain to urinate?: No Blood in urine?: No Urinary tract infection?: No Sexually transmitted disease?: No Injury to kidneys or bladder?: No Painful intercourse?: No Weak stream?: No Erection problems?: Yes Penile pain?: No Gastrointestinal Nausea?: No Vomiting?: No Indigestion/heartburn?: No Diarrhea?: No Constipation?: No Constitutional Fever: No Night sweats?: No Weight loss?: No Fatigue?: No Skin Skin rash/lesions?: No Itching?: No Eyes Blurred vision?: No Double vision?: No Ears/Nose/Throat Sore throat?: No Sinus problems?: No Hematologic/Lymphatic Swollen glands?: No Easy bruising?: No Cardiovascular Leg swelling?: No Chest pain?: No Respiratory Cough?: No Shortness of breath?: No Endocrine Excessive thirst?: No Musculoskeletal Back pain?: No Joint pain?: No Neurological Headaches?: No Dizziness?: No Psychologic Depression?: No Anxiety?: No  Physical  Exam: BP (!) 150/70 (BP Location: Left Arm, Patient Position: Sitting, Cuff Size: Normal)   Pulse 71   Ht 6' (1.829 m)   Wt 189 lb (85.7 kg)   BMI 25.63 kg/m   Constitutional:  Well nourished. Alert and oriented, No acute distress. HEENT: [place] AT, moist mucus membranes.  Trachea midline. Cardiovascular: No clubbing, cyanosis, or edema. Respiratory: Normal respiratory effort, no increased work of breathing. GI: Abdomen is soft, non tender, non distended, no abdominal masses. Liver and spleen not palpable.  No hernias appreciated.  Stool sample for occult testing is not indicated. GU: No CVA tenderness.  No bladder fullness or masses.  Patient with circumcised phallus.  Urethral meatus is patent.  No penile discharge. No penile lesions or rashes. Scrotum without lesions, cysts, rashes and/or edema.  Testicles are located scrotally bilaterally. No masses are appreciated in the testicles. Left and right epididymis are normal. Rectal: Patient with normal sphincter tone.  Anus and perineum without scarring or rashes.  No rectal masses are appreciated. Prostate is approximately 55 grams, no nodules are appreciated.  Seminal vesicles are normal. Skin: No rashes, bruises or suspicious lesions. Lymph: No inguinal adenopathy. Neurologic: Grossly intact, no focal deficits, moving all 4 extremities. Psychiatric: Normal mood and affect.  Laboratory Data:  PSA history  4.5 ng/mL on 09/06/2014  4.5 ng/mL on 03/01/2015  5.2 ng/mL on 09/02/2015 - started finasteride  2.6 (5.2) ng/mL on 12/05/2015  1.9 (3.8) ng/mL on 12/04/2016  2.5 (5.0) ng/mL on 05/27/2017  2.1 (4.2) ng/mL on 12/03/2017  1.3 (2.6) ng/mL on 06/02/2018  TSH 2.843   On 05/08/2016  Testosterone 522 ng/dL on 06/06/2016  I have reviewed the labs  Pertinent Imaging Results for orders placed or performed in visit on 06/05/17  Bladder Scan (Post Void Residual) in office  Result  Value Ref Range   Scan Result 13     Assessment & Plan:    1. 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    2. Erectile dysfunction  - May want to discuss other options in the future  3.   BPH with LUTS - IPSS score is 12/4, it is worsening - Continue conservative management, avoiding bladder irritants and timed voiding's - Most bothersome symptoms are urgency  - Continue finasteride 5 mg daily - Patient will start Myrbetriq 25 mg daily, I have advised the patient of the side effects of Myrbetriq, such as: elevation in BP, urinary retention and/or HA. - RTC in 6 months for IPSS, PSA and exam   4. Urgency - Vesicare not effective - start Myrbetriq - RTC in 3 weeks for I PSS and PVR   5. Nocturia - Completed sleep study in 05/2015 - no sleep apnea  6. Right side pain - Epic system is down - cannot get radiological studies, has upcoming appointment with PCP and will speak to him regarding his pain at that time   Return in about 3 weeks (around 06/26/2018) for IPSS and PVR.  Zara Council, PA-C  [facility] [address] [place], [place] [address] [phone]  I, Adele Schilder, am acting as a Education administrator for [employer], PA-C.   I have reviewed the above documentation for accuracy and completeness, and I agree with the above.    Zara Council, PA-C

## 2018-06-05 ENCOUNTER — Ambulatory Visit (INDEPENDENT_AMBULATORY_CARE_PROVIDER_SITE_OTHER): Payer: 59 | Admitting: Urology

## 2018-06-05 ENCOUNTER — Other Ambulatory Visit: Payer: Self-pay

## 2018-06-05 ENCOUNTER — Encounter: Payer: Self-pay | Admitting: Urology

## 2018-06-05 VITALS — BP 150/70 | HR 71 | Ht 72.0 in | Wt 189.0 lb

## 2018-06-05 DIAGNOSIS — N5201 Erectile dysfunction due to arterial insufficiency: Secondary | ICD-10-CM

## 2018-06-05 DIAGNOSIS — Z87898 Personal history of other specified conditions: Secondary | ICD-10-CM | POA: Diagnosis not present

## 2018-06-05 DIAGNOSIS — N401 Enlarged prostate with lower urinary tract symptoms: Secondary | ICD-10-CM | POA: Diagnosis not present

## 2018-06-05 DIAGNOSIS — N138 Other obstructive and reflux uropathy: Secondary | ICD-10-CM

## 2018-06-05 DIAGNOSIS — R109 Unspecified abdominal pain: Secondary | ICD-10-CM

## 2018-06-05 DIAGNOSIS — R3915 Urgency of urination: Secondary | ICD-10-CM

## 2018-06-05 MED ORDER — MIRABEGRON ER 25 MG PO TB24: 25.0000 mg | ORAL_TABLET | Freq: Every day | ORAL | 0 refills | Status: DC

## 2018-06-05 NOTE — Addendum Note (Signed)
Addended by: Zara Council A on: 06/05/2018 10:55 AM   Modules accepted: Orders

## 2018-06-20 NOTE — Progress Notes (Signed)
06/23/2018 10:01 AM   Joseph Larson 12-Mar-1956 [id number]  Referring provider: Idelle Crouch, MD [facility] [place], [place] [address]  Chief Complaint  Patient presents with  . Elevated PSA    HPI: Joseph Larson is a 63 y.o. male Caucasian who has a history of elevated PSA (with HGPIN), erectile dysfunction and BPH/LUTS with urgency and nocturia who presents today for a 3 week follow up with I PSS and PVR after a trial of Myrbetriq 25 mg daily.    BPH WITH LUTS  (prostate and/or bladder) IPSS score: 10/4   PVR: 0 mL    Previous score: 12/4  Previous PVR:  13 mL    Major complaint(s):  Frequency, urgency and nocturia.  Feels like the Myrbetriq started "kicking in the last few days"   Being able to hold it got better.  He does not want to continue the Myrbetriq as it has caused intolerable constipation.  Denies any dysuria, hematuria or suprapubic pain.   He is still experiencing the right sided pain and night.  He describes the pain as the feeling of having a very full bladder and voiding relieves the pain.    He was not able to see his PCP for imaging studies.    Currently taking: Finasteride.   Not taking Vesicare due to finding it ineffective.  Denies any recent fevers, chills, nausea or vomiting.  He has a family history of PCa, with his brother having been diagnosed.  He is drinking a lot of coffee daily.    IPSS    Row Name 06/23/18 0900         International Prostate Symptom Score   How often have you had the sensation of not emptying your bladder?  Less than 1 in 5     How often have you had to urinate less than every two hours?  About half the time     How often have you found you stopped and started again several times when you urinated?  Less than 1 in 5 times     How often have you found it difficult to postpone urination?  About half the time     How often have you had a weak urinary stream?  Not at All     How often have you  had to strain to start urination?  Not at All     How many times did you typically get up at night to urinate?  2 Times     Total IPSS Score  10       Quality of Life due to urinary symptoms   If you were to spend the rest of your life with your urinary condition just the way it is now how would you feel about that?  Mostly Disatisfied        Score:  1-7 Mild 8-19 Moderate 20-35 Severe  PMH: Past Medical History:  Diagnosis Date  . Angina pectoris ([place])   . Anxiety   . Arthritis    osteoarthritis  . BPH (benign prostatic hyperplasia)   . Coronary atherosclerosis   . ED (erectile dysfunction)   . Elevated PSA   . Erectile dysfunction   . Esophageal spasm   . GERD (gastroesophageal reflux disease)   . Hyperlipidemia   . Hypertension   . Neuralgia   . Pancreatitis   . Rectus diastasis   . Rhinitis    chronic    Surgical History: Past Surgical  History:  Procedure Laterality Date  . arthroscopic shoulder Left   . CARDIAC CATHETERIZATION  2010  . CHOLECYSTECTOMY    . COLONOSCOPY WITH PROPOFOL N/A 07/01/2015   Procedure: COLONOSCOPY WITH PROPOFOL;  Surgeon: Manya Silvas, MD;  Location: [facility] ENDOSCOPY;  Service: Endoscopy;  Laterality: N/A;  . ESOPHAGOGASTRODUODENOSCOPY (EGD) WITH PROPOFOL N/A 07/01/2015   Procedure: ESOPHAGOGASTRODUODENOSCOPY (EGD) WITH PROPOFOL;  Surgeon: Manya Silvas, MD;  Location: [facility] ENDOSCOPY;  Service: Endoscopy;  Laterality: N/A;  . EUS  01/17/2012   Procedure: UPPER ENDOSCOPIC ULTRASOUND (EUS) LINEAR;  Surgeon: Milus Banister, MD;  Location: [facility] ENDOSCOPY;  Service: Endoscopy;  Laterality: N/A;  radial linear   . EXCISION MORTON'S NEUROMA     Left Foot  . HERNIA REPAIR     Right inguinal    Home Medications:  Allergies as of 06/23/2018   No Known Allergies     Medication List       Accurate as of June 23, 2018 10:01 AM. Always use your most recent med list.        albuterol 108 (90 Base) MCG/ACT inhaler Commonly known as:   PROVENTIL HFA;VENTOLIN HFA Inhale into the lungs.   alprostadil 10 MCG injection Commonly known as:  Edex 10 mcg by Intracavitary route as needed for erectile dysfunction. use no more than 3 times per week   azelastine 0.1 % nasal spray Commonly known as:  ASTELIN Place into the nose.   cyclobenzaprine 10 MG tablet Commonly known as:  FLEXERIL   finasteride 5 MG tablet Commonly known as:  PROSCAR TAKE 1 TABLET BY MOUTH  DAILY.   mirabegron ER 25 MG Tb24 tablet Commonly known as:  MYRBETRIQ Take 1 tablet (25 mg total) by mouth daily.   nabumetone 500 MG tablet Commonly known as:  RELAFEN Take 500 mg by mouth daily.   pantoprazole 40 MG tablet Commonly known as:  PROTONIX Take 40 mg by mouth 2 (two) times daily.   ranitidine 300 MG tablet Commonly known as:  ZANTAC Take by mouth.   solifenacin 5 MG tablet Commonly known as:  VESIcare Take 1 tablet (5 mg total) by mouth daily.       Allergies: No Known Allergies  Family History: Family History  Problem Relation Age of Onset  . Prostate cancer Brother   . Kidney disease Neg Hx   . Kidney cancer Neg Hx   . Bladder Cancer Neg Hx     Social History:  reports that he quit smoking about 20 months ago. His smoking use included cigarettes. He has a 20.00 pack-year smoking history. He has never used smokeless tobacco. He reports that he does not drink alcohol or use drugs.  ROS: UROLOGY Frequent Urination?: Yes Hard to postpone urination?: Yes Burning/pain with urination?: No Get up at night to urinate?: Yes Leakage of urine?: No Urine stream starts and stops?: No Trouble starting stream?: No Do you have to strain to urinate?: No Blood in urine?: No Urinary tract infection?: No Sexually transmitted disease?: No Injury to kidneys or bladder?: No Painful intercourse?: No Weak stream?: No Erection problems?: No Penile pain?: No Gastrointestinal Nausea?: No Vomiting?: No Indigestion/heartburn?: No  Diarrhea?: No Constipation?: No Constitutional Fever: No Night sweats?: No Weight loss?: No Fatigue?: No Skin Skin rash/lesions?: No Itching?: No Eyes Blurred vision?: No Double vision?: No Ears/Nose/Throat Sore throat?: No Sinus problems?: No Hematologic/Lymphatic Swollen glands?: No Easy bruising?: No Cardiovascular Leg swelling?: No Chest pain?: No Respiratory Cough?: No Shortness of breath?: No  Endocrine Excessive thirst?: No Musculoskeletal Back pain?: No Joint pain?: No Neurological Headaches?: No Dizziness?: No Psychologic Depression?: No Anxiety?: No  Physical Exam: BP (!) 148/81 (BP Location: Left Arm, Patient Position: Sitting)   Pulse 66   Ht 6' (1.829 m)   Wt 190 lb (86.2 kg) Comment: per pt  BMI 25.77 kg/m   Constitutional:  Well nourished. Alert and oriented, No acute distress. HEENT: [place] AT, moist mucus membranes.  Trachea midline, no masses. Cardiovascular: No clubbing, cyanosis, or edema. Respiratory: Normal respiratory effort, no increased work of breathing. Neurologic: Grossly intact, no focal deficits, moving all 4 extremities. Psychiatric: Normal mood and affect.  Laboratory Data:  PSA history  4.5 ng/mL on 09/06/2014  4.5 ng/mL on 03/01/2015  5.2 ng/mL on 09/02/2015 - started finasteride  2.6 (5.2) ng/mL on 12/05/2015  1.9 (3.8) ng/mL on 12/04/2016  2.5 (5.0) ng/mL on 05/27/2017  2.1 (4.2) ng/mL on 12/03/2017  1.3 (2.6) ng/mL on 06/02/2018  TSH 2.843   On 05/08/2016  Testosterone 522 ng/dL on 06/06/2016  I have reviewed the labs  Pertinent Imaging Results for HARKIRAT, OROZCO (MRN [id number]) as of 06/23/2018 09:47  Ref. Range 06/23/2018 09:42  Scan Result Unknown 104ml    Assessment & Plan:   1.   BPH with LUTS - IPSS score is 10/4, it is somewhat improved with Myrbetriq - but side effects are intolerable  - Continue conservative management, avoiding bladder irritants and timed voiding's - Most bothersome symptoms are  urgency  - Continue finasteride 5 mg daily - RTC in 6 months for IPSS, PSA and exam   2. 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 Nocturia - Completed sleep study in 05/2015 - no sleep apnea - will take an urinal home and record nightly voided volumes for two weeks   4. 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    5. Erectile dysfunction  - May want to discuss other options in the future  6. Right side pain - measuring nightly voided volumes to see if this is the cause of the pain   Return for patient to call in three months to schedule a cysto if needed .  Zara Council, PA-C  [facility] [address] [place], [place] [address] [phone]

## 2018-06-23 ENCOUNTER — Ambulatory Visit: Payer: 59 | Admitting: Urology

## 2018-06-23 ENCOUNTER — Encounter: Payer: Self-pay | Admitting: Urology

## 2018-06-23 ENCOUNTER — Other Ambulatory Visit: Payer: Self-pay

## 2018-06-23 VITALS — BP 148/81 | HR 66 | Ht 72.0 in | Wt 190.0 lb

## 2018-06-23 DIAGNOSIS — R3915 Urgency of urination: Secondary | ICD-10-CM

## 2018-06-23 DIAGNOSIS — N138 Other obstructive and reflux uropathy: Secondary | ICD-10-CM

## 2018-06-23 DIAGNOSIS — N401 Enlarged prostate with lower urinary tract symptoms: Secondary | ICD-10-CM

## 2018-06-23 DIAGNOSIS — R351 Nocturia: Secondary | ICD-10-CM | POA: Diagnosis not present

## 2018-06-23 LAB — BLADDER SCAN AMB NON-IMAGING

## 2018-06-26 ENCOUNTER — Ambulatory Visit: Payer: 59 | Admitting: Urology

## 2018-07-09 ENCOUNTER — Telehealth: Payer: Self-pay | Admitting: Urology

## 2018-07-09 NOTE — Telephone Encounter (Signed)
mychart sent.

## 2018-07-09 NOTE — Telephone Encounter (Signed)
Would you check with Joseph Larson and see if he has been recording his nightly urine volumes and if so how much urine is he producing at night?

## 2018-09-11 ENCOUNTER — Other Ambulatory Visit: Payer: Self-pay | Admitting: Specialist

## 2018-09-11 DIAGNOSIS — R0602 Shortness of breath: Secondary | ICD-10-CM

## 2018-10-06 ENCOUNTER — Ambulatory Visit
Admission: RE | Admit: 2018-10-06 | Discharge: 2018-10-06 | Disposition: A | Discharge status: Home / Self Care | Payer: 59 | Source: Ambulatory Visit | Attending: Specialist | Admitting: Specialist

## 2018-10-06 ENCOUNTER — Other Ambulatory Visit: Payer: Self-pay

## 2018-10-06 DIAGNOSIS — R0602 Shortness of breath: Secondary | ICD-10-CM | POA: Diagnosis present

## 2018-11-25 ENCOUNTER — Other Ambulatory Visit: Payer: Self-pay | Admitting: Specialist

## 2018-11-25 DIAGNOSIS — R918 Other nonspecific abnormal finding of lung field: Secondary | ICD-10-CM

## 2018-12-23 ENCOUNTER — Other Ambulatory Visit: Payer: Self-pay

## 2018-12-23 ENCOUNTER — Ambulatory Visit
Admission: RE | Admit: 2018-12-23 | Discharge: 2018-12-23 | Disposition: A | Discharge status: Home / Self Care | Payer: 59 | Source: Ambulatory Visit | Attending: Specialist | Admitting: Specialist

## 2018-12-23 DIAGNOSIS — R918 Other nonspecific abnormal finding of lung field: Secondary | ICD-10-CM | POA: Insufficient documentation

## 2019-02-25 ENCOUNTER — Telehealth: Payer: Self-pay | Admitting: Urology

## 2019-02-25 NOTE — Telephone Encounter (Signed)
Please schedule Joseph Larson for a 6 month follow up for his PSA and prostate exam.

## 2019-02-26 ENCOUNTER — Other Ambulatory Visit: Payer: 59

## 2019-02-26 ENCOUNTER — Other Ambulatory Visit: Payer: Self-pay

## 2019-02-26 ENCOUNTER — Other Ambulatory Visit: Payer: Self-pay | Admitting: Family Medicine

## 2019-02-26 DIAGNOSIS — N401 Enlarged prostate with lower urinary tract symptoms: Secondary | ICD-10-CM

## 2019-02-26 DIAGNOSIS — N138 Other obstructive and reflux uropathy: Secondary | ICD-10-CM

## 2019-02-26 DIAGNOSIS — Z87898 Personal history of other specified conditions: Secondary | ICD-10-CM

## 2019-02-26 NOTE — Telephone Encounter (Signed)
Appt made and pt confirmed. °

## 2019-02-27 LAB — PSA: Prostate Specific Ag, Serum: 1.8 ng/mL (ref 0.0–4.0)

## 2019-03-09 NOTE — Progress Notes (Deleted)
03/10/2019 8:22 PM   [name] Feb 18, 1956 [id number]  Referring provider: Idelle Crouch, MD [place] [facility],  [place] [address]  No chief complaint on file.   HPI: Joseph Larson is a 63 y.o. male who has a history of elevated PSA (with HGPIN), erectile dysfunction and BPH/LUTS with urgency and nocturia who presents today for follow up.     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  We have been following his PSA's every 6 months.   PSA history  4.5 ng/mL on 09/06/2014  4.5 ng/mL on 03/01/2015  5.2 ng/mL on 09/02/2015 - started finasteride  2.6 (5.2) ng/mL on 12/05/2015  1.9 (3.8) ng/mL on 12/04/2016  2.5 (5.0) ng/mL on 05/27/2017  2.1 (4.2) ng/mL on 12/03/2017  1.3 (2.6) ng/mL on 06/02/2018  1.8 (3.6) ng/mL on 02/26/2019  Erectile dysfunction His SHIM score is ***, which is *** ED.   His previous SHIM score was 15.   His risk factors for ED are age, BPH, smoking (former), and pain medication.   He denies any painful erections or curvatures with his erections.   He is no longer having spontaneous erections.  He has tried Viagra in the past and found it ineffective, but has had success with EDEX.   He cannot inject himself with the EDEX due to his cervical neck surgeries and his wife is uncomfortable with the injection.     Score: 1-7 Severe ED 8-11 Moderate ED 12-16 Mild-Moderate ED 17-21 Mild ED 22-25 No ED   BPH WITH LUTS  (prostate and/or bladder) IPSS score: ***   PVR: *** mL    Previous score: 10/4  Previous PVR:  0 mL    Major complaint(s):  Frequency, urgency and nocturia.  Feels like the Myrbetriq started "kicking in the last few days"   Being able to hold it got better.  He does not want to continue the Myrbetriq as it has caused intolerable constipation.  Denies any dysuria, hematuria or  suprapubic pain.   He is still experiencing the right sided pain and night.  He describes the pain as the feeling of having a very full bladder and voiding relieves the pain.    He was not able to see his PCP for imaging studies.    Currently taking: Finasteride.   Not taking Vesicare due to finding it ineffective.  Denies any recent fevers, chills, nausea or vomiting.  He has a family history of PCa, with his brother having been diagnosed.  He is drinking a lot of coffee daily.      Score:  1-7 Mild 8-19 Moderate 20-35 Severe  PMH: Past Medical History:  Diagnosis Date  . Angina pectoris ([place])   . Anxiety   . Arthritis    osteoarthritis  . BPH (benign prostatic hyperplasia)   . Coronary atherosclerosis   . ED (erectile dysfunction)   . Elevated PSA   . Erectile dysfunction   . Esophageal spasm   . GERD (gastroesophageal reflux disease)   . Hyperlipidemia   . Hypertension   . Neuralgia   . Pancreatitis   . Rectus diastasis   . Rhinitis    chronic    Surgical History: Past Surgical History:  Procedure Laterality Date  . arthroscopic shoulder Left   . CARDIAC CATHETERIZATION  2010  .  CHOLECYSTECTOMY    . COLONOSCOPY WITH PROPOFOL N/A 07/01/2015   Procedure: COLONOSCOPY WITH PROPOFOL;  Surgeon: Manya Silvas, MD;  Location: [facility] ENDOSCOPY;  Service: Endoscopy;  Laterality: N/A;  . ESOPHAGOGASTRODUODENOSCOPY (EGD) WITH PROPOFOL N/A 07/01/2015   Procedure: ESOPHAGOGASTRODUODENOSCOPY (EGD) WITH PROPOFOL;  Surgeon: Manya Silvas, MD;  Location: [facility] ENDOSCOPY;  Service: Endoscopy;  Laterality: N/A;  . EUS  01/17/2012   Procedure: UPPER ENDOSCOPIC ULTRASOUND (EUS) LINEAR;  Surgeon: Milus Banister, MD;  Location: [facility] ENDOSCOPY;  Service: Endoscopy;  Laterality: N/A;  radial linear   . EXCISION MORTON'S NEUROMA     Left Foot  . HERNIA REPAIR     Right inguinal    Home Medications:  Allergies as of 03/10/2019   No Known Allergies     Medication List        Accurate as of March 09, 2019  8:22 PM. If you have any questions, ask your nurse or doctor.        albuterol 108 (90 Base) MCG/ACT inhaler Commonly known as: VENTOLIN HFA Inhale into the lungs.   alprostadil 10 MCG injection Commonly known as: Edex 10 mcg by Intracavitary route as needed for erectile dysfunction. use no more than 3 times per week   azelastine 0.1 % nasal spray Commonly known as: ASTELIN Place into the nose.   cyclobenzaprine 10 MG tablet Commonly known as: FLEXERIL   finasteride 5 MG tablet Commonly known as: PROSCAR TAKE 1 TABLET BY MOUTH  DAILY.   mirabegron ER 25 MG Tb24 tablet Commonly known as: MYRBETRIQ Take 1 tablet (25 mg total) by mouth daily.   nabumetone 500 MG tablet Commonly known as: RELAFEN Take 500 mg by mouth daily.   pantoprazole 40 MG tablet Commonly known as: PROTONIX Take 40 mg by mouth 2 (two) times daily.   ranitidine 300 MG tablet Commonly known as: ZANTAC Take by mouth.   solifenacin 5 MG tablet Commonly known as: VESIcare Take 1 tablet (5 mg total) by mouth daily.       Allergies: No Known Allergies  Family History: Family History  Problem Relation Age of Onset  . Prostate cancer Brother   . Kidney disease Neg Hx   . Kidney cancer Neg Hx   . Bladder Cancer Neg Hx     Social History:  reports that he quit smoking about 2 years ago. His smoking use included cigarettes. He has a 20.00 pack-year smoking history. He has never used smokeless tobacco. He reports that he does not drink alcohol or use drugs.  ROS:    Physical Exam: There were no vitals taken for this visit.  Constitutional:  Well nourished. Alert and oriented, No acute distress. HEENT: [place] AT, moist mucus membranes.  Trachea midline, no masses. Cardiovascular: No clubbing, cyanosis, or edema. Respiratory: Normal respiratory effort, no increased work of breathing. GI: Abdomen is soft, non tender, non distended, no abdominal masses. Liver and  spleen not palpable.  No hernias appreciated.  Stool sample for occult testing is not indicated.   GU: No CVA tenderness.  No bladder fullness or masses.  Patient with circumcised/uncircumcised phallus. ***Foreskin easily retracted***  Urethral meatus is patent.  No penile discharge. No penile lesions or rashes. Scrotum without lesions, cysts, rashes and/or edema.  Testicles are located scrotally bilaterally. No masses are appreciated in the testicles. Left and right epididymis are normal. Rectal: Patient with  normal sphincter tone. Anus and perineum without scarring or rashes. No rectal masses are appreciated. Prostate is approximately ***  grams, *** nodules are appreciated. Seminal vesicles are normal. Skin: No rashes, bruises or suspicious lesions. Lymph: No cervical or inguinal adenopathy. Neurologic: Grossly intact, no focal deficits, moving all 4 extremities. Psychiatric: Normal mood and affect.   Laboratory Data: Results for orders placed or performed in visit on 02/26/19  PSA  Result Value Ref Range   Prostate Specific Ag, Serum 1.8 0.0 - 4.0 ng/mL     I have reviewed the labs  Pertinent Imaging ***   Assessment & Plan:   1.   BPH with LUTS - IPSS score is 10/4, it is somewhat improved with Myrbetriq - but side effects are intolerable  - Continue conservative management, avoiding bladder irritants and timed voiding's - Most bothersome symptoms are urgency  - Continue finasteride 5 mg daily - RTC in 6 months for IPSS, PSA and exam   2. 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 Nocturia - Completed sleep study in 05/2015 - no sleep apnea - will take an urinal home and record nightly voided volumes for two weeks   4. 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    5. Erectile dysfunction  - May want to discuss other options in the future  6. Right side pain - measuring nightly voided volumes to see if this is the cause of the pain   No follow-ups on file.  Zara Council, PA-C  [facility] [address] [place], [place] [address] [phone]

## 2019-03-10 ENCOUNTER — Ambulatory Visit: Payer: 59 | Admitting: Urology

## 2019-03-10 DIAGNOSIS — N5201 Erectile dysfunction due to arterial insufficiency: Secondary | ICD-10-CM

## 2019-03-10 DIAGNOSIS — N138 Other obstructive and reflux uropathy: Secondary | ICD-10-CM

## 2019-03-26 ENCOUNTER — Telehealth: Payer: Self-pay | Admitting: *Deleted

## 2019-03-26 ENCOUNTER — Ambulatory Visit: Payer: 59 | Attending: Internal Medicine

## 2019-03-26 DIAGNOSIS — Z20822 Contact with and (suspected) exposure to covid-19: Secondary | ICD-10-CM

## 2019-03-26 NOTE — Telephone Encounter (Signed)
Patient's wife called for results ,still pending .

## 2019-03-30 ENCOUNTER — Telehealth: Payer: Self-pay

## 2019-03-30 NOTE — Telephone Encounter (Signed)
Pt called to get test results. Results still pending.   [place]

## 2019-03-31 ENCOUNTER — Ambulatory Visit: Payer: 59 | Attending: Internal Medicine

## 2019-03-31 DIAGNOSIS — Z20822 Contact with and (suspected) exposure to covid-19: Secondary | ICD-10-CM

## 2019-04-01 ENCOUNTER — Ambulatory Visit: Payer: 59 | Admitting: Urology

## 2019-04-01 LAB — NOVEL CORONAVIRUS, NAA

## 2019-04-02 LAB — NOVEL CORONAVIRUS, NAA: SARS-CoV-2, NAA: NOT DETECTED

## 2019-04-14 NOTE — Progress Notes (Signed)
04/15/2019 9:46 AM   Joseph Larson 1956-01-02 [id number]  Referring provider: Idelle Crouch, MD [place] [facility] [place],  [place] [address]  Chief Complaint  Patient presents with  . Benign Prostatic Hypertrophy    HPI: Joseph Larson is a 64 y.o. male who has a history of elevated PSA (with HGPIN), erectile dysfunction and BPH/LUTS with urgency and nocturia who presents today for a follow up.      BPH WITH LUTS  (prostate and/or bladder) IPSS score: 13/3  PVR: 0 mL    Previous score: 10/4  Previous PVR:  0 mL    Major complaint(s): Frequency, urgency and nocturia.  Patient denies any modifying or aggravating factors.  Patient denies any gross hematuria, dysuria or suprapubic/flank pain.  Patient denies any fevers, chills, nausea or vomiting.   Continues to drink a large amount of caffeine products, mostly coffee.  He states he drinks a lot of coffee because he enjoys it.  Currently taking: Finasteride.   Not taking Vesicare due to finding it ineffective.  Myrbetriq caused intolerable constipation.    Completed a sleep study in 2017 and did not have sleep apnea   He has a family history of PCa, with his brother having been diagnosed.    IPSS    Row Name 04/15/19 0900         International Prostate Symptom Score   How often have you had the sensation of not emptying your bladder?  Not at All     How often have you had to urinate less than every two hours?  About half the time     How often have you found you stopped and started again several times when you urinated?  Less than 1 in 5 times     How often have you found it difficult to postpone urination?  More than half the time     How often have you had a weak urinary stream?  About half the time     How often have you had to strain to start urination?  Not at All     How many times did you typically get up at night to urinate?  2 Times     Total IPSS Score  13       Quality of Life due to  urinary symptoms   If you were to spend the rest of your life with your urinary condition just the way it is now how would you feel about that?  Mixed        Score:  1-7 Mild 8-19 Moderate 20-35 Severe   Erectile dysfunction SHIM score: 5  Risk factors:  age, BPH and HTN    No  painful erections or curvatures with his erections.    Still not having spontaneous erections Tried:  Edex effective but unable to self inject and wife is not comfortable   SHIM    Row Name 04/15/19 0933         SHIM: Over the last 6 months:   How do you rate your confidence that you could get and keep an erection?  Very Low     When you had erections with sexual stimulation, how often were your erections hard enough for penetration (entering your partner)?  Almost Never or Never     During sexual intercourse, how often were you able to maintain your erection after you had penetrated (entered) your partner?  Almost Never or Never  During sexual intercourse, how difficult was it to maintain your erection to completion of intercourse?  Extremely Difficult     When you attempted sexual intercourse, how often was it satisfactory for you?  Almost Never or Never       SHIM Total Score   SHIM  5        Score: 1-7 Severe ED 8-11 Moderate ED 12-16 Mild-Moderate ED 17-21 Mild ED 22-25 No ED  PMH: Past Medical History:  Diagnosis Date  . Angina pectoris ([place])   . Anxiety   . Arthritis    osteoarthritis  . BPH (benign prostatic hyperplasia)   . Coronary atherosclerosis   . ED (erectile dysfunction)   . Elevated PSA   . Erectile dysfunction   . Esophageal spasm   . GERD (gastroesophageal reflux disease)   . Hyperlipidemia   . Hypertension   . Neuralgia   . Pancreatitis   . Rectus diastasis   . Rhinitis    chronic    Surgical History: Past Surgical History:  Procedure Laterality Date  . arthroscopic shoulder Left   . CARDIAC CATHETERIZATION  2010  . CHOLECYSTECTOMY    . COLONOSCOPY  WITH PROPOFOL N/A 07/01/2015   Procedure: COLONOSCOPY WITH PROPOFOL;  Surgeon: Manya Silvas, MD;  Location: [facility] ENDOSCOPY;  Service: Endoscopy;  Laterality: N/A;  . ESOPHAGOGASTRODUODENOSCOPY (EGD) WITH PROPOFOL N/A 07/01/2015   Procedure: ESOPHAGOGASTRODUODENOSCOPY (EGD) WITH PROPOFOL;  Surgeon: Manya Silvas, MD;  [facility] ENDOSCOPY;  Service: Endoscopy;  Laterality: N/A;  . EUS  01/17/2012   Procedure: UPPER ENDOSCOPIC ULTRASOUND (EUS) LINEAR;  Surgeon: Milus Banister, MD;  Location: [facility] ENDOSCOPY;  Service: Endoscopy;  Laterality: N/A;  radial linear   . EXCISION MORTON'S NEUROMA     Left Foot  . HERNIA REPAIR     Right inguinal    Home Medications:  Allergies as of 04/15/2019   No Known Allergies     Medication List       Accurate as of April 15, 2019  9:46 AM. If you have any questions, ask your nurse or doctor.        STOP taking these medications   azelastine 0.1 % nasal spray Commonly known as: ASTELIN Stopped by: Elise Knobloch, PA-C   nabumetone 500 MG tablet Commonly known as: RELAFEN Stopped by: Makaley Storts, PA-C   ranitidine 300 MG tablet Commonly known as: ZANTAC Stopped by: Zara Council, PA-C     TAKE these medications   albuterol 108 (90 Base) MCG/ACT inhaler Commonly known as: VENTOLIN HFA Inhale into the lungs.   alprostadil 10 MCG injection Commonly known as: Edex 10 mcg by Intracavitary route as needed for erectile dysfunction. use no more than 3 times per week   cyclobenzaprine 10 MG tablet Commonly known as: FLEXERIL   finasteride 5 MG tablet Commonly known as: PROSCAR TAKE 1 TABLET BY MOUTH  DAILY.   meloxicam 15 MG tablet Commonly known as: MOBIC   mirabegron ER 25 MG Tb24 tablet Commonly known as: MYRBETRIQ Take 1 tablet (25 mg total) by mouth daily.   pantoprazole 40 MG tablet Commonly known as: PROTONIX Take 40 mg by mouth 2 (two) times daily.   solifenacin 5 MG tablet Commonly known as: VESIcare Take 1  tablet (5 mg total) by mouth daily.       Allergies: No Known Allergies  Family History: Family History  Problem Relation Age of Onset  . Prostate cancer Brother   . Kidney disease Neg Hx   .  Kidney cancer Neg Hx   . Bladder Cancer Neg Hx     Social History:  reports that he quit smoking about 2 years ago. His smoking use included cigarettes. He has a 20.00 pack-year smoking history. He has never used smokeless tobacco. He reports that he does not drink alcohol or use drugs.  ROS: UROLOGY Frequent Urination?: Yes Hard to postpone urination?: Yes Burning/pain with urination?: No Get up at night to urinate?: Yes Leakage of urine?: No Urine stream starts and stops?: No Trouble starting stream?: No Do you have to strain to urinate?: No Blood in urine?: No Urinary tract infection?: No Sexually transmitted disease?: No Injury to kidneys or bladder?: No Painful intercourse?: No Weak stream?: No Erection problems?: Yes Penile pain?: No Gastrointestinal Nausea?: No Vomiting?: No Indigestion/heartburn?: Yes Diarrhea?: No Constipation?: No Constitutional Fever: No Night sweats?: No Weight loss?: No Fatigue?: No Skin Skin rash/lesions?: No Itching?: No Eyes Blurred vision?: Yes Double vision?: No Ears/Nose/Throat Sore throat?: No Sinus problems?: Yes Hematologic/Lymphatic Swollen glands?: No Easy bruising?: Yes Cardiovascular Leg swelling?: No Chest pain?: No Respiratory Cough?: No Shortness of breath?: No Endocrine Excessive thirst?: No Musculoskeletal Back pain?: Yes Joint pain?: No Neurological Headaches?: No Dizziness?: No Psychologic Depression?: No Anxiety?: No  Physical Exam: BP (!) 145/77   Pulse 63   Ht 6' (1.829 m)   Wt 186 lb 12.8 oz (84.7 kg)   BMI 25.33 kg/m   Constitutional:  Well nourished. Alert and oriented, No acute distress. HEENT: [place] AT, mask in place.  Trachea midline, no masses. Cardiovascular: No clubbing, cyanosis, or  edema. Respiratory: Normal respiratory effort, no increased work of breathing. GI: Abdomen is soft, non tender, non distended, no abdominal masses. Liver and spleen not palpable.  No hernias appreciated.  Stool sample for occult testing is not indicated.   GU: No CVA tenderness.  No bladder fullness or masses.  Patient with circumcised phallus.  Urethral meatus is patent.  No penile discharge. No penile lesions or rashes. Scrotum without lesions, cysts, rashes and/or edema.  Testicles are located scrotally bilaterally. No masses are appreciated in the testicles. Left and right epididymis are normal. Rectal: Patient with  normal sphincter tone. Anus and perineum without scarring or rashes. No rectal masses are appreciated. Prostate is approximately 50 grams, could only palpate the apex and midportion of the gland, no nodules are appreciated. Seminal vesicles could not be palpated. Skin: No rashes, bruises or suspicious lesions. Lymph: No inguinal adenopathy. Neurologic: Grossly intact, no focal deficits, moving all 4 extremities. Psychiatric: Normal mood and affect.  Laboratory Data:  PSA history  Component     Latest Ref Rng & Units 09/06/2014 03/01/2015 09/02/2015 12/05/2015  Prostate Specific Ag, Serum     0.0 - 4.0 ng/mL 4.5 (H) 4.5 (H) 5.2 (H) 2.6   Component     Latest Ref Rng & Units 05/31/2016 12/04/2016 05/27/2017 12/03/2017  Prostate Specific Ag, Serum     0.0 - 4.0 ng/mL 2.4 1.9 2.5 2.1   Component     Latest Ref Rng & Units 06/02/2018 02/26/2019  Prostate Specific Ag, Serum     0.0 - 4.0 ng/mL 1.3 1.8   TSH 2.843   On 05/08/2016  Testosterone 522 ng/dL on 06/06/2016  I have reviewed the labs  Pertinent Imaging Results for HARIS, BAACK (MRN [id number]) as of 04/15/2019 09:39  Ref. Range 04/15/2019 09:34  Scan Result Unknown 0     Assessment & Plan:   1.   BPH with  LUTS - IPSS score is 13/3, it not improved - Continue conservative management, avoiding bladder irritants and  timed voiding's - Most bothersome symptoms urgency - Continue finasteride 5 mg daily - RTC in 6 months for IPSS, PSA and exam   2. Urgency - Vesicare and Myrbetriq not effective - did not reduce coffee intake  - Will schedule cystoscopy for refractory OAB  I have explained to the patient that they will  be scheduled for a cystoscopy in our office to evaluate their bladder.  The cystoscopy consists of passing a tube with a lens up through their urethra and into their urinary bladder.   We will inject the urethra with a lidocaine gel prior to introducing the cystoscope to help with any discomfort during the procedure.   After the procedure, they might experience blood in the urine and discomfort with urination.  This will abate after the first few voids.  I have  encouraged the patient to increase water intake  during this time.  Patient denies any allergies to lidocaine.   3. 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    4. Erectile dysfunction - SHIM score is 5 -He did have satisfactory erections with the Edex, but he is unable to self inject and his wife is no longer comfortable injecting the medication -I did briefly discuss penile prosthesis placement and gave him the DVD for further review  Return for cystoscopy for refractory urgency .  Zara Council, PA-C  [facility] [address] [place], [place] [address] [phone]

## 2019-04-15 ENCOUNTER — Encounter: Payer: Self-pay | Admitting: Urology

## 2019-04-15 ENCOUNTER — Other Ambulatory Visit: Payer: Self-pay

## 2019-04-15 ENCOUNTER — Ambulatory Visit: Payer: BC Managed Care – PPO | Admitting: Urology

## 2019-04-15 VITALS — BP 145/77 | HR 63 | Ht 72.0 in | Wt 186.8 lb

## 2019-04-15 DIAGNOSIS — R3915 Urgency of urination: Secondary | ICD-10-CM | POA: Diagnosis not present

## 2019-04-15 DIAGNOSIS — N5201 Erectile dysfunction due to arterial insufficiency: Secondary | ICD-10-CM | POA: Diagnosis not present

## 2019-04-15 DIAGNOSIS — Z87898 Personal history of other specified conditions: Secondary | ICD-10-CM

## 2019-04-15 DIAGNOSIS — N138 Other obstructive and reflux uropathy: Secondary | ICD-10-CM

## 2019-04-15 DIAGNOSIS — N401 Enlarged prostate with lower urinary tract symptoms: Secondary | ICD-10-CM

## 2019-04-15 DIAGNOSIS — R351 Nocturia: Secondary | ICD-10-CM

## 2019-04-15 LAB — BLADDER SCAN AMB NON-IMAGING: Scan Result: 0

## 2019-05-09 ENCOUNTER — Ambulatory Visit: Payer: BC Managed Care – PPO | Attending: Internal Medicine

## 2019-05-09 DIAGNOSIS — Z23 Encounter for immunization: Secondary | ICD-10-CM | POA: Insufficient documentation

## 2019-05-09 NOTE — Progress Notes (Signed)
   Covid-19 Vaccination Clinic  Name:  Joseph Larson    MRN: [id number] DOB: Aug 19, 1955  05/09/2019  [name] was observed post Covid-19 immunization for 15 minutes without incidence. He was provided with Vaccine Information Sheet and instruction to access the V-Safe system.   [name] was instructed to call 911 with any severe reactions post vaccine: Mar[place] Kitchen Difficulty breathing  . Swelling of your face and throat  . A fast heartbeat  . A bad rash all over your body  . Dizziness and weakness    Immunizations Administered    Name Date Dose VIS Date Route   Pfizer COVID-19 Vaccine 05/09/2019  9:02 AM 0.3 mL 03/06/2019 Intramuscular   Manufacturer: [place]   Lot: [id number]   [place]: [id number]

## 2019-05-13 ENCOUNTER — Encounter: Payer: Self-pay | Admitting: Urology

## 2019-05-13 ENCOUNTER — Other Ambulatory Visit: Payer: Self-pay

## 2019-05-13 ENCOUNTER — Ambulatory Visit (INDEPENDENT_AMBULATORY_CARE_PROVIDER_SITE_OTHER): Payer: BC Managed Care – PPO | Admitting: Urology

## 2019-05-13 VITALS — BP 135/69 | HR 66 | Ht 72.0 in | Wt 186.0 lb

## 2019-05-13 DIAGNOSIS — N138 Other obstructive and reflux uropathy: Secondary | ICD-10-CM | POA: Diagnosis not present

## 2019-05-13 DIAGNOSIS — R3915 Urgency of urination: Secondary | ICD-10-CM | POA: Diagnosis not present

## 2019-05-13 DIAGNOSIS — N3281 Overactive bladder: Secondary | ICD-10-CM

## 2019-05-13 DIAGNOSIS — N401 Enlarged prostate with lower urinary tract symptoms: Secondary | ICD-10-CM | POA: Diagnosis not present

## 2019-05-13 LAB — URINALYSIS, COMPLETE
Bilirubin, UA: NEGATIVE
Glucose, UA: NEGATIVE
Ketones, UA: NEGATIVE
Leukocytes,UA: NEGATIVE
Nitrite, UA: NEGATIVE
Protein,UA: NEGATIVE
RBC, UA: NEGATIVE
Specific Gravity, UA: 1.025 (ref 1.005–1.030)
Urobilinogen, Ur: 0.2 mg/dL (ref 0.2–1.0)
pH, UA: 6.5 (ref 5.0–7.5)

## 2019-05-13 LAB — MICROSCOPIC EXAMINATION
Bacteria, UA: NONE SEEN
RBC, Urine: NONE SEEN /hpf (ref 0–2)

## 2019-05-13 MED ORDER — LIDOCAINE HCL URETHRAL/MUCOSAL 2 % EX GEL
1.0000 "application " | Freq: Once | CUTANEOUS | Status: AC
  Administered 2019-05-13: 1 via URETHRAL

## 2019-05-13 NOTE — Addendum Note (Signed)
Addended by: Donalee Citrin on: 05/13/2019 10:21 AM   Modules accepted: Orders

## 2019-05-13 NOTE — Patient Instructions (Signed)
Urodynamic Testing What is urodynamic testing?  Urodynamic tests are done to determine how well your lower urinary tract is working. The lower urinary tract includes your bladder and the part of your body that drains urine from the bladder (urethra). When your kidneys filter your blood, urine is stored in your bladder until you feel the urge to urinate. Urination requires coordination between the nerves and muscles of your bladder and urethra. When your lower urinary tract is working well, you should be able to:  Start urinating when your bladder is full.  Empty your bladder completely.  Control the flow of your urine. Why do I need urodynamic testing? You may need urodynamic testing to help find the cause of any of these problems:  Leaking urine (incontinence).  Problems starting or stopping your urine flow.  Frequent or painful urination.  Frequent urinary tract infections.  Being unable to empty your bladder completely.  Having strong urges to pass urine (urgency).  Having a weak flow of urine. How do I prepare for the tests?  Ask your health care provider about changing or stopping your regular medicines. This is especially important if you are taking diabetes medicines or blood thinners.  You may be asked to avoid urinating before coming to the test so that you arrive with a full bladder.  Tell a health care provider about: ? Any allergies you have. ? All medicines you are taking, including vitamins, herbs, eye drops, creams, and over-the-counter medicines. ? Whether you are pregnant or may be pregnant. What are the risks of this testing? Generally, these tests are safe. However, some of the tests have risks, including:  Discomfort.  Frequent urge to urinate.  Bleeding.  Infection.  Allergic reactions to medicines or dyes (contrast material). How is urodynamic testing done? You may have various urodynamic tests. The tests may be done separately or may all be  done during one testing visit. You may be given an antibiotic medicine before or after testing to help prevent infection. The types of tests that may be done include: Uroflowmetry This test measures how much urine you pass and how long it takes to pass.  You will urinate into a certain type of toilet or device (flowmeter).  The device will measure the volume and the time of your urine flow.  These measurements will be sent to a computer that creates a graph of your urine flow. Postvoid residual measurement This test measures how much urine is left in your bladder after you urinate.  The test may be done with ultrasound. In this method, sound waves and a computer will be used to create an image of your bladder.  The test can also be done by inserting a thin, flexible tube (catheter) into your bladder after you urinate. The remaining urine will be removed through the catheter so it can be measured.  Remaining urine will be measured in milliliters (mL). If you have more than 100 mL left in your bladder after you urinate, your bladder is not emptying as it should. Cystometric testing This test uses a type of bladder catheter that can measure pressure.  You may be given a medicine to numb the area (local anesthetic).  The area around the opening of your urethra will be cleaned.  A urinary catheter will be passed through your urethra into your bladder and used to empty your bladder completely.  Then a measuring catheter will be placed, and your bladder will be filled with warm, germ-free (sterile) water.  Pressure measurements will be taken: ? As your bladder fills. ? When you feel the need to urinate. ? As your bladder is emptied.  You may be asked to cough or bear down to check for leakage.  In some cases, your bladder may be filled with a material that shows up on X-rays (contrast material) so that X-ray pictures can be taken during the test. Electromyogram This test measures the  electrical activity of the nerves and muscles of your bladder and the opening of your urethra.  Sticky patches (electrodes) will be placed near your rectum and urethra to measure electrical activity.  The measurements will show how well your nerves are communicating with your muscles. What happens after the testing?  You should be able to go home right away and do your usual activities.  You may be told to drink a glass of water every 30 minutes for the first 2 hours after testing.  Taking a warm bath or using warm, wet cloths (warm compresses) may relieve any discomfort near your urethra.  Contact your health care provider if you have: ? Pain. ? Blood in your urine. ? Chills. ? Fever. What do the results mean? Talk with your health care provider about what your results mean. Some common causes for abnormal results from urodynamic tests include:  Enlarged prostate in men.  Overactive bladder.  Urinary tract infection.  Nervous system diseases.  Spinal cord damage. Questions to ask your health care provider Ask your health care provider, or the department that is doing the test:  When will my results be ready?  How will I get my results?  What are my treatment options?  What other tests do I need?  What are my next steps? Summary  Urodynamic tests are done to determine how well your lower urinary tract is working. The lower urinary tract includes your bladder and urethra.  You may need urodynamic testing to help find the cause of various problems with urination, such as leaking urine (incontinence) or problems starting or stopping your urine flow.  You may have various urodynamic tests. The tests may be done separately or may all be done during one testing visit.  Talk with your health care provider about what your results mean.  Contact your health care provider if you have pain, chills, a fever, or blood in your urine. This information is not intended to replace  advice given to you by your health care provider. Make sure you discuss any questions you have with your health care provider. Document Revised: 07/01/2018 Document Reviewed: 01/14/2017 Elsevier Patient Education  [place].  Holmium Laser Enucleation of the Prostate (HoLEP)  HoLEP is a treatment for men with benign prostatic hyperplasia (BPH). The laser surgery removed blockages of urine flow, and is done without any incisions on the body.     What is HoLEP?  HoLEP is a type of laser surgery used to treat obstruction (blockage) of urine flow as a result of benign prostatic hyperplasia (BPH). In men with BPH, the prostate gland is not cancerous, but has become enlarged. An enlarged prostate can result in a number of urinary tract symptoms such as weak urinary stream, difficulty in starting urination, inability to urinate, frequent urination, or getting up at night to urinate.  HoLEP was developed in the 1990's as a more effective and less expensive surgical option for BPH, compared to other surgical options such as laser vaporization(PVP/greenlight laser), transurethral resection of the prostate(TURP), and open simple prostatectomy.  What happens during a HoLEP?  HoLEP requires general anesthesia ("asleep" throughout the procedure).   An antibiotic is given to reduce the risk of infection  A surgical instrument called a resectoscope is inserted through the urethra (the tube that carries urine from the bladder). The resectoscope has a camera that allows the surgeon to view the internal structure of the prostate gland, and to see where the incisions are being made during surgery.  The laser is inserted into the resectoscope and is used to enucleate (free up) the enlarged prostate tissue from the capsule (outer shell) and then to seal up any blood vessels. The tissue that has been removed is pushed back into the bladder.  A morcellator is placed through the resectoscope, and is used  to suction out the prostate tissue that has been pushed into the bladder.  When the prostate tissue has been removed, the resectoscope is removed, and a foley catheter is placed to allow healing and drain the urine from the bladder.     What happens after a HoLEP?  More than 90% of patients go home the same day a few hours after surgery. Less than 10% will be admitted to the hospital overnight for observation to monitor the urine, or if they have other medical problems.  Fluid is flushed through the catheter for about 1 hour after surgery to clear any blood from the urine. It is normal to have some blood in the urine after surgery. The need for blood transfusion is extremely rare.  Eating and drinking are permitted after the procedure once the patient has fully awakened from anesthesia.  The catheter is usually removed 2-3 days after surgery- the patient will come to clinic to have the catheter removed and make sure they can urinate on their own.  It is very important to drink lots of fluids after surgery for one week to keep the bladder flushed.  At first, there may be some burning with urination, but this typically improved within a few hours to days. Most patients do not have a significant amount of pain, and narcotic pain medications are rarely needed.  Symptoms of urinary frequency, urgency, and even leakage are NORMAL for the first few weeks after surgery as the bladder adjusts after having to work hard against blockage from the prostate for many years. This will improve, but can sometimes take several months.  The use of pelvic floor exercises (Kegel exercises) can help improve problems with urinary incontinence.   After catheter removal, patients will be seen at 6 weeks and 6 months for symptom check  No heavy lifting for at least 2-3 weeks after surgery, however patients can walk and do light activities the first day after surgery. Return to work time depends on  occupation.    What are the advantages of HoLEP?  HoLEP has been studied in many different parts of the world and has been shown to be a safe and effective procedure. Although there are many types of BPH surgeries available, HoLEP offers a unique advantage in being able to remove a large amount of tissue without any incisions on the body, even in very large prostates, while decreasing the risk of bleeding and providing tissue for pathology (to look for cancer). This decreases the need for blood transfusions during surgery, minimizes hospital stay, and reduces the risk of needing repeat treatment.  What are the side effects of HoLEP?  Temporary burning and bleeding during urination. Some blood may be seen in the urine for  weeks after surgery and is part of the healing process.  Urinary incontinence (inability to control urine flow) is expected in all patients immediately after surgery and they should wear pads for the first few days/weeks. This typically improves over the course of several weeks. Performing Kegel exercises can help decrease leakage from stress maneuvers such as coughing, sneezing, or lifting. The rate of long term leakage is very low. Patients may also have leakage with urgency and this may be treated with medication. The risk of urge incontinence can be dependent on several factors including age, prostate size, symptoms, and other medical problems.  Retrograde ejaculation or "backwards ejaculation." In 75% of cases, the patient will not see any fluid during ejaculation after surgery.  Erectile function is generally not significantly affected.   What are the risks of HoLEP?  Injury to the urethra or development of scar tissue at a later date  Injury to the capsule of the prostate (typically treated with longer catheterization).  Injury to the bladder or ureteral orifices (where the urine from the kidney drains out)  Infection of the bladder, testes, or kidneys  Return of  urinary obstruction at a later date requiring another operation (<2%)  Need for blood transfusion or re-operation due to bleeding  Failure to relieve all symptoms and/or need for prolonged catheterization after surgery  5-15% of patients are found to have previously undiagnosed prostate cancer in their specimen. Prostate cancer can be treated after HoLEP.  Standard risks of anesthesia including blood clots, heart attacks, etc  When should I call my doctor?  Fever over 101.3 degrees  Inability to urinate, or large blood clots in the urine

## 2019-05-13 NOTE — Progress Notes (Signed)
Cystoscopy Procedure Note:  Indication: Frequency  After informed consent and discussion of the procedure and its risks, Joseph Larson was positioned and prepped in the standard fashion. Cystoscopy was performed with a flexible cystoscope. The urethra, bladder neck and entire bladder was visualized in a standard fashion.  No urethral abnormalities.  The prostate was moderate in size with a large anterior intravesical lobe of tissue on retroflexion. The ureteral orifices were visualized in their normal location and orientation.   Imaging: Prostate measures 50g on CT from 06/2015  Labs: PSA 02/2019: 1.8(corrected to 3.6 on finasteride)  Findings: Normal urethra, moderate size prostate with large anterior intravesical lobe   ----------------------------------------------------------------------------------------------------------------------------   Assessment and Plan: He is a 64 year old male with bothersome urinary symptoms of urgency, frequency, and nocturia refractory to OAB medications.  He is on finasteride long-term with only minimal improvement in his symptoms.  He consumes a large amount of coffee, but he does not feel this contributes to his symptoms.  He is interested in other options for improvement in his urination.  We had a long discussion about options moving forward including observation, trial of different OAB or BPH medication, or further investigation with urodynamics.  With his cystoscopy findings of a large anterior intravesical lobe and symptoms of primarily urgency and frequency, I recommended urodynamics prior to undergoing any outlet procedure.  We discussed the risks and benefits at length.  We discussed that if he has an overactive bladder on urodynamics, he may benefit from pelvic floor therapy, PTNS, and other behavioral strategies; whereas if he has bladder outlet obstruction he likely would benefit significantly from a HoLEP or TURP.  He elects to proceed with  urodynamics, and will follow up after completion to discuss results and neck steps of care  I spent 20 total minutes on the day of the encounter including pre-visit review of the medical record, face-to-face time with the patient discussing his urinary symptoms and treatment options, and post visit ordering of labs/imaging/tests.   Nickolas Madrid, MD 05/13/2019

## 2019-05-13 NOTE — Addendum Note (Signed)
Addended by: Donalee Citrin on: 05/13/2019 09:14 AM   Modules accepted: Orders

## 2019-05-14 ENCOUNTER — Other Ambulatory Visit: Payer: Self-pay | Admitting: Specialist

## 2019-05-14 DIAGNOSIS — R911 Solitary pulmonary nodule: Secondary | ICD-10-CM

## 2019-05-27 ENCOUNTER — Telehealth: Payer: Self-pay | Admitting: Urology

## 2019-05-27 DIAGNOSIS — N3281 Overactive bladder: Secondary | ICD-10-CM

## 2019-05-27 DIAGNOSIS — R3915 Urgency of urination: Secondary | ICD-10-CM

## 2019-05-27 NOTE — Telephone Encounter (Signed)
Can you place UDS referral?, thanks  Nickolas Madrid, MD 05/27/2019

## 2019-05-27 NOTE — Telephone Encounter (Signed)
Patient called and stated that he was supposed to have been referred out for UDS but I have not received a referral for this. Can you put in a referral please/  thanks

## 2019-05-28 NOTE — Telephone Encounter (Signed)
Referral placed.

## 2019-05-31 ENCOUNTER — Other Ambulatory Visit: Payer: Self-pay | Admitting: Urology

## 2019-06-01 ENCOUNTER — Telehealth: Payer: Self-pay

## 2019-06-01 NOTE — Telephone Encounter (Signed)
Pt called and LM on triage line stating that he was instructed to call after having his referral.  Pt was referred for UDS. Called pt back to schedule follow up appointment, no answer. LM for pt to call back.

## 2019-06-02 ENCOUNTER — Other Ambulatory Visit: Payer: Self-pay | Admitting: Urology

## 2019-06-02 ENCOUNTER — Ambulatory Visit: Payer: BC Managed Care – PPO | Attending: Internal Medicine

## 2019-06-02 DIAGNOSIS — Z23 Encounter for immunization: Secondary | ICD-10-CM | POA: Insufficient documentation

## 2019-06-02 NOTE — Progress Notes (Signed)
   Covid-19 Vaccination Clinic  Name:  GERALDO HARIS    MRN: [id number] DOB: 1955-07-29  06/02/2019  Mr. Locklin was observed post Covid-19 immunization for 15 minutes without incident. He was provided with Vaccine Information Sheet and instruction to access the V-Safe system.   Mr. Vinzant was instructed to call 911 with any severe reactions post vaccine: Marland Kitchen Difficulty breathing  . Swelling of face and throat  . A fast heartbeat  . A bad rash all over body  . Dizziness and weakness   Immunizations Administered    Name Date Dose VIS Date Route   Pfizer COVID-19 Vaccine 06/02/2019  2:18 PM 0.3 mL 03/06/2019 Intramuscular   Manufacturer: [place]   Lot: [id number]   [place]: [id number]

## 2019-06-09 ENCOUNTER — Other Ambulatory Visit: Payer: Self-pay

## 2019-06-09 ENCOUNTER — Encounter: Payer: Self-pay | Admitting: Urology

## 2019-06-09 ENCOUNTER — Ambulatory Visit: Payer: BC Managed Care – PPO | Admitting: Urology

## 2019-06-09 VITALS — BP 160/82 | HR 74 | Ht 72.0 in | Wt 186.0 lb

## 2019-06-09 DIAGNOSIS — R350 Frequency of micturition: Secondary | ICD-10-CM

## 2019-06-09 DIAGNOSIS — N401 Enlarged prostate with lower urinary tract symptoms: Secondary | ICD-10-CM

## 2019-06-09 NOTE — Patient Instructions (Signed)

## 2019-06-09 NOTE — Progress Notes (Signed)
   06/09/2019 4:04 PM   Joseph Larson [birth year] [id number]  Reason for visit: Review urodynamics, discuss follow-up  HPI: I saw Joseph Larson back in urology clinic for discussion of his urinary symptoms.  To briefly summarize he is a 64 year old healthy male who has a long history of bothersome urinary symptoms of primarily urinary frequency and nocturia.  He is previously been followed by our PA and trialed on numerous OAB medications without any improvement in his symptoms.  He recently underwent a cystoscopy with me that showed a large prostate with a large intravesical median lobe.  He has been on finasteride long-term with only minimal improvement in his urinary symptoms.  Recent PSA is 1.8, corrected to 3.6 for finasteride which is within the normal range.  We opted for urodynamics prior to pursuing a bladder outlet procedure in the setting of his primarily overactive symptoms.  I personally reviewed his urodynamic study today.  Max capacity was 260 mL.  First sensation occurred at 104 mL.  Normal desire occurred at 153 mL.  Strong desire occurred at 254 mils.  The bladder was unstable with low amplitude instability with the first unstable contraction occurring at 253 mL.  He felt an increased urge at that time but was able to inhibit the contraction without leaking.  There was no stress incontinence.  He voided with an obstructed flow pattern with high detrusor pressures.  Max flow was 7 mL/s and detrusor pressure at peak flow was 57 cm of water.  We had a long conversation today that his urinary symptoms, cystoscopy findings, and urodynamics.  He continues to be extremely bothered by his urinary symptoms and is interested in pursuing more definitive management.  With his objective demonstration of bladder outlet obstruction on urodynamics and large intravesical median lobe, I do think he would benefit from an outlet procedure.  With his large intravesical lobe I think he would benefit from HOLEP  or TURP much more than UroLift.  We discussed the risks and benefits of HoLEP at length.  The procedure requires general anesthesia and takes 2 to 3 hours, and a holmium laser is used to enucleate the prostate and push this tissue into the bladder.  A morcellator is then used to remove this tissue, which is sent for pathology.  The vast majority of patients are able to discharge the same day with a catheter in place for 2 to 3 days, and will follow-up in clinic for a voiding trial.  Approximately 5% of patients will be admitted overnight to monitor the urine, or if they have multiple co-morbidities.  We specifically discussed the risks of bleeding, infection, retrograde ejaculation, temporary urgency and urge incontinence, very low risk of long-term incontinence, pathologic evaluation of prostate tissue and possible detection of prostate cancer or other malignancy, and possible need for additional procedures.  Schedule HoLEP Referral to pelvic floor physical therapy   I spent 30 total minutes on the day of the encounter including pre-visit review of the medical record, face-to-face time with the patient, and post visit ordering of labs/imaging/tests.  Billey Co, [facility] [address] [place], [place] [address] [phone]

## 2019-06-16 ENCOUNTER — Other Ambulatory Visit: Payer: Self-pay | Admitting: Urology

## 2019-06-16 ENCOUNTER — Other Ambulatory Visit: Payer: BC Managed Care – PPO

## 2019-06-16 ENCOUNTER — Other Ambulatory Visit: Payer: Self-pay

## 2019-06-16 DIAGNOSIS — N138 Other obstructive and reflux uropathy: Secondary | ICD-10-CM

## 2019-06-16 DIAGNOSIS — N401 Enlarged prostate with lower urinary tract symptoms: Secondary | ICD-10-CM

## 2019-06-17 ENCOUNTER — Other Ambulatory Visit: Payer: Self-pay | Admitting: Urology

## 2019-06-17 DIAGNOSIS — N401 Enlarged prostate with lower urinary tract symptoms: Secondary | ICD-10-CM

## 2019-06-17 DIAGNOSIS — N138 Other obstructive and reflux uropathy: Secondary | ICD-10-CM

## 2019-06-17 LAB — URINALYSIS, COMPLETE
Bilirubin, UA: NEGATIVE
Glucose, UA: NEGATIVE
Ketones, UA: NEGATIVE
Leukocytes,UA: NEGATIVE
Nitrite, UA: NEGATIVE
Protein,UA: NEGATIVE
RBC, UA: NEGATIVE
Specific Gravity, UA: >1.03 — ABNORMAL HIGH (ref 1.005–1.030)
Urobilinogen, Ur: 0.2 mg/dL (ref 0.2–1.0)
pH, UA: 6.5 (ref 5.0–7.5)

## 2019-06-17 LAB — MICROSCOPIC EXAMINATION
Bacteria, UA: NONE SEEN
Epithelial Cells (non renal): NONE SEEN /hpf (ref 0–10)

## 2019-06-19 LAB — CULTURE, URINE COMPREHENSIVE

## 2019-06-24 ENCOUNTER — Other Ambulatory Visit: Payer: Self-pay

## 2019-06-24 ENCOUNTER — Encounter
Admission: RE | Admit: 2019-06-24 | Discharge: 2019-06-24 | Disposition: A | Discharge status: Home / Self Care | Payer: BC Managed Care – PPO | Source: Ambulatory Visit | Attending: Urology | Admitting: Urology

## 2019-06-24 ENCOUNTER — Other Ambulatory Visit: Payer: BC Managed Care – PPO

## 2019-06-24 NOTE — Patient Instructions (Signed)
Your COVID test is scheduled Thursday 06/25/2019. Drive up in front of [place] any time between 8 am and 1 pm.  Your procedure is scheduled on: Friday 06/26/2019 Report to Same Day Surgery 2nd floor [facility] [address] Entr[facility]-take elevator on left to 2nd floor.  Check in with surgery information desk.) To find out your arrival time, call [phone] 1:00-3:00 PM on Thursday 06/25/2019  Remember: Instructions that are not followed completely may result in serious medical risk, up to and including death, or upon the discretion of your surgeon and anesthesiologist your surgery may need to be rescheduled.   __x__ 1. Do not eat food (including mints, candies, chewing gum) after midnight the night before your procedure. You may drink clear liquids up to 2 hours before you are scheduled to arrive at the hospital for your procedure.  Do not drink anything within 2 hours of your scheduled arrival to the hospital.  Approved clear liquids:  --Water or Apple juice without pulp  --Clear carbohydrate beverage such as Gatorade or Powerade  --Black Coffee or Clear Tea (No milk, no creamers, do not add anything to the coffee or tea)   __x__ 2. No Alcohol or smoking for 24 hours before or after surgery.  __x__ 3. Notify your doctor if there is any change in your medical condition (cold, fever, infections).  __x__ 5. On the morning of surgery brush your teeth with toothpaste and water.  You may rinse your mouth with mouthwash if you wish.  Do not swallow any toothpaste or mouthwash.  Please read over the following fact sheets that you were given: [facility] Preparing for Surgery   __x__ Use CHG Soap (provided in your bag) as directed on instruction sheet.  Do not wear jewelry, lotions, powders, deodorant, or perfumes on the day of surgery. Do not shave below the face/neck 48 hours prior to surgery.  Do not bring valuables to the hospital.  [facility] is not responsible for any belongings  or valuables.   Eyeglasses may not be worn into surgery.  Bring a plastic glasses case if possible. For patients discharged on the day of surgery, you will NOT be permitted to drive yourself home.  You must have a responsible adult with you for 24 hours after surgery.  __x__ Take these medicines on the morning of surgery with a SMALL SIP OF WATER:  1. Pantoprazole (Protonix)  2. Fexofenadine (Allegra) if needed  3. Flexeril, Hydrocodone, Tylenol if needed for pain  __x__ Use Albuterol inhaler on the day of surgery before arriving to hospital.  _n/s_ Follow recommendations from Cardiologist, Pulmonologist or PCP regarding stopping Aspirin, Coumadin, Plavix, Eliquis, Effient, Pradaxa, and Pletal.  __x__ STARTING TODAY: Do not take any anti-inflammatories such as Meloxicam, Mobic, Advil, Ibuprofen, Motrin, Aleve, Naproxen, Naprosyn, BC/Goodies powders or aspirin products. You may take Hydrocodone/Norco, Tylenol as needed.   __x__ STARTING TODAY: Do not take any over the counter vitamins or supplements until after surgery.  RN reviewed instructions with patient via preop phone call.  Patient verbalized understanding and will receive printed copy on date of COVID test.  __________________________ Phone call 06/24/2019 @ 8:19am

## 2019-06-25 ENCOUNTER — Other Ambulatory Visit: Payer: BC Managed Care – PPO

## 2019-06-25 ENCOUNTER — Other Ambulatory Visit
Admission: RE | Admit: 2019-06-25 | Discharge: 2019-06-25 | Disposition: A | Discharge status: Home / Self Care | Payer: BC Managed Care – PPO | Source: Ambulatory Visit | Attending: Urology | Admitting: Urology

## 2019-06-25 DIAGNOSIS — Z20822 Contact with and (suspected) exposure to covid-19: Secondary | ICD-10-CM | POA: Diagnosis not present

## 2019-06-25 DIAGNOSIS — Z01818 Encounter for other preprocedural examination: Secondary | ICD-10-CM | POA: Diagnosis present

## 2019-06-26 ENCOUNTER — Encounter
Admission: RE | Disposition: A | Discharge status: Home / Self Care | Payer: Self-pay | Source: Home / Self Care | Attending: Urology

## 2019-06-26 ENCOUNTER — Encounter: Payer: Self-pay | Admitting: Urology

## 2019-06-26 ENCOUNTER — Ambulatory Visit: Payer: BC Managed Care – PPO | Admitting: Anesthesiology

## 2019-06-26 ENCOUNTER — Other Ambulatory Visit: Payer: Self-pay

## 2019-06-26 ENCOUNTER — Ambulatory Visit
Admission: RE | Admit: 2019-06-26 | Discharge: 2019-06-26 | Disposition: A | Discharge status: Home / Self Care | Payer: BC Managed Care – PPO | Attending: Urology | Admitting: Urology

## 2019-06-26 DIAGNOSIS — N401 Enlarged prostate with lower urinary tract symptoms: Secondary | ICD-10-CM | POA: Insufficient documentation

## 2019-06-26 DIAGNOSIS — Z8042 Family history of malignant neoplasm of prostate: Secondary | ICD-10-CM | POA: Insufficient documentation

## 2019-06-26 DIAGNOSIS — N138 Other obstructive and reflux uropathy: Secondary | ICD-10-CM | POA: Insufficient documentation

## 2019-06-26 DIAGNOSIS — I2510 Atherosclerotic heart disease of native coronary artery without angina pectoris: Secondary | ICD-10-CM | POA: Diagnosis not present

## 2019-06-26 DIAGNOSIS — I10 Essential (primary) hypertension: Secondary | ICD-10-CM | POA: Insufficient documentation

## 2019-06-26 DIAGNOSIS — N320 Bladder-neck obstruction: Secondary | ICD-10-CM

## 2019-06-26 DIAGNOSIS — F17210 Nicotine dependence, cigarettes, uncomplicated: Secondary | ICD-10-CM | POA: Diagnosis not present

## 2019-06-26 DIAGNOSIS — M1990 Unspecified osteoarthritis, unspecified site: Secondary | ICD-10-CM | POA: Insufficient documentation

## 2019-06-26 HISTORY — PX: HOLEP-LASER ENUCLEATION OF THE PROSTATE WITH MORCELLATION: SHX6641

## 2019-06-26 LAB — SARS CORONAVIRUS 2 (TAT 6-24 HRS): SARS Coronavirus 2: NEGATIVE

## 2019-06-26 SURGERY — ENUCLEATION, PROSTATE, USING LASER, WITH MORCELLATION
Anesthesia: General | Site: Prostate

## 2019-06-26 MED ORDER — HYDROCODONE-ACETAMINOPHEN 5-325 MG PO TABS
ORAL_TABLET | ORAL | Status: AC
  Filled 2019-06-26: qty 1

## 2019-06-26 MED ORDER — ONDANSETRON HCL 4 MG/2ML IJ SOLN: 4.0000 mg | Freq: Once | INTRAMUSCULAR | Status: DC | PRN

## 2019-06-26 MED ORDER — CEFAZOLIN SODIUM-DEXTROSE 2-4 GM/100ML-% IV SOLN
2.0000 g | INTRAVENOUS | Status: AC
  Administered 2019-06-26: 2 g via INTRAVENOUS

## 2019-06-26 MED ORDER — LACTATED RINGERS IV SOLN: INTRAVENOUS | Status: DC

## 2019-06-26 MED ORDER — FENTANYL CITRATE (PF) 100 MCG/2ML IJ SOLN
INTRAMUSCULAR | Status: AC
  Administered 2019-06-26: 25 ug via INTRAVENOUS
  Filled 2019-06-26: qty 2

## 2019-06-26 MED ORDER — MIDAZOLAM HCL 2 MG/2ML IJ SOLN
INTRAMUSCULAR | Status: AC
  Filled 2019-06-26: qty 2

## 2019-06-26 MED ORDER — DIAZEPAM 5 MG PO TABS
5.0000 mg | ORAL_TABLET | Freq: Once | ORAL | Status: AC
  Administered 2019-06-26: 5 mg via ORAL

## 2019-06-26 MED ORDER — FENTANYL CITRATE (PF) 100 MCG/2ML IJ SOLN
INTRAMUSCULAR | Status: DC | PRN
  Administered 2019-06-26 (×2): 25 ug via INTRAVENOUS
  Administered 2019-06-26: 50 ug via INTRAVENOUS

## 2019-06-26 MED ORDER — OXYBUTYNIN CHLORIDE ER 10 MG PO TB24: 10.0000 mg | ORAL_TABLET | Freq: Every day | ORAL | 0 refills | Status: DC | PRN

## 2019-06-26 MED ORDER — OXYBUTYNIN CHLORIDE ER 10 MG PO TB24
10.0000 mg | ORAL_TABLET | Freq: Once | ORAL | Status: AC
  Administered 2019-06-26: 10 mg via ORAL
  Filled 2019-06-26: qty 1

## 2019-06-26 MED ORDER — PROPOFOL 10 MG/ML IV BOLUS
INTRAVENOUS | Status: AC
  Filled 2019-06-26: qty 20

## 2019-06-26 MED ORDER — SUCCINYLCHOLINE CHLORIDE 20 MG/ML IJ SOLN
INTRAMUSCULAR | Status: DC | PRN
  Administered 2019-06-26: 100 mg via INTRAVENOUS

## 2019-06-26 MED ORDER — MIDAZOLAM HCL 2 MG/2ML IJ SOLN
INTRAMUSCULAR | Status: DC | PRN
  Administered 2019-06-26: 2 mg via INTRAVENOUS

## 2019-06-26 MED ORDER — ONDANSETRON HCL 4 MG/2ML IJ SOLN
INTRAMUSCULAR | Status: DC | PRN
  Administered 2019-06-26: 4 mg via INTRAVENOUS

## 2019-06-26 MED ORDER — FENTANYL CITRATE (PF) 100 MCG/2ML IJ SOLN
INTRAMUSCULAR | Status: AC
  Filled 2019-06-26: qty 2

## 2019-06-26 MED ORDER — LIDOCAINE HCL (CARDIAC) PF 100 MG/5ML IV SOSY
PREFILLED_SYRINGE | INTRAVENOUS | Status: DC | PRN
  Administered 2019-06-26: 100 mg via INTRAVENOUS

## 2019-06-26 MED ORDER — KETOROLAC TROMETHAMINE 30 MG/ML IJ SOLN
15.0000 mg | Freq: Once | INTRAMUSCULAR | Status: AC
  Administered 2019-06-26: 15 mg via INTRAVENOUS

## 2019-06-26 MED ORDER — BELLADONNA ALKALOIDS-OPIUM 16.2-60 MG RE SUPP
RECTAL | Status: DC | PRN
  Administered 2019-06-26: 1 via RECTAL

## 2019-06-26 MED ORDER — HYDROCODONE-ACETAMINOPHEN 5-325 MG PO TABS: 1.0000 | ORAL_TABLET | ORAL | 0 refills | Status: AC | PRN

## 2019-06-26 MED ORDER — ROCURONIUM BROMIDE 100 MG/10ML IV SOLN
INTRAVENOUS | Status: DC | PRN
  Administered 2019-06-26: 40 mg via INTRAVENOUS
  Administered 2019-06-26: 10 mg via INTRAVENOUS

## 2019-06-26 MED ORDER — FENTANYL CITRATE (PF) 100 MCG/2ML IJ SOLN
25.0000 ug | INTRAMUSCULAR | Status: DC | PRN
  Administered 2019-06-26 (×3): 25 ug via INTRAVENOUS

## 2019-06-26 MED ORDER — SEVOFLURANE IN SOLN
RESPIRATORY_TRACT | Status: AC
  Filled 2019-06-26: qty 250

## 2019-06-26 MED ORDER — KETOROLAC TROMETHAMINE 30 MG/ML IJ SOLN
INTRAMUSCULAR | Status: AC
  Filled 2019-06-26: qty 1

## 2019-06-26 MED ORDER — PROPOFOL 10 MG/ML IV BOLUS
INTRAVENOUS | Status: DC | PRN
  Administered 2019-06-26: 150 mg via INTRAVENOUS

## 2019-06-26 MED ORDER — DIAZEPAM 5 MG PO TABS
ORAL_TABLET | ORAL | Status: AC
  Filled 2019-06-26: qty 1

## 2019-06-26 MED ORDER — HYDROCODONE-ACETAMINOPHEN 5-325 MG PO TABS
1.0000 | ORAL_TABLET | Freq: Once | ORAL | Status: AC
  Administered 2019-06-26: 1 via ORAL

## 2019-06-26 MED ORDER — HYDROCODONE-ACETAMINOPHEN 5-325 MG PO TABS
1.0000 | ORAL_TABLET | ORAL | Status: DC | PRN
  Administered 2019-06-26: 1 via ORAL

## 2019-06-26 MED ORDER — PHENYLEPHRINE HCL (PRESSORS) 10 MG/ML IV SOLN
INTRAVENOUS | Status: DC | PRN
  Administered 2019-06-26: 100 ug via INTRAVENOUS

## 2019-06-26 MED ORDER — DEXAMETHASONE SODIUM PHOSPHATE 10 MG/ML IJ SOLN
INTRAMUSCULAR | Status: DC | PRN
  Administered 2019-06-26: 10 mg via INTRAVENOUS

## 2019-06-26 MED ORDER — SUGAMMADEX SODIUM 200 MG/2ML IV SOLN
INTRAVENOUS | Status: DC | PRN
  Administered 2019-06-26: 170.6 mg via INTRAVENOUS

## 2019-06-26 MED ORDER — CEFAZOLIN SODIUM-DEXTROSE 2-4 GM/100ML-% IV SOLN
INTRAVENOUS | Status: AC
  Filled 2019-06-26: qty 100

## 2019-06-26 SURGICAL SUPPLY — 35 items
ADAPTER IRRIG TUBE 2 SPIKE SOL (ADAPTER) ×4 IMPLANT
ADPR TBG 2 SPK PMP STRL ASCP (ADAPTER) ×2
BAG DRN LRG CPC RND TRDRP CNTR (MISCELLANEOUS) ×1
BAG URO DRAIN 4000ML (MISCELLANEOUS) ×2 IMPLANT
CATH FOLEY 3WAY 30CC 24FR (CATHETERS)
CATH URETL 5X70 OPEN END (CATHETERS) ×2 IMPLANT
CATH URTH STD 24FR FL 3W 2 (CATHETERS) ×1 IMPLANT
CONTAINER COLLECT MORCELLATR (MISCELLANEOUS) ×1 IMPLANT
DRAPE UTILITY 15X26 TOWEL STRL (DRAPES) ×1 IMPLANT
ELECT BIVAP BIPO 22/24 DONUT (ELECTROSURGICAL)
ELECTRD BIVAP BIPO 22/24 DONUT (ELECTROSURGICAL) IMPLANT
FILTER OVERFLOW MORCELLATOR (FILTER) ×1 IMPLANT
GLOVE BIOGEL PI IND STRL 7.5 (GLOVE) ×1 IMPLANT
GLOVE BIOGEL PI INDICATOR 7.5 (GLOVE) ×1
GOWN STRL REUS W/ TWL LRG LVL3 (GOWN DISPOSABLE) ×1 IMPLANT
GOWN STRL REUS W/ TWL XL LVL3 (GOWN DISPOSABLE) ×1 IMPLANT
GOWN STRL REUS W/TWL LRG LVL3 (GOWN DISPOSABLE) ×2
GOWN STRL REUS W/TWL XL LVL3 (GOWN DISPOSABLE) ×2
HOLDER FOLEY CATH W/STRAP (MISCELLANEOUS) ×2 IMPLANT
KIT TURNOVER CYSTO (KITS) ×2 IMPLANT
LASER FIBER 550M SMARTSCOPE (Laser) ×2 IMPLANT
MORCELLATOR COLLECT CONTAINER (MISCELLANEOUS) ×2
MORCELLATOR OVERFLOW FILTER (FILTER) ×2
MORCELLATOR ROTATION 4.75 335 (MISCELLANEOUS) ×2 IMPLANT
PACK CYSTO AR (MISCELLANEOUS) ×2 IMPLANT
SET CYSTO W/LG BORE CLAMP LF (SET/KITS/TRAYS/PACK) ×2 IMPLANT
SET IRRIG Y TYPE TUR BLADDER L (SET/KITS/TRAYS/PACK) ×2 IMPLANT
SLEEVE PROTECTION STRL DISP (MISCELLANEOUS) ×4 IMPLANT
SOL .9 NS 3000ML IRR  AL (IV SOLUTION) ×8
SOL .9 NS 3000ML IRR AL (IV SOLUTION) ×4
SOL .9 NS 3000ML IRR UROMATIC (IV SOLUTION) ×4 IMPLANT
SURGILUBE 2OZ TUBE FLIPTOP (MISCELLANEOUS) ×2 IMPLANT
SYRINGE IRR TOOMEY STRL 70CC (SYRINGE) ×2 IMPLANT
TUBE PUMP MORCELLATOR PIRANHA (TUBING) ×2 IMPLANT
WATER STERILE IRR 1000ML POUR (IV SOLUTION) ×2 IMPLANT

## 2019-06-26 NOTE — Transfer of Care (Signed)
Immediate Anesthesia Transfer of Care Note  Patient: Joseph Larson  Procedure(s) Performed: HOLEP-LASER ENUCLEATION OF THE PROSTATE WITH MORCELLATION (N/A Prostate)  Patient Location: PACU  Anesthesia Type:General  Level of Consciousness: awake and oriented  Airway & Oxygen Therapy: Patient Spontanous Breathing and Patient connected to face mask oxygen  Post-op Assessment: Report given to RN and Post -op Vital signs reviewed and stable  Post vital signs: Reviewed and stable  Last Vitals:  Vitals Value Taken Time  BP 148/77 06/26/19 0847  Temp 36.2 C 06/26/19 0847  Pulse 53 06/26/19 0850  Resp 12 06/26/19 0851  SpO2 92 % 06/26/19 0850  Vitals shown include unvalidated device data.  Last Pain:  Vitals:   06/26/19 0620  TempSrc: Temporal  PainSc: 4          Complications: No apparent anesthesia complications

## 2019-06-26 NOTE — H&P (Signed)
06/26/19 7:00 AM   Joseph Larson 1956-01-02 [id number]  HPI: To briefly summarize he is a 64 year old healthy male who has a long history of bothersome urinary symptoms of primarily urinary frequency and nocturia.  He is previously been followed by our PA and trialed on numerous OAB medications without any improvement in his symptoms.  He recently underwent a cystoscopy with me that showed a large prostate with a large intravesical median lobe.  He has been on finasteride long-term with only minimal improvement in his urinary symptoms.  Recent PSA is 1.8, corrected to 3.6 for finasteride which is within the normal range.  We opted for urodynamics prior to pursuing a bladder outlet procedure in the setting of his primarily overactive symptoms.  I personally reviewed his urodynamic study.  Max capacity was 260 mL.  First sensation occurred at 104 mL.  Normal desire occurred at 153 mL.  Strong desire occurred at 254 mils.  The bladder was unstable with low amplitude instability with the first unstable contraction occurring at 253 mL.  He felt an increased urge at that time but was able to inhibit the contraction without leaking.  There was no stress incontinence.  He voided with an obstructed flow pattern with high detrusor pressures.  Max flow was 7 mL/s and detrusor pressure at peak flow was 57 cm of water.  He is elected to proceed with HOLEP for an outlet procedure.    PMH: Past Medical History:  Diagnosis Date  . Angina pectoris ([place])   . Anxiety   . Arthritis    osteoarthritis  . BPH (benign prostatic hyperplasia)   . Coronary atherosclerosis   . ED (erectile dysfunction)   . Elevated PSA   . Erectile dysfunction   . Esophageal spasm   . GERD (gastroesophageal reflux disease)   . Hyperlipidemia   . Hypertension   . Neuralgia   . Pancreatitis   . Rectus diastasis   . Rhinitis    chronic    Surgical History: Past Surgical History:  Procedure Laterality Date  .  arthroscopic shoulder Left   . CARDIAC CATHETERIZATION  2010  . CHOLECYSTECTOMY    . COLONOSCOPY WITH PROPOFOL N/A 07/01/2015   Procedure: COLONOSCOPY WITH PROPOFOL;  Surgeon: Manya Silvas, MD;  Location: [facility] ENDOSCOPY;  Service: Endoscopy;  Laterality: N/A;  . ESOPHAGOGASTRODUODENOSCOPY (EGD) WITH PROPOFOL N/A 07/01/2015   Procedure: ESOPHAGOGASTRODUODENOSCOPY (EGD) WITH PROPOFOL;  Surgeon: Manya Silvas, MD;  Location: [facility] ENDOSCOPY;  Service: Endoscopy;  Laterality: N/A;  . EUS  01/17/2012   Procedure: UPPER ENDOSCOPIC ULTRASOUND (EUS) LINEAR;  Surgeon: Milus Banister, MD;  Location: [facility] ENDOSCOPY;  Service: Endoscopy;  Laterality: N/A;  radial linear   . EXCISION MORTON'S NEUROMA     Left Foot  . HERNIA REPAIR     Right inguinal     Family History: Family History  Problem Relation Age of Onset  . Prostate cancer Brother   . Kidney disease Neg Hx   . Kidney cancer Neg Hx   . Bladder Cancer Neg Hx     Social History:  reports that he has been smoking cigarettes. He has a 20.00 pack-year smoking history. He has never used smokeless tobacco. He reports that he does not drink alcohol or use drugs.  Physical Exam: BP 131/72   Pulse 64   Temp 97.8 F (36.6 C) (Temporal)   Resp 16   Ht 6' (1.829 m)   Wt 85.3 kg   SpO2 98%  BMI 25.50 kg/m    Constitutional:  Alert and oriented, No acute distress. 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  He continues to be extremely bothered by his urinary symptoms and is interested in pursuing more definitive management.  With his objective demonstration of bladder outlet obstruction on urodynamics and large intravesical median lobe, I do think he would benefit from an outlet procedure.  With his large  intravesical lobe I think he would benefit from HOLEP or TURP much more than UroLift.  We discussed the risks and benefits of HoLEP at length.  The procedure requires general anesthesia and takes 2 to 3 hours, and a holmium laser is used to enucleate the prostate and push this tissue into the bladder.  A morcellator is then used to remove this tissue, which is sent for pathology.  The vast majority of patients are able to discharge the same day with a catheter in place for 2 to 3 days, and will follow-up in clinic for a voiding trial.  Approximately 5% of patients will be admitted overnight to monitor the urine, or if they have multiple co-morbidities.  We specifically discussed the risks of bleeding, infection, retrograde ejaculation, temporary urgency and urge incontinence, very low risk of long-term incontinence, pathologic evaluation of prostate tissue and possible detection of prostate cancer or other malignancy, and possible need for additional procedures.  HoLEP today  Nickolas Madrid, MD 06/26/2019  [facility] [address] [place], [place] [address] [phone]

## 2019-06-26 NOTE — OR Nursing (Signed)
Discussed patient advising "I'm not going home" with MD.  Bladder scan performed as requested by MD - 72ml.   Dr. Diamantina Providence in to see patient 1233pm.  Foley d/c'd by MD, advises pt will need to void prior to discharge.  Patient advises discomfort after foley removal still 8/10.

## 2019-06-26 NOTE — Anesthesia Postprocedure Evaluation (Signed)
Anesthesia Post Note  Patient: Joseph Larson  Procedure(s) Performed: HOLEP-LASER ENUCLEATION OF THE PROSTATE WITH MORCELLATION (N/A Prostate)  Patient location during evaluation: PACU Anesthesia Type: General Level of consciousness: awake and alert and oriented Pain management: pain level controlled Vital Signs Assessment: post-procedure vital signs reviewed and stable Respiratory status: spontaneous breathing, nonlabored ventilation and respiratory function stable Cardiovascular status: blood pressure returned to baseline and stable Postop Assessment: no signs of nausea or vomiting Anesthetic complications: no     Last Vitals:  Vitals:   06/26/19 0947 06/26/19 1010  BP: (!) 146/83 (!) 147/80  Pulse: 63 (!) 56  Resp: 13 16  Temp: (!) 36.2 C (!) 36.3 C  SpO2:  100%    Last Pain:  Vitals:   06/26/19 1010  TempSrc: Temporal  PainSc: 9                  Jayona Mccaig

## 2019-06-26 NOTE — Anesthesia Preprocedure Evaluation (Signed)
Anesthesia Evaluation  Patient identified by MRN, date of birth, ID band Patient awake    Reviewed: Allergy & Precautions, NPO status , Patient's Chart, lab work & pertinent test results  History of Anesthesia Complications Negative for: history of anesthetic complications  Airway Mallampati: III  TM Distance: <3 FB Neck ROM: Full    Dental  (+) Implants, Caps   Pulmonary neg sleep apnea, neg COPD, Current Smoker and Patient abstained from smoking.,    breath sounds clear to auscultation- rhonchi (-) wheezing      Cardiovascular hypertension, Pt. on medications + CAD  (-) Past MI, (-) Cardiac Stents and (-) CABG  Rhythm:Regular Rate:Normal - Systolic murmurs and - Diastolic murmurs    Neuro/Psych  Headaches, neg Seizures Anxiety    GI/Hepatic Neg liver ROS, GERD  ,  Endo/Other  negative endocrine ROSneg diabetes  Renal/GU negative Renal ROS     Musculoskeletal  (+) Arthritis ,   Abdominal (+) - obese,   Peds  Hematology negative hematology ROS (+)   Anesthesia Other Findings Past Medical History: No date: Angina pectoris ([place]) No date: Anxiety No date: Arthritis     Comment:  osteoarthritis No date: BPH (benign prostatic hyperplasia) No date: Coronary atherosclerosis No date: ED (erectile dysfunction) No date: Elevated PSA No date: Erectile dysfunction No date: Esophageal spasm No date: GERD (gastroesophageal reflux disease) No date: Hyperlipidemia No date: Hypertension No date: Neuralgia No date: Pancreatitis No date: Rectus diastasis No date: Rhinitis     Comment:  chronic   Reproductive/Obstetrics                            Anesthesia Physical Anesthesia Plan  ASA: III  Anesthesia Plan: General   Post-op Pain Management:    Induction: Intravenous  PONV Risk Score and Plan: 0 and Ondansetron  Airway Management Planned: Oral ETT and Video Laryngoscope  Planned  Additional Equipment:   Intra-op Plan:   Post-operative Plan: Extubation in OR  Informed Consent: I have reviewed the patients History and Physical, chart, labs and discussed the procedure including the risks, benefits and alternatives for the proposed anesthesia with the patient or authorized representative who has indicated his/her understanding and acceptance.     Dental advisory given  Plan Discussed with: CRNA and Anesthesiologist  Anesthesia Plan Comments:         Anesthesia Quick Evaluation

## 2019-06-26 NOTE — Anesthesia Procedure Notes (Signed)
Procedure Name: Intubation Date/Time: 06/26/2019 7:45 AM Performed by: Nelda Marseille, CRNA Pre-anesthesia Checklist: Patient identified, Patient being monitored, Timeout performed, Emergency Drugs available and Suction available Patient Re-evaluated:Patient Re-evaluated prior to induction Oxygen Delivery Method: Circle system utilized Preoxygenation: Pre-oxygenation with 100% oxygen Induction Type: IV induction Ventilation: Mask ventilation without difficulty Laryngoscope Size: Mac, 3 and Glidescope Grade View: Grade III Tube type: Oral Tube size: 7.5 mm Number of attempts: 1 Airway Equipment and Method: Stylet and Video-laryngoscopy Placement Confirmation: ETT inserted through vocal cords under direct vision,  positive ETCO2 and breath sounds checked- equal and bilateral Secured at: 21 cm Tube secured with: Tape Dental Injury: Teeth and Oropharynx as per pre-operative assessment  Difficulty Due To: Difficult Airway- due to anterior larynx and Difficulty was anticipated

## 2019-06-26 NOTE — Progress Notes (Signed)
Continues to complain of bladder pain/pressure in PACU  Abdomen soft, non-tender, non-distended. Bladder scan <86mL.  Took 25cc out of balloon and he continued to complain of severe pain and pressure for another hour.  We discussed options including foley removal and the risks and benefits, including possible need for replacement. He yunderstands the risks and elects foley removal.  Urine faint pink, foley removed. Will continue to monitor and have him void prior to discharge.  Nickolas Madrid, MD 06/26/2019

## 2019-06-26 NOTE — Op Note (Signed)
Date of procedure: 06/26/19  Preoperative diagnosis:  1. BPH with outlet obstruction  Postoperative diagnosis:  1. Same  Procedure: 1. HoLEP (Holmium Laser Enucleation of the Prostate)  Surgeon: Nickolas Madrid, MD  Anesthesia: General  Complications: None  Intraoperative findings:  1.  Moderate size prostate with high bladder neck and median lobe 2.  Uncomplicated HOLEP, excellent hemostasis, ureteral orifice ease and Vero intact at conclusion of case  EBL: Minimal  Specimens: Prostate chips  Enucleation time: 31 minutes  Morcellation time: 3 minutes  Intra-op weight: 28 g  Drains: 24 French three-way, 50 cc in balloon  Indication: Joseph Larson is a 64 y.o. patient with long history of urinary symptoms and bladder outlet obstruction with high pressure low flow voiding on urodynamics who elected HOLEP for outlet procedure.  After reviewing the management options for treatment, they elected to proceed with the above surgical procedure(s). We have discussed the potential benefits and risks of the procedure, side effects of the proposed treatment, the likelihood of the patient achieving the goals of the procedure, and any potential problems that might occur during the procedure or recuperation.  We specifically discussed the risks of bleeding, infection, hematuria and clot retention, need for additional procedures, possible overnight hospital stay, temporary urgency and incontinence, rare long-term incontinence, and retrograde ejaculation.  Informed consent has been obtained.   Description of procedure:  The patient was taken to the operating room and general anesthesia was induced.  The patient was placed in the dorsal lithotomy position, prepped and draped in the usual sterile fashion, and preoperative antibiotics were administered.  SCDs were placed for DVT prophylaxis.  A preoperative time-out was performed.   The 48 French continuous flow resectoscope was inserted into the  urethra using the visual obturator  The prostate was moderate in size with a high bladder neck and median lobe. The bladder was thoroughly inspected and notable for mild to moderate bladder trabeculations.  The ureteral orifices were located in orthotopic position.  The laser was set to 2 J and 50 Hz and was used to make an incision at the 5 and 7 o'clock position to the level of the capsule from the bladder neck to the verumontanum, and the median lobe was enucleated into the bladder.  The lateral lobes were then incised circumferentially, starting on the right side, until they were disconnected from the surrounding tissue.  The capsule was examined and laser was used for meticulous hemostasis.    The 76 French resectoscope was then switched out for the 45 French nephroscope and the lobes were morcellated and the tissue sent to pathology.  A 24 French three-way catheter was inserted easily, and CBI was initiated.  50 cc were placed in the balloon.  Urine was clear.  The catheter irrigated easily with a [place] syringe.  A belladonna suppository was placed.  The patient tolerated the procedure well without any immediate complications and was extubated and transferred to the recovery room in stable condition.  Urine was clear on fast CBI.  Disposition: Stable to PACU  Plan: Wean CBI in PACU, anticipate discharge home today with void trial in clinic in 2-3 days  Nickolas Madrid, MD 06/26/2019

## 2019-06-26 NOTE — Discharge Instructions (Signed)

## 2019-06-26 NOTE — OR Nursing (Addendum)
Dr. Diamantina Providence in to see pt 1052 am - removed 25cc from foley balloon, advises 25cc remaining in balloon.  Will send 30cc syringe home with patient in case foley needs to be removed over the weekend at home.   If not, will see pt. In office Monday to remove.

## 2019-06-29 ENCOUNTER — Telehealth: Payer: Self-pay | Admitting: Urology

## 2019-06-29 ENCOUNTER — Ambulatory Visit (INDEPENDENT_AMBULATORY_CARE_PROVIDER_SITE_OTHER): Payer: BC Managed Care – PPO | Admitting: Physician Assistant

## 2019-06-29 ENCOUNTER — Other Ambulatory Visit: Payer: Self-pay

## 2019-06-29 VITALS — BP 135/73 | HR 69 | Ht 70.0 in | Wt 188.0 lb

## 2019-06-29 DIAGNOSIS — R301 Vesical tenesmus: Secondary | ICD-10-CM | POA: Diagnosis not present

## 2019-06-29 DIAGNOSIS — N138 Other obstructive and reflux uropathy: Secondary | ICD-10-CM | POA: Diagnosis not present

## 2019-06-29 DIAGNOSIS — R338 Other retention of urine: Secondary | ICD-10-CM | POA: Diagnosis not present

## 2019-06-29 DIAGNOSIS — N401 Enlarged prostate with lower urinary tract symptoms: Secondary | ICD-10-CM

## 2019-06-29 LAB — URINALYSIS, COMPLETE
Bilirubin, UA: NEGATIVE
Ketones, UA: NEGATIVE
Nitrite, UA: NEGATIVE
Specific Gravity, UA: 1.02 (ref 1.005–1.030)
Urobilinogen, Ur: 0.2 mg/dL (ref 0.2–1.0)
pH, UA: 6 (ref 5.0–7.5)

## 2019-06-29 LAB — BLADDER SCAN AMB NON-IMAGING: Scan Result: 584

## 2019-06-29 LAB — MICROSCOPIC EXAMINATION
Bacteria, UA: NONE SEEN
RBC, Urine: >30 /hpf — AB (ref 0–2)

## 2019-06-29 LAB — SURGICAL PATHOLOGY

## 2019-06-29 MED ORDER — OXYBUTYNIN CHLORIDE ER 10 MG PO TB24: 10.0000 mg | ORAL_TABLET | Freq: Every day | ORAL | 0 refills | Status: AC | PRN

## 2019-06-29 NOTE — Telephone Encounter (Signed)
Pt. In extreme Pain and has no pain medication. Pt. States he is very uncomfortable and is urinating every 15 minutes nonstop. Pt. Would like for someone to call him to discuss these symptoms and advise him on making an appt.

## 2019-06-29 NOTE — Progress Notes (Signed)
Simple Catheter Placement  Due to urinary retention patient is present today for a foley cath placement.  Patient was cleaned and prepped in a sterile fashion with betadine and 2% lidocaine jelly was instilled into the urethra. A 16 FR foley catheter was inserted, urine return was noted  521ml, urine was amber in color.  The balloon was filled with 10cc of sterile water.  A leg bag was attached for drainage. Patient was also given a night bag to take home and was given instruction on how to change from one bag to another.  Patient was given instruction on proper catheter care.  Patient tolerated well, no complications were noted   Performed by: Debroah Loop, PA-C

## 2019-06-29 NOTE — Patient Instructions (Signed)
Indwelling Urinary Catheter Care, Adult An indwelling urinary catheter is a thin, flexible, germ-free (sterile) tube that is placed into the bladder to help drain urine out of the body. The catheter is inserted into the part of the body that drains urine from the bladder (urethra). Urine drains from the catheter into a drainage bag outside of the body. Taking good care of your catheter will keep it working properly and help to prevent problems from developing. What are the risks?  Bacteria may get into your bladder and cause a urinary tract infection.  Urine flow can become blocked. This can happen if the catheter is not working correctly, or if you have sediment or a blood clot in your bladder or the catheter.  Tissue near the catheter may become irritated and bleed. How to wear your catheter and your drainage bag Supplies needed  Adhesive tape or a leg strap.  Alcohol wipe or soap and water (if you use tape).  A clean towel (if you use tape).  Overnight drainage bag.  Smaller drainage bag (leg bag). Wearing your catheter and bag Use adhesive tape or a leg strap to attach your catheter to your leg.  Make sure the catheter is not pulled tight.  If a leg strap gets wet, replace it with a dry one.  If you use adhesive tape: 1. Use an alcohol wipe or soap and water to wash off any stickiness on your skin where you had tape before. 2. Use a clean towel to pat-dry the area. 3. Apply the new tape. You should have received a large overnight drainage bag and a smaller leg bag that fits underneath clothing.  You may wear the overnight bag at any time, but you should not wear the leg bag at night.  Always wear the leg bag below your knee.  Make sure the overnight drainage bag is always lower than the level of your bladder, but do not let it touch the floor. Before you go to sleep, hang the bag inside a wastebasket that is covered by a clean plastic bag. How to care for your skin around  the catheter     Supplies needed  A clean washcloth.  Water and mild soap.  A clean towel. Caring for your skin and catheter  Every day, use a clean washcloth and soapy water to clean the skin around your catheter. 1. Wash your hands with soap and water. 2. Wet a washcloth in warm water and mild soap. 3. Clean the skin around your urethra.  If you are male:  Use one hand to gently spread the folds of skin around your vagina (labia).  With the washcloth in your other hand, wipe the inner side of your labia on each side. Do this in a front-to-back direction.  If you are male:  Use one hand to pull back any skin that covers the end of your penis (foreskin).  With the washcloth in your other hand, wipe your penis in small circles. Start wiping at the tip of your penis, then move outward from the catheter.  Move the foreskin back in place, if this applies. 4. With your free hand, hold the catheter close to where it enters your body. Keep holding the catheter during cleaning so it does not get pulled out. 5. Use your other hand to clean the catheter with the washcloth.  Only wipe downward on the catheter.  Do not wipe upward toward your body, because that may push bacteria into your urethra  and cause infection. 6. Use a clean towel to pat-dry the catheter and the skin around it. Make sure to wipe off all soap. 7. Wash your hands with soap and water.  Shower every day. Do not take baths.  Do not use cream, ointment, or lotion on the area where the catheter enters your body, unless your health care provider tells you to do that.  Do not use powders, sprays, or lotions on your genital area.  Check your skin around the catheter every day for signs of infection. Check for: ? Redness, swelling, or pain. ? Fluid or blood. ? Warmth. ? Pus or a bad smell. How to empty the drainage bag Supplies needed  Rubbing alcohol.  Gauze pad or cotton ball.  Adhesive tape or a leg  strap. Emptying the bag Empty your drainage bag (your overnight drainage bag or your leg bag) when it is ?- full, or at least 2-3 times a day. Clean the drainage bag according to the manufacturer's instructions or as told by your health care provider. 1. Wash your hands with soap and water. 2. Detach the drainage bag from your leg. 3. Hold the drainage bag over the toilet or a clean container. Make sure the drainage bag is lower than your hips and bladder. This stops urine from going back into the tubing and into your bladder. 4. Open the pour spout at the bottom of the bag. 5. Empty the urine into the toilet or container. Do not let the pour spout touch any surface. This precaution is important to prevent bacteria from getting in the bag and causing infection. 6. Apply rubbing alcohol to a gauze pad or cotton ball. 7. Use the gauze pad or cotton ball to clean the pour spout. 8. Close the pour spout. 9. Attach the bag to your leg with adhesive tape or a leg strap. 10. Wash your hands with soap and water. How to change the drainage bag Supplies needed:  Alcohol wipes.  A clean drainage bag.  Adhesive tape or a leg strap. Changing the bag Replace your drainage bag with a clean bag if it leaks, starts to smell bad, or looks dirty. 1. Wash your hands with soap and water. 2. Detach the dirty drainage bag from your leg. 3. Pinch the catheter with your fingers so that urine does not spill out. 4. Disconnect the catheter tube from the drainage tube at the connection valve. Do not let the tubes touch any surface. 5. Clean the end of the catheter tube with an alcohol wipe. Use a different alcohol wipe to clean the end of the drainage tube. 6. Connect the catheter tube to the drainage tube of the clean bag. 7. Attach the clean bag to your leg with adhesive tape or a leg strap. Avoid attaching the new bag too tightly. 8. Wash your hands with soap and water. General instructions   Never pull on  your catheter or try to remove it. Pulling can damage your internal tissues.  Always wash your hands before and after you handle your catheter or drainage bag. Use a mild, fragrance-free soap. If soap and water are not available, use hand sanitizer.  Always make sure there are no twists or bends (kinks) in the catheter tube.  Always make sure there are no leaks in the catheter or drainage bag.  Drink enough fluid to keep your urine pale yellow.  Do not take baths, swim, or use a hot tub.  If you are male, wipe from  front to back after having a bowel movement. Contact a health care provider if:  Your urine is cloudy.  Your urine smells unusually bad.  Your catheter gets clogged.  Your catheter starts to leak.  Your bladder feels full. Get help right away if:  You have redness, swelling, or pain where the catheter enters your body.  You have fluid, blood, pus, or a bad smell coming from the area where the catheter enters your body.  The area where the catheter enters your body feels warm to the touch.  You have a fever.  You have pain in your abdomen, legs, lower back, or bladder.  You see blood in the catheter.  Your urine is pink or red.  You have nausea, vomiting, or chills.  Your urine is not draining into the bag.  Your catheter gets pulled out. Summary  An indwelling urinary catheter is a thin, flexible, germ-free (sterile) tube that is placed into the bladder to help drain urine out of the body.  The catheter is inserted into the part of the body that drains urine from the bladder (urethra).  Take good care of your catheter to keep it working properly and help prevent problems from developing.  Always wash your hands before and after you handle your catheter or drainage bag.  Never pull on your catheter or try to remove it. This information is not intended to replace advice given to you by your health care provider. Make sure you discuss any questions  you have with your health care provider. Document Revised: 07/04/2018 Document Reviewed: 10/26/2016 Elsevier Patient Education  [employer].

## 2019-06-29 NOTE — Telephone Encounter (Signed)
Called patient to discuss symptoms. Pain and frequency persistent post HOLEP. Denies other symptoms. Made appointment to be evaluated today. Voiced understanding, pain worsens go to ER.

## 2019-06-29 NOTE — Progress Notes (Signed)
06/29/2019 2:27 PM   [name] 1956/01/09 [id number]  CC: Frequency, urgency, dysuria, abdominal pain, back pain  HPI: Joseph Larson is a 64 y.o. male who presents today for evaluation of bothersome urinary symptoms including frequency, urgency, dysuria, abdominal pain, and back pain. He is POD 3 from HOLEP with Dr. Diamantina Providence. Postoperative period was complicated by poor tolerance of Foley catheterization. Patient elected for Foley catheter removal prior to discharge on POD 0.  Today, patient reports bothersome urinary symptoms as described above. He has been taking oxybutynin daily as prescribed. He has had bowel movements since surgery.   In-office UA today positive for 3+ blood, 1+ protein, and 1+ leukocyte esterase; urine microscopy with 11-30 WBCs/HPF and>30 RBCs/HPF. PVR 59mL.  PMH: Past Medical History:  Diagnosis Date  . Angina pectoris ([place])   . Anxiety   . Arthritis    osteoarthritis  . BPH (benign prostatic hyperplasia)   . Coronary atherosclerosis   . ED (erectile dysfunction)   . Elevated PSA   . Erectile dysfunction   . Esophageal spasm   . GERD (gastroesophageal reflux disease)   . Hyperlipidemia   . Hypertension   . Neuralgia   . Pancreatitis   . Rectus diastasis   . Rhinitis    chronic    Surgical History: Past Surgical History:  Procedure Laterality Date  . arthroscopic shoulder Left   . CARDIAC CATHETERIZATION  2010  . CHOLECYSTECTOMY    . COLONOSCOPY WITH PROPOFOL N/A 07/01/2015   Procedure: COLONOSCOPY WITH PROPOFOL;  Surgeon: Manya Silvas, MD;  Location: [facility] ENDOSCOPY;  Service: Endoscopy;  Laterality: N/A;  . ESOPHAGOGASTRODUODENOSCOPY (EGD) WITH PROPOFOL N/A 07/01/2015   Procedure: ESOPHAGOGASTRODUODENOSCOPY (EGD) WITH PROPOFOL;  Surgeon: Manya Silvas, MD;  Location: [facility] ENDOSCOPY;  Service: Endoscopy;  Laterality: N/A;  . EUS  01/17/2012   Procedure: UPPER ENDOSCOPIC ULTRASOUND (EUS) LINEAR;  Surgeon: Milus Banister, MD;   Location: [facility] ENDOSCOPY;  Service: Endoscopy;  Laterality: N/A;  radial linear   . EXCISION MORTON'S NEUROMA     Left Foot  . HERNIA REPAIR     Right inguinal  . HOLEP-LASER ENUCLEATION OF THE PROSTATE WITH MORCELLATION N/A 06/26/2019   Procedure: HOLEP-LASER ENUCLEATION OF THE PROSTATE WITH MORCELLATION;  Surgeon: Billey Co, MD;  Location: [facility] ORS;  Service: Urology;  Laterality: N/A;    Home Medications:  Allergies as of 06/29/2019   No Known Allergies     Medication List       Accurate as of June 29, 2019  2:27 PM. If you have any questions, ask your nurse or doctor.        acetaminophen 500 MG tablet Commonly known as: TYLENOL Take 1,000 mg by mouth every 6 (six) hours as needed for moderate pain or headache.   albuterol 108 (90 Base) MCG/ACT inhaler Commonly known as: VENTOLIN HFA Inhale 2 puffs into the lungs every 6 (six) hours as needed for wheezing or shortness of breath.   alprostadil 10 MCG injection Commonly known as: Edex 10 mcg by Intracavitary route as needed for erectile dysfunction. use no more than 3 times per week   cyclobenzaprine 10 MG tablet Commonly known as: FLEXERIL Take 10 mg by mouth daily as needed for muscle spasms.   fexofenadine 180 MG tablet Commonly known as: ALLEGRA Take 180 mg by mouth daily as needed for allergies or rhinitis.   HYDROcodone-acetaminophen 5-325 MG tablet Commonly known as: NORCO/VICODIN Take 1 tablet by mouth every 6 (six) hours  as needed for moderate pain.   HYDROcodone-acetaminophen 5-325 MG tablet Commonly known as: NORCO/VICODIN Take 1 tablet by mouth every 4 (four) hours as needed for up to 3 days for moderate pain.   ibuprofen 200 MG tablet Commonly known as: ADVIL Take 400 mg by mouth every 6 (six) hours as needed for headache or moderate pain.   MAGNESIUM PO Take 1 tablet by mouth 2 (two) times a week.   Melatonin 10 MG Caps Take 10 mg by mouth at bedtime.   meloxicam 15 MG tablet Commonly  known as: MOBIC Take 15 mg by mouth every evening.   oxybutynin 10 MG 24 hr tablet Commonly known as: Ditropan XL Take 1 tablet (10 mg total) by mouth daily as needed for up to 3 days (for bladder pressure/spasm).   pantoprazole 40 MG tablet Commonly known as: PROTONIX Take 40 mg by mouth See admin instructions. Take 40 mg in the evening, may take a second 40 mg dose as needed for acid reflux       Allergies:  No Known Allergies  Family History: Family History  Problem Relation Age of Onset  . Prostate cancer Brother   . Kidney disease Neg Hx   . Kidney cancer Neg Hx   . Bladder Cancer Neg Hx     Social History:   reports that he has been smoking cigarettes. He has a 20.00 pack-year smoking history. He has never used smokeless tobacco. He reports that he does not drink alcohol or use drugs.  Physical Exam: BP 135/73   Pulse 69   Ht 5\' 10"  (1.778 m)   Wt 188 lb (85.3 kg)   BMI 26.98 kg/m   Constitutional:  Alert and oriented, in acute distress, nontoxic appearing HEENT: [place], AT Cardiovascular: No clubbing, cyanosis, or edema Respiratory: Normal respiratory effort, no increased work of breathing GI: Distended abdomen with suprapubic tenderness Skin: No rashes, bruises or suspicious lesions Neurologic: Grossly intact, no focal deficits, moving all 4 extremities Psychiatric: Normal mood and affect  Laboratory Data: Results for orders placed or performed in visit on 06/29/19  Bladder Scan (Post Void Residual) in office  Result Value Ref Range   Scan Result 584    Assessment & Plan:   1. Acute urinary retention 64 year old male POD 3 from HOLEP presents in acute urinary retention. UA notable for pyuria and microscopic hematuria, unclear if this represents normal postoperative changes or acute UTI. Will send urine for culture for further evaluation.  I had a lengthy conversation with the patient and his wife today. I explained that he is in acute urinary retention and  that I recommend replacement of Foley catheterization at this time for bladder decompression and to allow the bladder to heal from his stretch injury. Ultimately, patient agreed. See separate procedure note for details.   Patient noted to have significant relief in abdominal pain following bladder decompression today. He appears to be tolerating Foley catheter well. We will plan for voiding trial in 1 week. - Urinalysis, Complete - Bladder Scan (Post Void Residual) in office - CULTURE, URINE COMPREHENSIVE  2. Painful bladder spasm Counseled patient to take oxybutynin only as needed for management of bladder spasms. Explained that oxybutynin may worsen urinary retention. He expressed understanding. - oxybutynin (DITROPAN XL) 10 MG 24 hr tablet; Take 1 tablet (10 mg total) by mouth daily as needed for up to 5 days (for bladder pressure/spasm).  Dispense: 3 tablet; Refill: 0   Return in about 1 week (around 07/06/2019)  for Voiding trial.  Debroah Loop, PA-C  [facility] [address], [place], [place] [address] [phone]

## 2019-07-01 LAB — CULTURE, URINE COMPREHENSIVE

## 2019-07-01 NOTE — Telephone Encounter (Signed)
-----   Message from Billey Co, MD sent at 07/01/2019  8:14 AM EDT ----- No prostate cancer seen on HOLEP specimen, good news, keep follow up as scheduled  Nickolas Madrid, MD 07/01/2019

## 2019-07-01 NOTE — Telephone Encounter (Signed)
Patient notified

## 2019-07-06 ENCOUNTER — Other Ambulatory Visit: Payer: Self-pay

## 2019-07-06 ENCOUNTER — Ambulatory Visit: Payer: BC Managed Care – PPO | Admitting: Physician Assistant

## 2019-07-06 ENCOUNTER — Ambulatory Visit (INDEPENDENT_AMBULATORY_CARE_PROVIDER_SITE_OTHER): Payer: BC Managed Care – PPO | Admitting: Physician Assistant

## 2019-07-06 ENCOUNTER — Encounter: Payer: Self-pay | Admitting: Physician Assistant

## 2019-07-06 VITALS — BP 160/78 | HR 60 | Ht 72.0 in | Wt 185.0 lb

## 2019-07-06 DIAGNOSIS — N401 Enlarged prostate with lower urinary tract symptoms: Secondary | ICD-10-CM

## 2019-07-06 DIAGNOSIS — N138 Other obstructive and reflux uropathy: Secondary | ICD-10-CM

## 2019-07-06 NOTE — Progress Notes (Signed)
Fill and Pull Catheter Removal  Patient is present today for a catheter removal.  Patient was cleaned and prepped in a sterile fashion 152ml of sterile saline was instilled into the bladder when the patient felt the urge to urinate. 46ml of water was then drained from the balloon.  A 16FR foley cath was removed from the bladder no complications were noted .  Patient was then given some time to void on their own.  Patient can void  135ml on their own after some time.  Patient tolerated well.  Performed by: Debroah Loop, PA-C   Additional notes:  -Voiding trial passed. Do not need to return to clinic this afternoon for PVR. Counseled patient to contact our office if he develops inability to urinate, lower abdominal pain, distention. He expressed understanding. -Shared negative pathology results -Instructed on kegel exercises, counseled pt to start these 3x10 daily to help with leakage. Provided written instructions.  Follow-up: Return in about 5 weeks (around 08/10/2019) for Postop f/u with Dr. Diamantina Providence.

## 2019-07-06 NOTE — Patient Instructions (Signed)

## 2019-07-07 ENCOUNTER — Other Ambulatory Visit: Payer: Self-pay

## 2019-07-07 ENCOUNTER — Telehealth: Payer: Self-pay

## 2019-07-07 ENCOUNTER — Ambulatory Visit
Admission: RE | Admit: 2019-07-07 | Discharge: 2019-07-07 | Disposition: A | Discharge status: Home / Self Care | Payer: BC Managed Care – PPO | Source: Ambulatory Visit | Attending: Specialist | Admitting: Specialist

## 2019-07-07 DIAGNOSIS — R911 Solitary pulmonary nodule: Secondary | ICD-10-CM | POA: Diagnosis not present

## 2019-07-07 NOTE — Telephone Encounter (Signed)
-----   Message from Billey Co, MD sent at 07/06/2019  4:54 PM EDT ----- Doristine Devoid news, only benign prostate tissue seen on HOLEP path, no prostate cancer. Keep follow up as scheduled  Nickolas Madrid, MD 07/06/2019

## 2019-07-07 NOTE — Telephone Encounter (Signed)
Per documentation pt informed of negative pathology 07/06/2019 by Sam.

## 2019-07-15 ENCOUNTER — Other Ambulatory Visit: Payer: Self-pay | Admitting: Specialist

## 2019-07-15 DIAGNOSIS — R0600 Dyspnea, unspecified: Secondary | ICD-10-CM

## 2019-07-15 DIAGNOSIS — J849 Interstitial pulmonary disease, unspecified: Secondary | ICD-10-CM

## 2019-07-15 DIAGNOSIS — R0609 Other forms of dyspnea: Secondary | ICD-10-CM

## 2019-08-03 ENCOUNTER — Encounter: Payer: Self-pay | Admitting: Urology

## 2019-08-03 ENCOUNTER — Ambulatory Visit (INDEPENDENT_AMBULATORY_CARE_PROVIDER_SITE_OTHER): Payer: BC Managed Care – PPO | Admitting: Urology

## 2019-08-03 ENCOUNTER — Other Ambulatory Visit: Payer: Self-pay

## 2019-08-03 VITALS — BP 151/77 | HR 65 | Ht 72.0 in | Wt 188.0 lb

## 2019-08-03 DIAGNOSIS — N138 Other obstructive and reflux uropathy: Secondary | ICD-10-CM | POA: Diagnosis not present

## 2019-08-03 DIAGNOSIS — N401 Enlarged prostate with lower urinary tract symptoms: Secondary | ICD-10-CM

## 2019-08-03 DIAGNOSIS — N390 Urinary tract infection, site not specified: Secondary | ICD-10-CM

## 2019-08-03 MED ORDER — SULFAMETHOXAZOLE-TRIMETHOPRIM 800-160 MG PO TABS: 1.0000 | ORAL_TABLET | Freq: Two times a day (BID) | ORAL | 0 refills | Status: DC

## 2019-08-03 MED ORDER — TADALAFIL 20 MG PO TABS: 20.0000 mg | ORAL_TABLET | Freq: Every day | ORAL | 11 refills | Status: DC | PRN

## 2019-08-03 NOTE — Patient Instructions (Signed)
Tadalafil tablets (Cialis) What is this medicine? TADALAFIL (tah DA la fil) is used to treat erection problems in men. It is also used for enlargement of the prostate gland in men, a condition called benign prostatic hyperplasia or BPH. This medicine improves urine flow and reduces BPH symptoms. This medicine can also treat both erection problems and BPH when they occur together. This medicine may be used for other purposes; ask your health care provider or pharmacist if you have questions. COMMON BRAND NAME(S): Kathaleen Bury, Cialis What should I tell my health care provider before I take this medicine? They need to know if you have any of these conditions:  bleeding disorders  eye or vision problems, including a rare inherited eye disease called retinitis pigmentosa  anatomical deformation of the penis, Peyronie's disease, or history of priapism (painful and prolonged erection)  heart disease, angina, a history of heart attack, irregular heart beats, or other heart problems  high or low blood pressure  history of blood diseases, like sickle cell anemia or leukemia  history of stomach bleeding  kidney disease  liver disease  stroke  an unusual or allergic reaction to tadalafil, other medicines, foods, dyes, or preservatives  pregnant or trying to get pregnant  breast-feeding How should I use this medicine? Take this medicine by mouth with a glass of water. Follow the directions on the prescription label. You may take this medicine with or without meals. When this medicine is used for erection problems, your doctor may prescribe it to be taken once daily or as needed. If you are taking the medicine as needed, you may be able to have sexual activity 30 minutes after taking it and for up to 36 hours after taking it. Whether you are taking the medicine as needed or once daily, you should not take more than one dose per day. If you are taking this medicine for symptoms of benign  prostatic hyperplasia (BPH) or to treat both BPH and an erection problem, take the dose once daily at about the same time each day. Do not take your medicine more often than directed. Talk to your pediatrician regarding the use of this medicine in children. Special care may be needed. Overdosage: If you think you have taken too much of this medicine contact a poison control center or emergency room at once. NOTE: This medicine is only for you. Do not share this medicine with others. What if I miss a dose? If you are taking this medicine as needed for erection problems, this does not apply. If you miss a dose while taking this medicine once daily for an erection problem, benign prostatic hyperplasia, or both, take it as soon as you remember, but do not take more than one dose per day. What may interact with this medicine? Do not take this medicine with any of the following medications:  nitrates like amyl nitrite, isosorbide dinitrate, isosorbide mononitrate, nitroglycerin  other medicines for erectile dysfunction like avanafil, sildenafil, vardenafil  other tadalafil products (Adcirca)  riociguat This medicine may also interact with the following medications:  certain drugs for high blood pressure  certain drugs for the treatment of HIV infection or AIDS  certain drugs used for fungal or yeast infections, like fluconazole, itraconazole, ketoconazole, and voriconazole  certain drugs used for seizures like carbamazepine, phenytoin, and phenobarbital  grapefruit juice  macrolide antibiotics like clarithromycin, erythromycin, troleandomycin  medicines for prostate problems  rifabutin, rifampin or rifapentine This list may not describe all possible interactions. Give your  health care provider a list of all the medicines, herbs, non-prescription drugs, or dietary supplements you use. Also tell them if you smoke, drink alcohol, or use illegal drugs. Some items may interact with your  medicine. What should I watch for while using this medicine? If you notice any changes in your vision while taking this drug, call your doctor or health care professional as soon as possible. Stop using this medicine and call your health care provider right away if you have a loss of sight in one or both eyes. Contact your doctor or health care professional right away if the erection lasts longer than 4 hours or if it becomes painful. This may be a sign of serious problem and must be treated right away to prevent permanent damage. If you experience symptoms of nausea, dizziness, chest pain or arm pain upon initiation of sexual activity after taking this medicine, you should refrain from further activity and call your doctor or health care professional as soon as possible. Do not drink alcohol to excess (examples, 5 glasses of wine or 5 shots of whiskey) when taking this medicine. When taken in excess, alcohol can increase your chances of getting a headache or getting dizzy, increasing your heart rate or lowering your blood pressure. Using this medicine does not protect you or your partner against HIV infection (the virus that causes AIDS) or other sexually transmitted diseases. What side effects may I notice from receiving this medicine? Side effects that you should report to your doctor or health care professional as soon as possible:  allergic reactions like skin rash, itching or hives, swelling of the face, lips, or tongue  breathing problems  changes in hearing  changes in vision  chest pain  fast, irregular heartbeat  prolonged or painful erection  seizures Side effects that usually do not require medical attention (report to your doctor or health care professional if they continue or are bothersome):  back pain  dizziness  flushing  headache  indigestion  muscle aches  nausea  stuffy or runny nose This list may not describe all possible side effects. Call your doctor  for medical advice about side effects. You may report side effects to FDA at 1-800-FDA-1088. Where should I keep my medicine? Keep out of the reach of children. Store at room temperature between 15 and 30 degrees C (59 and 86 degrees F). Throw away any unused medicine after the expiration date. NOTE: This sheet is a summary. It may not cover all possible information. If you have questions about this medicine, talk to your doctor, pharmacist, or health care provider.  2020 Elsevier/Gold Standard (2013-07-31 13:15:49)   Kegel Exercises  Kegel exercises can help strengthen your pelvic floor muscles. The pelvic floor is a group of muscles that support your rectum, small intestine, and bladder. In females, pelvic floor muscles also help support the womb (uterus). These muscles help you control the flow of urine and stool. Kegel exercises are painless and simple, and they do not require any equipment. Your provider may suggest Kegel exercises to:  Improve bladder and bowel control.  Improve sexual response.  Improve weak pelvic floor muscles after surgery to remove the uterus (hysterectomy) or pregnancy (females).  Improve weak pelvic floor muscles after prostate gland removal or surgery (males). Kegel exercises involve squeezing your pelvic floor muscles, which are the same muscles you squeeze when you try to stop the flow of urine or keep from passing gas. The exercises can be done while sitting, standing,  or lying down, but it is best to vary your position. Exercises How to do Kegel exercises: 1. Squeeze your pelvic floor muscles tight. You should feel a tight lift in your rectal area. If you are a male, you should also feel a tightness in your vaginal area. Keep your stomach, buttocks, and legs relaxed. 2. Hold the muscles tight for up to 10 seconds. 3. Breathe normally. 4. Relax your muscles. 5. Repeat as told by your health care provider. Repeat this exercise daily as told by your  health care provider. Continue to do this exercise for at least 4-6 weeks, or for as long as told by your health care provider. You may be referred to a physical therapist who can help you learn more about how to do Kegel exercises. Depending on your condition, your health care provider may recommend:  Varying how long you squeeze your muscles.  Doing several sets of exercises every day.  Doing exercises for several weeks.  Making Kegel exercises a part of your regular exercise routine. This information is not intended to replace advice given to you by your health care provider. Make sure you discuss any questions you have with your health care provider. Document Revised: 10/30/2017 Document Reviewed: 10/30/2017 Elsevier Patient Education  [place].

## 2019-08-03 NOTE — Progress Notes (Signed)
   08/03/2019 11:20 AM   [name] Dec 10, 1955 [id number]  Reason for visit: Follow up   HPI: I saw Mr. Harkleroad today and urology clinic for 5-week follow-up after undergoing a HOLEP.  His primary urinary symptoms preop were bothersome urinary frequency and nocturia.  Urodynamics demonstrated bladder outlet obstruction and he opted for HOLEP.  He underwent HOLEP on 06/26/2019 with removal of 41 g of tissue.  His postop course was complicated by severe pain in the PACU requiring catheter removal, and ultimately redeveloped retention requiring a catheter in clinic later that week.    He reports he is overall doing well and has improved since before surgery.  He is voiding with a strong stream, PVR is low at 17 mL today.  He does have some dysuria and occasional hematuria, as well as some persistent stress incontinence requiring depends.  A urinalysis was checked last week by his PCP and did ultimately grow > 100 K Klebsiella.  He was not treated with antibiotics for this.  I provided reassurance regarding his postop course and that I expect his stress incontinence to continue to improve.  I recommended a 10-day course of culture appropriate Bactrim for his acute UTI which I anticipate will improve his persistent mild dysuria and hematuria.  Kegel exercises encouraged.  Bactrim DS x10 days for UTI RTC 3 months with PVR  Billey Co, MD  [facility] [address], [place], [place] [address] [phone]

## 2019-11-04 ENCOUNTER — Encounter: Payer: Self-pay | Admitting: Urology

## 2019-11-04 ENCOUNTER — Other Ambulatory Visit: Payer: Self-pay

## 2019-11-04 ENCOUNTER — Ambulatory Visit (INDEPENDENT_AMBULATORY_CARE_PROVIDER_SITE_OTHER): Payer: BC Managed Care – PPO | Admitting: Urology

## 2019-11-04 VITALS — BP 152/79 | HR 70 | Ht 72.0 in | Wt 185.0 lb

## 2019-11-04 DIAGNOSIS — N138 Other obstructive and reflux uropathy: Secondary | ICD-10-CM

## 2019-11-04 DIAGNOSIS — N401 Enlarged prostate with lower urinary tract symptoms: Secondary | ICD-10-CM

## 2019-11-04 DIAGNOSIS — N529 Male erectile dysfunction, unspecified: Secondary | ICD-10-CM

## 2019-11-04 LAB — BLADDER SCAN AMB NON-IMAGING: Scan Result: 0

## 2019-11-04 MED ORDER — SILDENAFIL CITRATE 100 MG PO TABS: 100.0000 mg | ORAL_TABLET | Freq: Every day | ORAL | 11 refills | Status: DC | PRN

## 2019-11-04 NOTE — Patient Instructions (Signed)
Order a Audiological scientist incontinence clamp from [employer] to use while working out and prevent leakage  Continue with Kegel exercises, trying to hold the contraction for 10 seconds   Kegel Exercises  Kegel exercises can help strengthen your pelvic floor muscles. The pelvic floor is a group of muscles that support your rectum, small intestine, and bladder. In females, pelvic floor muscles also help support the womb (uterus). These muscles help you control the flow of urine and stool. Kegel exercises are painless and simple, and they do not require any equipment. Your provider may suggest Kegel exercises to:  Improve bladder and bowel control.  Improve sexual response.  Improve weak pelvic floor muscles after surgery to remove the uterus (hysterectomy) or pregnancy (females).  Improve weak pelvic floor muscles after prostate gland removal or surgery (males). Kegel exercises involve squeezing your pelvic floor muscles, which are the same muscles you squeeze when you try to stop the flow of urine or keep from passing gas. The exercises can be done while sitting, standing, or lying down, but it is best to vary your position. Exercises How to do Kegel exercises: 1. Squeeze your pelvic floor muscles tight. You should feel a tight lift in your rectal area. If you are a male, you should also feel a tightness in your vaginal area. Keep your stomach, buttocks, and legs relaxed. 2. Hold the muscles tight for up to 10 seconds. 3. Breathe normally. 4. Relax your muscles. 5. Repeat as told by your health care provider. Repeat this exercise daily as told by your health care provider. Continue to do this exercise for at least 4-6 weeks, or for as long as told by your health care provider. You may be referred to a physical therapist who can help you learn more about how to do Kegel exercises. Depending on your condition, your health care provider may recommend:  Varying how long you squeeze your muscles.  Doing  several sets of exercises every day.  Doing exercises for several weeks.  Making Kegel exercises a part of your regular exercise routine. This information is not intended to replace advice given to you by your health care provider. Make sure you discuss any questions you have with your health care provider. Document Revised: 10/30/2017 Document Reviewed: 10/30/2017 Elsevier Patient Education  [place].

## 2019-11-04 NOTE — Progress Notes (Signed)
   11/04/2019 10:52 AM   Joseph Larson 1955-08-20 [id number]  Reason for visit: Follow up BPH s/p HoLEP, ED  HPI: I saw Joseph Larson today and urology clinic for follow-up after undergoing a HOLEP.  His primary urinary symptoms pre-op were bothersome urinary frequency and nocturia.  Urodynamics demonstrated bladder outlet obstruction and he opted for HOLEP.  He underwent HOLEP on 06/26/2019 with removal of 41 g of tissue.  His postop course was complicated by severe pain in the PACU requiring catheter removal, and ultimately redeveloped retention requiring a catheter in clinic later that week.  He also had a Klebsiella UTI postop that was ultimately treated with 10 days of Bactrim.  He reports he continues to improve from a  urinary perspective.  He denies any nocturia or urgency/frequency during the day.  He continues to have some bothersome stress incontinence when he does CrossFit and works out, requiring a depends.  He also will have leakage when he hears running water.  He has not been consistent with Kegel exercises postop.  PVR is normal at 0 mL today.  I encouraged him to avoid bladder irritants, try Cunningham clamp during exercise, and continue Kegel exercises, being sure to hold the contraction for at least 10 seconds.  Could consider referral to pelvic floor physical therapy if not improving over the next few months, but I counseled him on  expectations after HOLEP.  Regarding his erectile dysfunction, he noted no improvement on 20 mg of Cialis daily.  He previously was started on alprostadil erections by [facility], he reports he never consistently performed the penile injections as he has neck pain that prevents him from looking down to perform the injection and his wife was unwilling to perform the injections.  We discussed his ED again at length today and I recommended a trial of 100 mg sildenafil and the risks and benefits were discussed.  He has hesitant to try injections  again.  We also briefly discussed other options including vacuum erection device or penile prosthesis, and information was given today.  Trial of sildenafil 100 mg on demand for ED RTC 3 months for symptom check on ED, and IPSS for [place], MD  [facility] [address], [place], [place] [address] [phone]

## 2020-01-08 ENCOUNTER — Ambulatory Visit: Payer: Medicare Other

## 2020-01-18 ENCOUNTER — Other Ambulatory Visit: Payer: Self-pay

## 2020-01-18 ENCOUNTER — Ambulatory Visit
Admission: RE | Admit: 2020-01-18 | Discharge: 2020-01-18 | Disposition: A | Discharge status: Home / Self Care | Payer: Medicare Other | Source: Ambulatory Visit | Attending: Specialist | Admitting: Specialist

## 2020-01-18 DIAGNOSIS — J849 Interstitial pulmonary disease, unspecified: Secondary | ICD-10-CM

## 2020-01-18 DIAGNOSIS — R0600 Dyspnea, unspecified: Secondary | ICD-10-CM | POA: Diagnosis present

## 2020-01-18 DIAGNOSIS — R0609 Other forms of dyspnea: Secondary | ICD-10-CM

## 2020-02-03 ENCOUNTER — Ambulatory Visit: Payer: Self-pay | Admitting: Urology

## 2020-02-04 ENCOUNTER — Encounter: Payer: Self-pay | Admitting: Urology

## 2020-02-17 ENCOUNTER — Other Ambulatory Visit: Payer: Self-pay | Admitting: Specialist

## 2020-02-17 ENCOUNTER — Other Ambulatory Visit (HOSPITAL_COMMUNITY): Payer: Self-pay | Admitting: Specialist

## 2020-02-17 ENCOUNTER — Encounter: Payer: Self-pay | Admitting: Urology

## 2020-02-17 ENCOUNTER — Ambulatory Visit (INDEPENDENT_AMBULATORY_CARE_PROVIDER_SITE_OTHER): Payer: Medicare Other | Admitting: Urology

## 2020-02-17 ENCOUNTER — Other Ambulatory Visit: Payer: Self-pay

## 2020-02-17 VITALS — BP 163/80 | HR 67 | Ht 71.0 in | Wt 187.0 lb

## 2020-02-17 DIAGNOSIS — N138 Other obstructive and reflux uropathy: Secondary | ICD-10-CM | POA: Diagnosis not present

## 2020-02-17 DIAGNOSIS — N401 Enlarged prostate with lower urinary tract symptoms: Secondary | ICD-10-CM

## 2020-02-17 DIAGNOSIS — N529 Male erectile dysfunction, unspecified: Secondary | ICD-10-CM

## 2020-02-17 DIAGNOSIS — R911 Solitary pulmonary nodule: Secondary | ICD-10-CM

## 2020-02-17 LAB — BLADDER SCAN AMB NON-IMAGING

## 2020-02-17 NOTE — Progress Notes (Signed)
   02/17/2020 5:08 PM   Joseph Larson 04-Jul-1955 [id number]  Reason for visit: Follow up ED, BPH status post HOLEP  HPI: I saw [name] back in urology clinic for follow-up of the above issues.  He is a 64 year old male who underwent an uncomplicated HOLEP on 10/28/5013 with removal of 41 g of tissue.  He reports he is doing very well from a urinary perspective and is voiding with a strong stream and really denies any significant urinary complaints today.  IPSS score is 2, and PVR is 5 mL.  His only issue is that he has urinary urgency when he hears running water.  I recommended urinating prior to doing the dishes or being around running water.  He otherwise does well and does fine overnight.  He also continues to have difficulty with erections.  He has tried numerous strategies before including PDE 5 inhibitors max dosages without significant improvement, as well as penile injections.  Secondary to neck pain he is unable to perform the penile injections himself.  He is unsure if he would like to pursue further strategies for ED.  We discussed options at length including repeat trial of penile injections or referral for consideration of a penile prosthesis.  He would like to talk with his wife first and get back to [place] on how he would like to proceed regarding treatment of his ED.  Patient will call to let us know if he would like to proceed with the ED treatments.  Billey Co, [facility] [address], [place], [place] [address] [phone]

## 2020-07-19 ENCOUNTER — Ambulatory Visit: Admission: RE | Admit: 2020-07-19 | Payer: Medicare Other | Source: Ambulatory Visit

## 2020-07-20 ENCOUNTER — Other Ambulatory Visit: Payer: Self-pay

## 2020-07-20 ENCOUNTER — Ambulatory Visit
Admission: RE | Admit: 2020-07-20 | Discharge: 2020-07-20 | Disposition: A | Discharge status: Home / Self Care | Payer: Medicare Other | Source: Ambulatory Visit | Attending: Specialist | Admitting: Specialist

## 2020-07-20 DIAGNOSIS — R911 Solitary pulmonary nodule: Secondary | ICD-10-CM

## 2020-07-30 IMAGING — CT CT CHEST W/O CM
2 of 4 series · 15 of 36 positions shown, 18 images · non-contrast
Comparison: 12/23/2018

CLINICAL DATA: Lung nodule.

EXAM:
CT CHEST WITHOUT CONTRAST
TECHNIQUE: Multidetector CT imaging of the chest was performed following the
standard protocol without IV contrast.

[Series 2: chest 2.00 · axial · 0.79mm/px · z∈[-1211,-889]mm · 12 of 191 slices shown, 15 images]
[im 15/191  mediastinal]
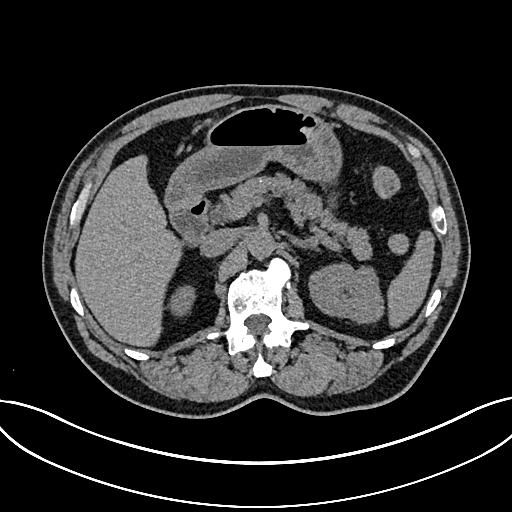
[im 15/191  lung]
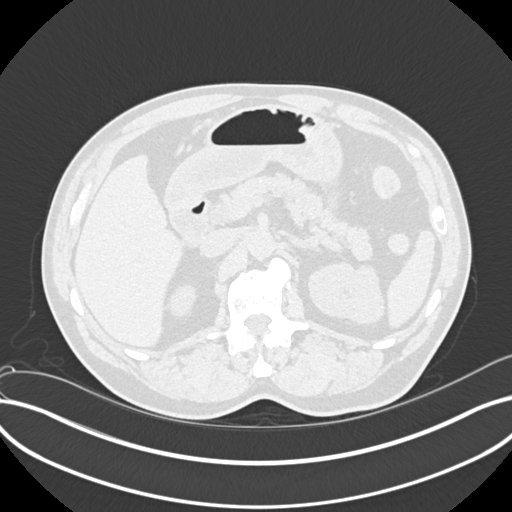
[im 30/191  lung]
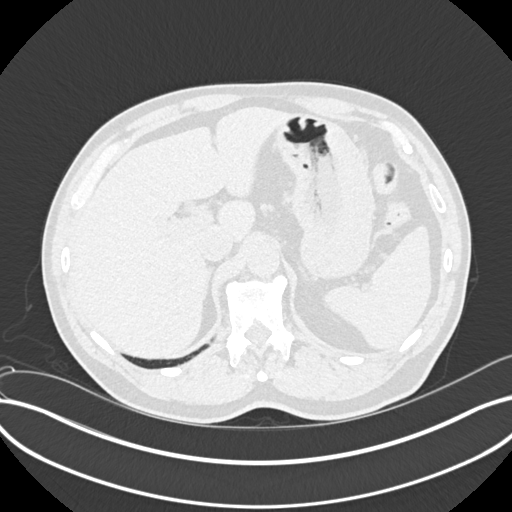
[im 44/191  lung]
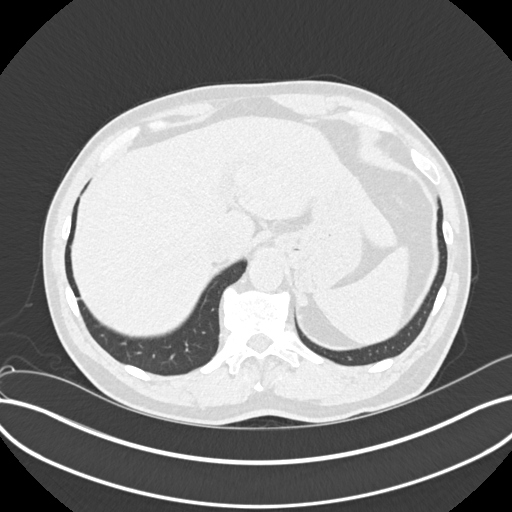
[im 59/191  lung]
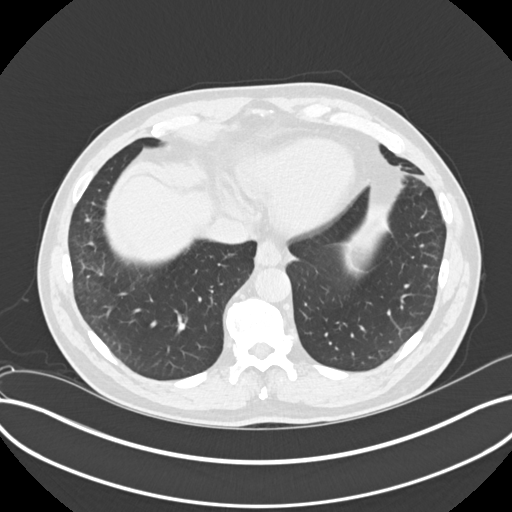
[im 74/191  mediastinal]
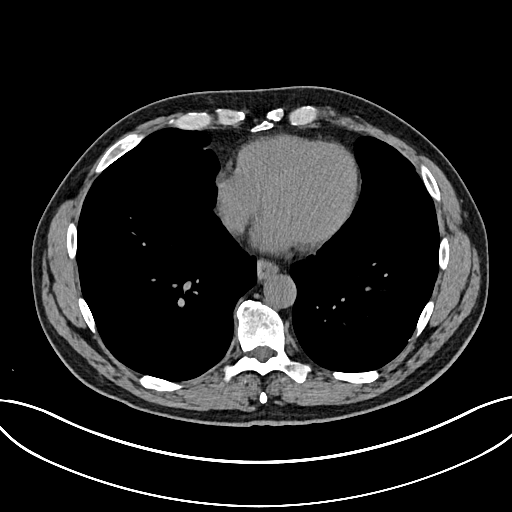
[im 74/191  lung]
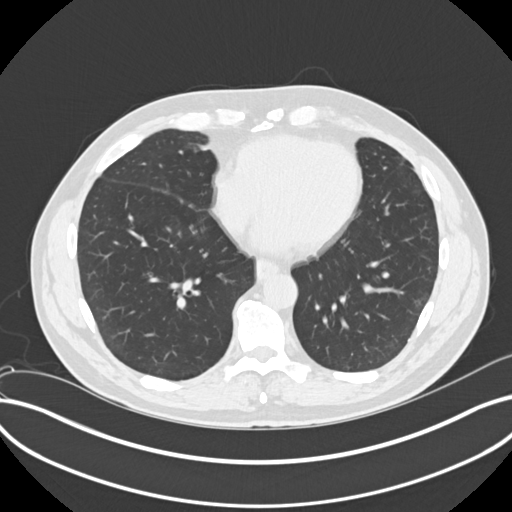
[im 88/191  lung]
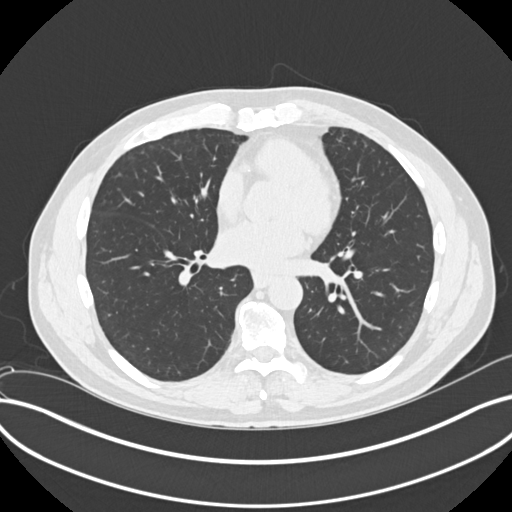
[im 103/191  lung]
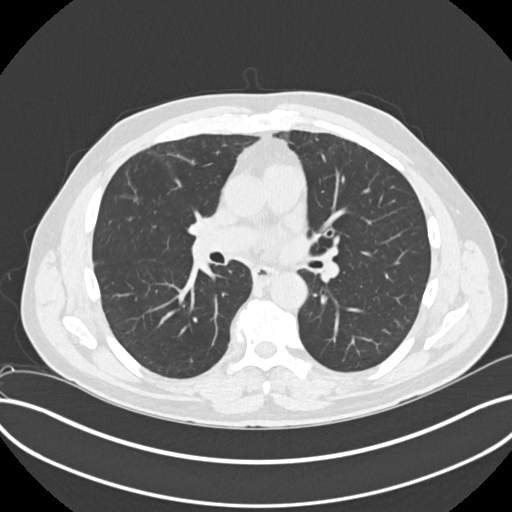
[im 117/191  lung]
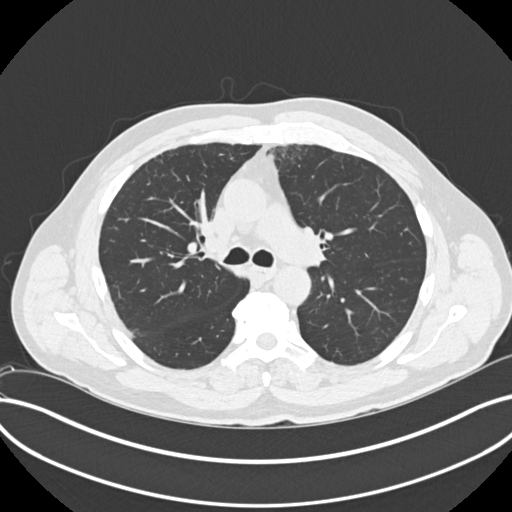
[im 132/191  mediastinal]
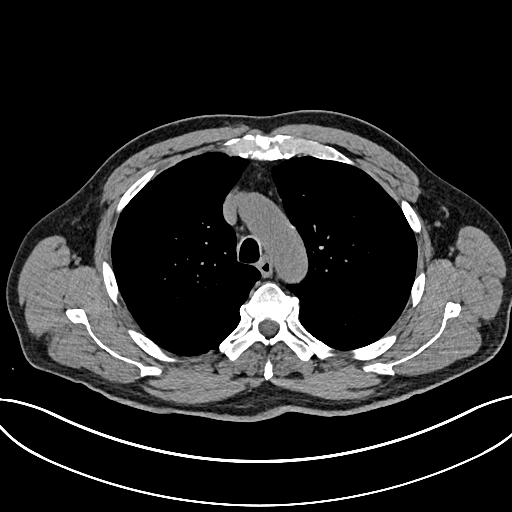
[im 132/191  lung]
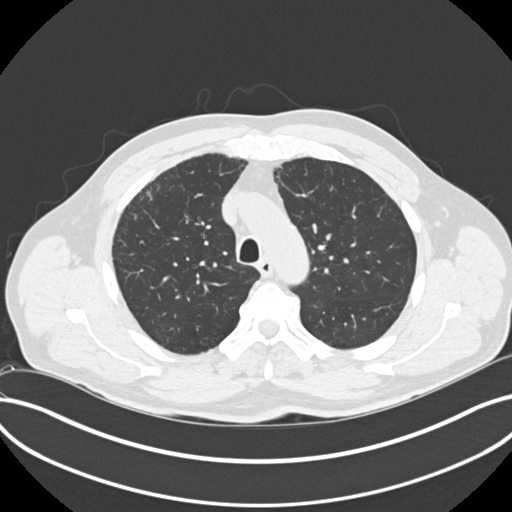
[im 147/191  lung]
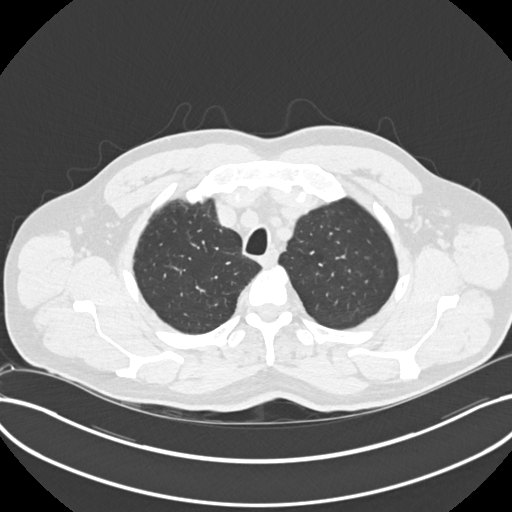
[im 161/191  lung]
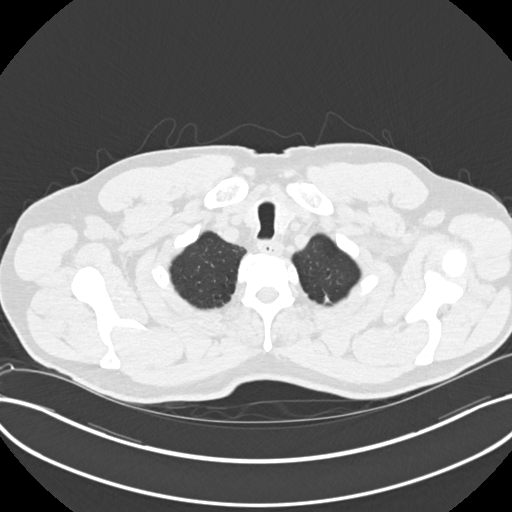
[im 176/191  lung]
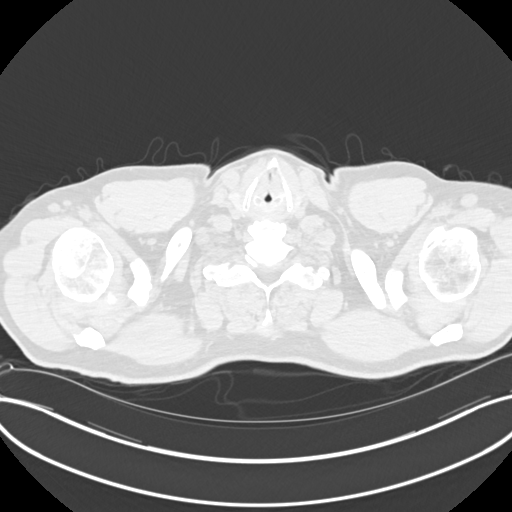

[Series 5: coronals chest 2.00 cor · coronal · 0.75mm/px · 3 of 169 slices shown]
[im 34/169  lung]
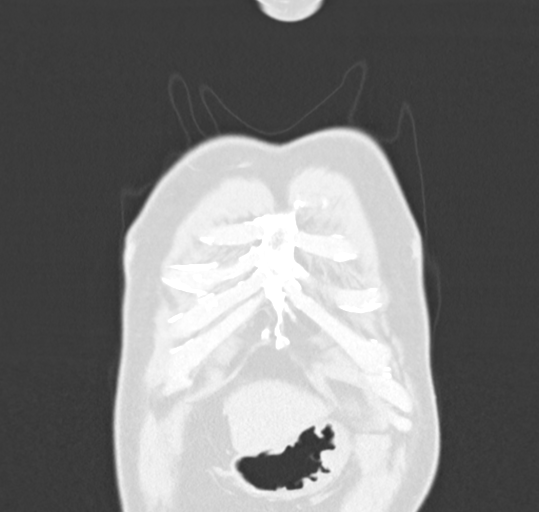
[im 68/169  lung]
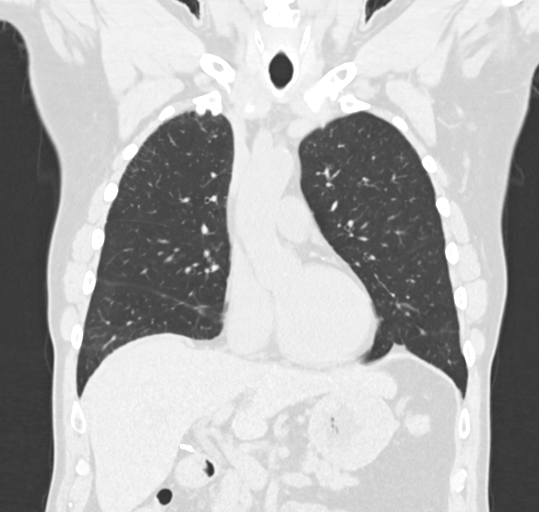
[im 101/169  lung]
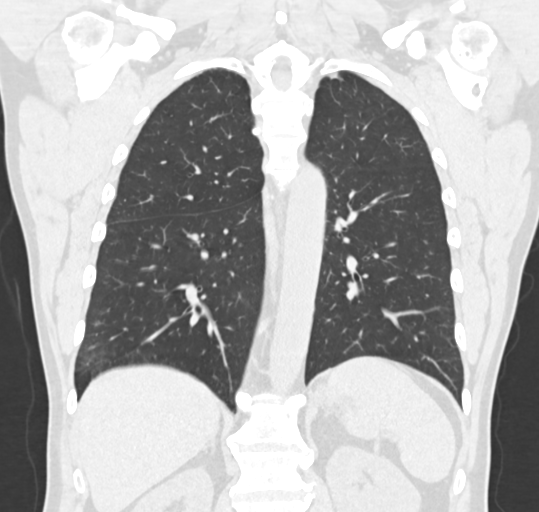

[15 of 36 positions shown; findings below may reference images not displayed]

FINDINGS: Cardiovascular: Calcified atherosclerotic changes, minimal in the
thoracic aorta. No evidence of aneurysmal dilation. Three-vessel
scattered coronary artery calcifications. No pericardial effusion.
Heart size is normal.

Mediastinum/Nodes: Thoracic inlet structures are unremarkable. No
axillary lymphadenopathy. No mediastinal lymphadenopathy. Hilar
structures with limited assessment. No gross adenopathy. Esophagus
mildly patulous.

Lungs/Pleura: Subpleural reticulation in the chest. Small
juxtapleural area of nodularity in the anterior right chest with
predominantly ground-glass features measuring approximately 7 x 8 mm
(image 54, series 3) not changed since the prior study. Less
solid-appearing than on the study October 06, 2018.

Other tiny foci of subpleural reticulation and nodularity are not
changed. Airways are patent. No consolidation or pleural effusion.

Upper Abdomen: Post cholecystectomy. Normal appearance of the
adrenal glands. Kidneys are incompletely imaged as are remaining
abdominal viscera.

Musculoskeletal: No acute or destructive bone process with extensive
spinal degenerative change.
IMPRESSION: 1. Small juxtapleural area of nodularity in the anterior right chest
with predominantly ground-glass features measuring approximately 7 x
8 mm. Less solid-appearing than on the study October 06, 2018. Other
tiny foci of subpleural reticulation and nodularity are not changed.
Consider repeat exam in 6-12 months to establish stability.
2. Three-vessel coronary artery calcifications.
3. Aortic atherosclerosis.

Aortic Atherosclerosis ([id number]-0FE.E).

## 2021-06-07 ENCOUNTER — Other Ambulatory Visit: Payer: Self-pay | Admitting: Internal Medicine

## 2021-06-07 DIAGNOSIS — R911 Solitary pulmonary nodule: Secondary | ICD-10-CM

## 2021-07-07 ENCOUNTER — Ambulatory Visit
Admission: RE | Admit: 2021-07-07 | Discharge: 2021-07-07 | Disposition: A | Discharge status: Home / Self Care | Payer: Medicare Other | Source: Ambulatory Visit | Attending: Internal Medicine | Admitting: Internal Medicine

## 2021-07-07 DIAGNOSIS — R911 Solitary pulmonary nodule: Secondary | ICD-10-CM

## 2021-07-17 ENCOUNTER — Other Ambulatory Visit: Payer: Self-pay | Admitting: Specialist

## 2021-07-17 DIAGNOSIS — R911 Solitary pulmonary nodule: Secondary | ICD-10-CM

## 2021-07-17 DIAGNOSIS — R0609 Other forms of dyspnea: Secondary | ICD-10-CM

## 2021-09-18 ENCOUNTER — Ambulatory Visit: Payer: Medicare Other

## 2021-09-18 ENCOUNTER — Other Ambulatory Visit: Payer: Self-pay | Admitting: Internal Medicine

## 2021-09-18 DIAGNOSIS — R55 Syncope and collapse: Secondary | ICD-10-CM

## 2021-09-27 ENCOUNTER — Inpatient Hospital Stay: Admission: RE | Admit: 2021-09-27 | Payer: Medicare Other | Source: Ambulatory Visit

## 2021-10-02 ENCOUNTER — Ambulatory Visit
Admission: RE | Admit: 2021-10-02 | Discharge: 2021-10-02 | Disposition: A | Discharge status: Home / Self Care | Payer: Medicare Other | Source: Ambulatory Visit | Attending: Specialist | Admitting: Specialist

## 2021-10-02 DIAGNOSIS — R911 Solitary pulmonary nodule: Secondary | ICD-10-CM

## 2021-10-02 DIAGNOSIS — R0609 Other forms of dyspnea: Secondary | ICD-10-CM

## 2021-10-04 ENCOUNTER — Ambulatory Visit
Admission: RE | Admit: 2021-10-04 | Discharge: 2021-10-04 | Disposition: A | Discharge status: Home / Self Care | Payer: Medicare Other | Source: Ambulatory Visit | Attending: Internal Medicine | Admitting: Internal Medicine

## 2021-10-04 DIAGNOSIS — R55 Syncope and collapse: Secondary | ICD-10-CM

## 2021-10-11 ENCOUNTER — Other Ambulatory Visit: Payer: Self-pay | Admitting: Specialist

## 2021-10-11 DIAGNOSIS — J449 Chronic obstructive pulmonary disease, unspecified: Secondary | ICD-10-CM

## 2021-10-11 DIAGNOSIS — R911 Solitary pulmonary nodule: Secondary | ICD-10-CM

## 2021-10-19 ENCOUNTER — Ambulatory Visit
Admission: RE | Admit: 2021-10-19 | Discharge: 2021-10-19 | Disposition: A | Discharge status: Home / Self Care | Payer: Medicare Other | Source: Ambulatory Visit | Attending: Specialist | Admitting: Specialist

## 2021-10-19 DIAGNOSIS — K5730 Diverticulosis of large intestine without perforation or abscess without bleeding: Secondary | ICD-10-CM | POA: Diagnosis not present

## 2021-10-19 DIAGNOSIS — I700 Atherosclerosis of aorta: Secondary | ICD-10-CM | POA: Diagnosis not present

## 2021-10-19 DIAGNOSIS — K4090 Unilateral inguinal hernia, without obstruction or gangrene, not specified as recurrent: Secondary | ICD-10-CM | POA: Insufficient documentation

## 2021-10-19 DIAGNOSIS — R911 Solitary pulmonary nodule: Secondary | ICD-10-CM | POA: Insufficient documentation

## 2021-10-19 DIAGNOSIS — M47899 Other spondylosis, site unspecified: Secondary | ICD-10-CM | POA: Insufficient documentation

## 2021-10-19 DIAGNOSIS — J449 Chronic obstructive pulmonary disease, unspecified: Secondary | ICD-10-CM | POA: Diagnosis not present

## 2021-10-19 DIAGNOSIS — D381 Neoplasm of uncertain behavior of trachea, bronchus and lung: Secondary | ICD-10-CM | POA: Diagnosis not present

## 2021-10-19 DIAGNOSIS — E119 Type 2 diabetes mellitus without complications: Secondary | ICD-10-CM | POA: Diagnosis not present

## 2021-10-19 LAB — GLUCOSE, CAPILLARY: Glucose-Capillary: 108 mg/dL — ABNORMAL HIGH (ref 70–99)

## 2021-10-19 MED ORDER — FLUDEOXYGLUCOSE F - 18 (FDG) INJECTION
9.7000 | Freq: Once | INTRAVENOUS | Status: AC | PRN
  Administered 2021-10-19: 10.48 via INTRAVENOUS

## 2021-11-09 ENCOUNTER — Encounter: Payer: Self-pay | Admitting: Thoracic Surgery (Cardiothoracic Vascular Surgery)

## 2021-11-09 ENCOUNTER — Institutional Professional Consult (permissible substitution): Payer: Medicare Other | Admitting: Thoracic Surgery (Cardiothoracic Vascular Surgery)

## 2021-11-09 VITALS — BP 147/76 | HR 57 | Resp 20 | Ht 71.0 in | Wt 195.2 lb

## 2021-11-09 DIAGNOSIS — R911 Solitary pulmonary nodule: Secondary | ICD-10-CM | POA: Diagnosis not present

## 2021-11-09 NOTE — H&P (View-Only) (Signed)
PCP is Sparks, Leonie Douglas, MD Referring Provider is Erby Pian, MD  Chief Complaint  Patient presents with   Lung Lesion    Surgical consult, PET Scan 10/19/21/ MR Brain 10/04/21/ Chest CT 10/02/21    HPI: Joseph Larson is a 66 y.o. man with PMH smoking, COPD, HTN, HLD, CAD, GERD, arthritis, who was found to have an 37mm RUL nodule on screening CT 07/07/21. This was minimally enlarged to 33mm on 69mo f/up CT 10/02/21 and was found to be PET-avid with SUV 5.9. He has no evidence of nodal disease or distant spread. He did undergo brain MRI w/o evidence of metastasis.  He has otherwise been feeling generally well. He has had no recent weight loss. He has no exertional dyspnea or  chest pain. He exercises at the gym every other day, and occasionally uses his inhaler prior to exercise. He lives with his wife, who will be his primary support person post-op.  He does continue to smoke ~10 cigarettes/day. He has tried to quit multiple times previously and is contemplating another attempt, though is not optimistic.  He follows with a cardiologist for CAD diagnosed on chest CT, and pulmonologist in [place]. In July, he had some episodes of dizziness which were worked up with a carotid ultrasound without evidence of significant stenosis. He was found to have a cerumen impaction, the treatment of which improved his symptoms.  Zubrod Score: At the time of surgery this patient's most appropriate activity status/level should be described as: [x]     0    Normal activity, no symptoms []     1    Restricted in physical strenuous activity but ambulatory, able to do out light work []     2    Ambulatory and capable of self care, unable to do work activities, up and about >50 % of waking hours                              []     3    Only limited self care, in bed greater than 50% of waking hours []     4    Completely disabled, no self care, confined to bed or chair []     5    Moribund  Past Medical History:   Diagnosis Date   Angina pectoris (HCC)    Anxiety    Arthritis    osteoarthritis   BPH (benign prostatic hyperplasia)    Coronary atherosclerosis    ED (erectile dysfunction)    Elevated PSA    Erectile dysfunction    Esophageal spasm    GERD (gastroesophageal reflux disease)    Hyperlipidemia    Hypertension    Neuralgia    Pancreatitis    Rectus diastasis    Rhinitis    chronic    Past Surgical History:  Procedure Laterality Date   arthroscopic shoulder Left    CARDIAC CATHETERIZATION  2010   CHOLECYSTECTOMY     COLONOSCOPY WITH PROPOFOL N/A 07/01/2015   Procedure: COLONOSCOPY WITH PROPOFOL;  Surgeon: Manya Silvas, MD;  Location: [facility] ENDOSCOPY;  Service: Endoscopy;  Laterality: N/A;   ESOPHAGOGASTRODUODENOSCOPY (EGD) WITH PROPOFOL N/A 07/01/2015   Procedure: ESOPHAGOGASTRODUODENOSCOPY (EGD) WITH PROPOFOL;  Surgeon: Manya Silvas, MD;  Location: [facility] ENDOSCOPY;  Service: Endoscopy;  Laterality: N/A;   EUS  01/17/2012   Procedure: UPPER ENDOSCOPIC ULTRASOUND (EUS) LINEAR;  Surgeon: Milus Banister, MD;  Location: [facility] ENDOSCOPY;  Service:  Endoscopy;  Laterality: N/A;  radial linear    EXCISION MORTON'S NEUROMA     Left Foot   HERNIA REPAIR     Right inguinal   HOLEP-LASER ENUCLEATION OF THE PROSTATE WITH MORCELLATION N/A 06/26/2019   Procedure: HOLEP-LASER ENUCLEATION OF THE PROSTATE WITH MORCELLATION;  Surgeon: Billey Co, MD;  Location: [facility] ORS;  Service: Urology;  Laterality: N/A;    Family History  Problem Relation Age of Onset   Prostate cancer Brother    Kidney disease Neg Hx    Kidney cancer Neg Hx    Bladder Cancer Neg Hx     Social History Social History   Tobacco Use   Smoking status: Every Day    Packs/day: 0.50    Years: 40.00    Total pack years: 20.00    Types: Cigarettes    Last attempt to quit: 09/2016    Years since quitting: 5.1   Smokeless tobacco: Never  Substance Use Topics   Alcohol use: No    Alcohol/week: 0.0 standard  drinks of alcohol   Drug use: No    Current Outpatient Medications  Medication Sig Dispense Refill   acetaminophen (TYLENOL) 500 MG tablet Take 1,000 mg by mouth every 6 (six) hours as needed for moderate pain or headache.     albuterol (VENTOLIN HFA) 108 (90 Base) MCG/ACT inhaler Inhale 2 puffs into the lungs every 6 (six) hours as needed for wheezing or shortness of breath.     alprostadil (EDEX) 10 MCG injection 10 mcg by Intracavitary route as needed for erectile dysfunction. use no more than 3 times per week 1 each 12   cyclobenzaprine (FLEXERIL) 10 MG tablet Take 10 mg by mouth daily as needed for muscle spasms.      fexofenadine (ALLEGRA) 180 MG tablet Take 180 mg by mouth daily as needed for allergies or rhinitis.     HYDROcodone-acetaminophen (NORCO/VICODIN) 5-325 MG tablet Take 1 tablet by mouth every 6 (six) hours as needed for moderate pain.     ibuprofen (ADVIL) 200 MG tablet Take 400 mg by mouth every 6 (six) hours as needed for headache or moderate pain.     MAGNESIUM PO Take 1 tablet by mouth 2 (two) times a week.     Melatonin 10 MG CAPS Take 10 mg by mouth at bedtime.     meloxicam (MOBIC) 15 MG tablet Take 15 mg by mouth every evening.      pantoprazole (PROTONIX) 40 MG tablet Take 40 mg by mouth See admin instructions. Take 40 mg in the evening, may take a second 40 mg dose as needed for acid reflux     sildenafil (VIAGRA) 100 MG tablet Take 1 tablet (100 mg total) by mouth daily as needed for erectile dysfunction. 15 tablet 11   tadalafil (CIALIS) 20 MG tablet Take 1 tablet (20 mg total) by mouth daily as needed for erectile dysfunction. 15 tablet 11   No current facility-administered medications for this visit.    No Known Allergies  Review of Systems  Constitutional: Negative.   HENT:  Positive for postnasal drip.   Eyes: Negative.   Respiratory: Negative.    Cardiovascular: Negative.   Gastrointestinal: Negative.   Endocrine: Negative.   Genitourinary:  Negative.   Musculoskeletal:  Positive for back pain.  Skin: Negative.   Allergic/Immunologic: Negative.   Neurological: Negative.   Hematological: Negative.   Psychiatric/Behavioral: Negative.      BP (!) 147/76 (BP Location: Left Arm, Patient Position: Sitting,  Cuff Size: Normal)   Pulse (!) 57   Resp 20   Ht 5\' 11"  (1.803 m)   Wt 195 lb 2.4 oz (88.5 kg)   SpO2 97% Comment: RA  BMI 27.22 kg/m  Physical Exam Constitutional:      Appearance: Normal appearance.  HENT:     Head: Normocephalic and atraumatic.     Mouth/Throat:     Mouth: Mucous membranes are moist.  Eyes:     Pupils: Pupils are equal, round, and reactive to light.  Cardiovascular:     Rate and Rhythm: Normal rate and regular rhythm.     Pulses: Normal pulses.     Heart sounds: Normal heart sounds.  Pulmonary:     Effort: Pulmonary effort is normal.     Breath sounds: Normal breath sounds.  Abdominal:     Palpations: Abdomen is soft.  Musculoskeletal:     Cervical back: Normal range of motion.  Skin:    General: Skin is warm and dry.  Neurological:     Mental Status: He is alert.      Diagnostic Tests: CT chest 10/02/21 IMPRESSION: 1. Irregular 9 mm right upper lobe nodule, minimally enlarged from 07/07/2021 and essentially new from 07/07/2019. Findings are highly worrisome for primary bronchogenic carcinoma. 2. Mild subpleural reticulation and ground-glass without a definite zonal predominance, suspicious for interstitial lung disease. If further evaluation is desired, high-resolution chest CT without contrast is suggested. 3. Aortic atherosclerosis (ICD10-I70.0). Coronary artery calcification. 4.  Emphysema (ICD10-J43.9).  PET 10/19/21 IMPRESSION: 1. Hypermetabolic 1 cm right upper lobe pulmonary nodule is most consistent with primary bronchogenic carcinoma. 2. No hypermetabolic thoracic adenopathy. 3. No evidence of hypermetabolic distant metastatic disease. 4. Non hypermetabolic distal  esophageal wall thickening may reflect sequela of gastroesophageal reflux. 5. Moderate-sized fat containing left inguinal hernia. 6. Aortic Atherosclerosis (ICD10-I70.0) and Emphysema (ICD10-J43.9).  MRI brain 10/15/21 IMPRESSION:  No evidence of recent infarction, hemorrhage, or mass.    Echo stress test 09/07/19 INTERPRETATION  Normal Stress Echocardiogram  NORMAL RIGHT VENTRICULAR SYSTOLIC FUNCTION  TRIVIAL REGURGITATION NOTED (See above)  NO VALVULAR STENOSIS NOTED   OSH PFT 07/12/21 FEV1 75% predicted  Carotid US 10/04/21 Minimal bilateral intimal thickening without evidence of significant stenosis.  Impression: 5M smoker with ~1cm RUL subpleural pulmonary nodule, suspicious for cancer. Discussed management options including surgery vs. non-operative management; discussed risks and benefits of surgery. Patient prefers to proceed with surgery for chance of cure.  Will plan for robot-assisted RUL wedge, possible anterior segmentectomy, possible lobectomy and mediastinal lymph node sampling on 12/18/21.   Nicanor Alcon, MD, Resident [facility] [phone]  I reviewed the records and saw and examined Joseph Larson. I agree with the history, physical, radiology interpretations and plan as outlined above.  66 yo man with history of tobacco abuse with a 1 cm hypermetabolic nodule in the right upper lobe. Highly suspicious for a new primary bronchogenic carcinoma. Infectious and inflammatory nodules are in the differential but this has to be considered a cancer unless it can be proven otherwise.  I recommended we proceed with robotic right upper lobe wedge to confirm diagnosis to be followed by segmentectomy or lobectomy if frozen positive.  I informed him of the general nature of the procedure including the need for general anesthesia, the incisions to be used the use of the surgical robot, the use of a drainage tube postoperatively as well the expected  hospital stay and overall recovery.  I informed him of  the indications, risks, benefits and alternatives. He understands the risks include but are not limited to death, MI, DVT, PE, bleeding, possible need for transfusion, infection, prolonged air leaks, cardiac arrhythmias as well as the possibility of other unforeseeable complications.  He accepts the risks and agrees to proceed.  Revonda Standard Roxan Hockey, MD [facility] [phone]

## 2021-11-09 NOTE — Progress Notes (Deleted)
FEV1 75% predicted 07/12/21

## 2021-11-09 NOTE — Progress Notes (Signed)
PCP is Sparks, Leonie Douglas, MD Referring Provider is Erby Pian, MD  Chief Complaint  Patient presents with   Lung Lesion    Surgical consult, PET Scan 10/19/21/ MR Brain 10/04/21/ Chest CT 10/02/21    HPI: Joseph Larson is a 66 y.o. man with PMH smoking, COPD, HTN, HLD, CAD, GERD, arthritis, who was found to have an 63mm RUL nodule on screening CT 07/07/21. This was minimally enlarged to 62mm on 71mo f/up CT 10/02/21 and was found to be PET-avid with SUV 5.9. He has no evidence of nodal disease or distant spread. He did undergo brain MRI w/o evidence of metastasis.  He has otherwise been feeling generally well. He has had no recent weight loss. He has no exertional dyspnea or  chest pain. He exercises at the gym every other day, and occasionally uses his inhaler prior to exercise. He lives with his wife, who will be his primary support person post-op.  He does continue to smoke ~10 cigarettes/day. He has tried to quit multiple times previously and is contemplating another attempt, though is not optimistic.  He follows with a cardiologist for CAD diagnosed on chest CT, and pulmonologist in [place]. In July, he had some episodes of dizziness which were worked up with a carotid ultrasound without evidence of significant stenosis. He was found to have a cerumen impaction, the treatment of which improved his symptoms.  Zubrod Score: At the time of surgery this patient's most appropriate activity status/level should be described as: [x]     0    Normal activity, no symptoms []     1    Restricted in physical strenuous activity but ambulatory, able to do out light work []     2    Ambulatory and capable of self care, unable to do work activities, up and about >50 % of waking hours                              []     3    Only limited self care, in bed greater than 50% of waking hours []     4    Completely disabled, no self care, confined to bed or chair []     5    Moribund  Past Medical History:   Diagnosis Date   Angina pectoris (HCC)    Anxiety    Arthritis    osteoarthritis   BPH (benign prostatic hyperplasia)    Coronary atherosclerosis    ED (erectile dysfunction)    Elevated PSA    Erectile dysfunction    Esophageal spasm    GERD (gastroesophageal reflux disease)    Hyperlipidemia    Hypertension    Neuralgia    Pancreatitis    Rectus diastasis    Rhinitis    chronic    Past Surgical History:  Procedure Laterality Date   arthroscopic shoulder Left    CARDIAC CATHETERIZATION  2010   CHOLECYSTECTOMY     COLONOSCOPY WITH PROPOFOL N/A 07/01/2015   Procedure: COLONOSCOPY WITH PROPOFOL;  Surgeon: Manya Silvas, MD;  Location: [facility] ENDOSCOPY;  Service: Endoscopy;  Laterality: N/A;   ESOPHAGOGASTRODUODENOSCOPY (EGD) WITH PROPOFOL N/A 07/01/2015   Procedure: ESOPHAGOGASTRODUODENOSCOPY (EGD) WITH PROPOFOL;  Surgeon: Manya Silvas, MD;  Location: [facility] ENDOSCOPY;  Service: Endoscopy;  Laterality: N/A;   EUS  01/17/2012   Procedure: UPPER ENDOSCOPIC ULTRASOUND (EUS) LINEAR;  Surgeon: Milus Banister, MD;  Location: [facility] ENDOSCOPY;  Service:  Endoscopy;  Laterality: N/A;  radial linear    EXCISION MORTON'S NEUROMA     Left Foot   HERNIA REPAIR     Right inguinal   HOLEP-LASER ENUCLEATION OF THE PROSTATE WITH MORCELLATION N/A 06/26/2019   Procedure: HOLEP-LASER ENUCLEATION OF THE PROSTATE WITH MORCELLATION;  Surgeon: Billey Co, MD;  Location: [facility] ORS;  Service: Urology;  Laterality: N/A;    Family History  Problem Relation Age of Onset   Prostate cancer Brother    Kidney disease Neg Hx    Kidney cancer Neg Hx    Bladder Cancer Neg Hx     Social History Social History   Tobacco Use   Smoking status: Every Day    Packs/day: 0.50    Years: 40.00    Total pack years: 20.00    Types: Cigarettes    Last attempt to quit: 09/2016    Years since quitting: 5.1   Smokeless tobacco: Never  Substance Use Topics   Alcohol use: No    Alcohol/week: 0.0 standard  drinks of alcohol   Drug use: No    Current Outpatient Medications  Medication Sig Dispense Refill   acetaminophen (TYLENOL) 500 MG tablet Take 1,000 mg by mouth every 6 (six) hours as needed for moderate pain or headache.     albuterol (VENTOLIN HFA) 108 (90 Base) MCG/ACT inhaler Inhale 2 puffs into the lungs every 6 (six) hours as needed for wheezing or shortness of breath.     alprostadil (EDEX) 10 MCG injection 10 mcg by Intracavitary route as needed for erectile dysfunction. use no more than 3 times per week 1 each 12   cyclobenzaprine (FLEXERIL) 10 MG tablet Take 10 mg by mouth daily as needed for muscle spasms.      fexofenadine (ALLEGRA) 180 MG tablet Take 180 mg by mouth daily as needed for allergies or rhinitis.     HYDROcodone-acetaminophen (NORCO/VICODIN) 5-325 MG tablet Take 1 tablet by mouth every 6 (six) hours as needed for moderate pain.     ibuprofen (ADVIL) 200 MG tablet Take 400 mg by mouth every 6 (six) hours as needed for headache or moderate pain.     MAGNESIUM PO Take 1 tablet by mouth 2 (two) times a week.     Melatonin 10 MG CAPS Take 10 mg by mouth at bedtime.     meloxicam (MOBIC) 15 MG tablet Take 15 mg by mouth every evening.      pantoprazole (PROTONIX) 40 MG tablet Take 40 mg by mouth See admin instructions. Take 40 mg in the evening, may take a second 40 mg dose as needed for acid reflux     sildenafil (VIAGRA) 100 MG tablet Take 1 tablet (100 mg total) by mouth daily as needed for erectile dysfunction. 15 tablet 11   tadalafil (CIALIS) 20 MG tablet Take 1 tablet (20 mg total) by mouth daily as needed for erectile dysfunction. 15 tablet 11   No current facility-administered medications for this visit.    No Known Allergies  Review of Systems  Constitutional: Negative.   HENT:  Positive for postnasal drip.   Eyes: Negative.   Respiratory: Negative.    Cardiovascular: Negative.   Gastrointestinal: Negative.   Endocrine: Negative.   Genitourinary:  Negative.   Musculoskeletal:  Positive for back pain.  Skin: Negative.   Allergic/Immunologic: Negative.   Neurological: Negative.   Hematological: Negative.   Psychiatric/Behavioral: Negative.      BP (!) 147/76 (BP Location: Left Arm, Patient Position: Sitting,  Cuff Size: Normal)   Pulse (!) 57   Resp 20   Ht 5\' 11"  (1.803 m)   Wt 195 lb 2.4 oz (88.5 kg)   SpO2 97% Comment: RA  BMI 27.22 kg/m  Physical Exam Constitutional:      Appearance: Normal appearance.  HENT:     Head: Normocephalic and atraumatic.     Mouth/Throat:     Mouth: Mucous membranes are moist.  Eyes:     Pupils: Pupils are equal, round, and reactive to light.  Cardiovascular:     Rate and Rhythm: Normal rate and regular rhythm.     Pulses: Normal pulses.     Heart sounds: Normal heart sounds.  Pulmonary:     Effort: Pulmonary effort is normal.     Breath sounds: Normal breath sounds.  Abdominal:     Palpations: Abdomen is soft.  Musculoskeletal:     Cervical back: Normal range of motion.  Skin:    General: Skin is warm and dry.  Neurological:     Mental Status: He is alert.      Diagnostic Tests: CT chest 10/02/21 IMPRESSION: 1. Irregular 9 mm right upper lobe nodule, minimally enlarged from 07/07/2021 and essentially new from 07/07/2019. Findings are highly worrisome for primary bronchogenic carcinoma. 2. Mild subpleural reticulation and ground-glass without a definite zonal predominance, suspicious for interstitial lung disease. If further evaluation is desired, high-resolution chest CT without contrast is suggested. 3. Aortic atherosclerosis (ICD10-I70.0). Coronary artery calcification. 4.  Emphysema (ICD10-J43.9).  PET 10/19/21 IMPRESSION: 1. Hypermetabolic 1 cm right upper lobe pulmonary nodule is most consistent with primary bronchogenic carcinoma. 2. No hypermetabolic thoracic adenopathy. 3. No evidence of hypermetabolic distant metastatic disease. 4. Non hypermetabolic distal  esophageal wall thickening may reflect sequela of gastroesophageal reflux. 5. Moderate-sized fat containing left inguinal hernia. 6. Aortic Atherosclerosis (ICD10-I70.0) and Emphysema (ICD10-J43.9).  MRI brain 10/15/21 IMPRESSION:  No evidence of recent infarction, hemorrhage, or mass.    Echo stress test 09/07/19 INTERPRETATION  Normal Stress Echocardiogram  NORMAL RIGHT VENTRICULAR SYSTOLIC FUNCTION  TRIVIAL REGURGITATION NOTED (See above)  NO VALVULAR STENOSIS NOTED   OSH PFT 07/12/21 FEV1 75% predicted  Carotid US 10/04/21 Minimal bilateral intimal thickening without evidence of significant stenosis.  Impression: 31M smoker with ~1cm RUL subpleural pulmonary nodule, suspicious for cancer. Discussed management options including surgery vs. non-operative management; discussed risks and benefits of surgery. Patient prefers to proceed with surgery for chance of cure.  Will plan for robot-assisted RUL wedge, possible anterior segmentectomy, possible lobectomy and mediastinal lymph node sampling on 12/18/21.   Nicanor Alcon, MD, Resident [facility] [phone]  I reviewed the records and saw and examined Joseph Larson. I agree with the history, physical, radiology interpretations and plan as outlined above.  66 yo man with history of tobacco abuse with a 1 cm hypermetabolic nodule in the right upper lobe. Highly suspicious for a new primary bronchogenic carcinoma. Infectious and inflammatory nodules are in the differential but this has to be considered a cancer unless it can be proven otherwise.  I recommended we proceed with robotic right upper lobe wedge to confirm diagnosis to be followed by segmentectomy or lobectomy if frozen positive.  I informed him of the general nature of the procedure including the need for general anesthesia, the incisions to be used the use of the surgical robot, the use of a drainage tube postoperatively as well the expected  hospital stay and overall recovery.  I informed him of  the indications, risks, benefits and alternatives. He understands the risks include but are not limited to death, MI, DVT, PE, bleeding, possible need for transfusion, infection, prolonged air leaks, cardiac arrhythmias as well as the possibility of other unforeseeable complications.  He accepts the risks and agrees to proceed.  Revonda Standard Roxan Hockey, MD [facility] [phone]

## 2021-11-10 ENCOUNTER — Other Ambulatory Visit: Payer: Self-pay | Admitting: *Deleted

## 2021-11-10 ENCOUNTER — Encounter: Payer: Self-pay | Admitting: *Deleted

## 2021-11-10 DIAGNOSIS — R911 Solitary pulmonary nodule: Secondary | ICD-10-CM

## 2021-11-14 NOTE — Pre-Procedure Instructions (Signed)
Surgical Instructions    Your procedure is scheduled on Friday August 25th.  Report to [facility] Main Entrance "A" at 5:30 A.M., then check in with the Admitting office.  Call this number if you have problems the morning of surgery:  [phone]   If you have any questions prior to your surgery date call [phone]: Open Monday-Friday 8am-4pm    Remember:  Do not eat or drink after midnight the night before your surgery.    Take these medicines the morning of surgery with A SIP OF WATER:   IF NEEDED: acetaminophen (TYLENOL) Albuterol inhaler- please bring to hospital on day of surgery cyclobenzaprine (FLEXERIL) fexofenadine (ALLEGRA) HYDROcodone-acetaminophen (NORCO/VICODIN)  As of today, STOP taking any Aspirin (unless otherwise instructed by your surgeon) Aleve, Naproxen, Ibuprofen, Motrin, Advil, Goody's, BC's, all herbal medications, fish oil, and all vitamins. This includes: meloxicam (MOBIC).           Do not wear jewelry. Do not wear lotions, powders, cologne or deodorant. Men may shave face and neck. Do not bring valuables to the hospital.  [facility] is not responsible for any belongings or valuables.    Do NOT Smoke (Tobacco/Vaping)  24 hours prior to your procedure  If you use a CPAP at night, you may bring your mask for your overnight stay.   Contacts, glasses, hearing aids, dentures or partials may not be worn into surgery, please bring cases for these belongings   For patients admitted to the hospital, discharge time will be determined by your treatment team.   Patients discharged the day of surgery will not be allowed to drive home, and someone needs to stay with them for 24 hours.   SURGICAL WAITING ROOM VISITATION Patients having surgery or a procedure may have no more than 2 support people in the waiting area - these visitors may rotate.   Children under the age of 64 must have an adult with them who is not the patient. If the patient needs to  stay at the hospital during part of their recovery, the visitor guidelines for inpatient rooms apply. Pre-op nurse will coordinate an appropriate time for 1 support person to accompany patient in pre-op.  This support person may not rotate.   Please refer to the [facility] website for the visitor guidelines for Inpatients (after your surgery is over and you are in a regular room).    Special instructions:    Oral Hygiene is also important to reduce your risk of infection.  Remember - BRUSH YOUR TEETH THE MORNING OF SURGERY WITH YOUR REGULAR TOOTHPASTE   [place]- Preparing For Surgery  Before surgery, you can play an important role. Because skin is not sterile, your skin needs to be as free of germs as possible. You can reduce the number of germs on your skin by washing with CHG (chlorahexidine gluconate) Soap before surgery.  CHG is an antiseptic cleaner which kills germs and bonds with the skin to continue killing germs even after washing.     Please do not use if you have an allergy to CHG or antibacterial soaps. If your skin becomes reddened/irritated stop using the CHG.  Do not shave (including legs and underarms) for at least 48 hours prior to first CHG shower. It is OK to shave your face.  Please follow these instructions carefully.     Shower the NIGHT BEFORE SURGERY and the MORNING OF SURGERY with CHG Soap.   If you chose to wash your hair, wash your hair  first as usual with your normal shampoo. After you shampoo, rinse your hair and body thoroughly to remove the shampoo.  Then [employer] and genitals (private parts) with your normal soap and rinse thoroughly to remove soap.  After that Use CHG Soap as you would any other liquid soap. You can apply CHG directly to the skin and wash gently with a scrungie or a clean washcloth.   Apply the CHG Soap to your body ONLY FROM THE NECK DOWN.  Do not use on open wounds or open sores. Avoid contact with your eyes, ears, mouth and genitals  (private parts). Wash Face and genitals (private parts)  with your normal soap.   Wash thoroughly, paying special attention to the area where your surgery will be performed.  Thoroughly rinse your body with warm water from the neck down.  DO NOT shower/wash with your normal soap after using and rinsing off the CHG Soap.  Pat yourself dry with a CLEAN TOWEL.  Wear CLEAN PAJAMAS to bed the night before surgery  Place CLEAN SHEETS on your bed the night before your surgery  DO NOT SLEEP WITH PETS.   Day of Surgery:  Take a shower with CHG soap. Wear Clean/Comfortable clothing the morning of surgery Do not apply any deodorants/lotions.   Remember to brush your teeth WITH YOUR REGULAR TOOTHPASTE.    If you received a COVID test during your pre-op visit, it is requested that you wear a mask when out in public, stay away from anyone that may not be feeling well, and notify your surgeon if you develop symptoms. If you have been in contact with anyone that has tested positive in the last 10 days, please notify your surgeon.    Please read over the following fact sheets that you were given.

## 2021-11-15 ENCOUNTER — Encounter (HOSPITAL_COMMUNITY)
Admission: RE | Admit: 2021-11-15 | Discharge: 2021-11-15 | Disposition: A | Discharge status: Home / Self Care | Payer: Medicare Other | Source: Ambulatory Visit | Attending: Thoracic Surgery (Cardiothoracic Vascular Surgery) | Admitting: Thoracic Surgery (Cardiothoracic Vascular Surgery)

## 2021-11-15 ENCOUNTER — Other Ambulatory Visit: Payer: Self-pay

## 2021-11-15 ENCOUNTER — Encounter (HOSPITAL_COMMUNITY): Payer: Self-pay

## 2021-11-15 ENCOUNTER — Ambulatory Visit (HOSPITAL_COMMUNITY)
Admission: RE | Admit: 2021-11-15 | Discharge: 2021-11-15 | Disposition: A | Discharge status: Home / Self Care | Payer: Medicare Other | Source: Ambulatory Visit | Attending: Thoracic Surgery (Cardiothoracic Vascular Surgery) | Admitting: Thoracic Surgery (Cardiothoracic Vascular Surgery)

## 2021-11-15 DIAGNOSIS — R911 Solitary pulmonary nodule: Secondary | ICD-10-CM

## 2021-11-15 DIAGNOSIS — Z01818 Encounter for other preprocedural examination: Secondary | ICD-10-CM | POA: Insufficient documentation

## 2021-11-15 DIAGNOSIS — N200 Calculus of kidney: Secondary | ICD-10-CM | POA: Insufficient documentation

## 2021-11-15 DIAGNOSIS — I2510 Atherosclerotic heart disease of native coronary artery without angina pectoris: Secondary | ICD-10-CM | POA: Insufficient documentation

## 2021-11-15 DIAGNOSIS — Z20822 Contact with and (suspected) exposure to covid-19: Secondary | ICD-10-CM | POA: Insufficient documentation

## 2021-11-15 DIAGNOSIS — E119 Type 2 diabetes mellitus without complications: Secondary | ICD-10-CM | POA: Insufficient documentation

## 2021-11-15 DIAGNOSIS — K219 Gastro-esophageal reflux disease without esophagitis: Secondary | ICD-10-CM | POA: Insufficient documentation

## 2021-11-15 DIAGNOSIS — E785 Hyperlipidemia, unspecified: Secondary | ICD-10-CM | POA: Insufficient documentation

## 2021-11-15 DIAGNOSIS — J449 Chronic obstructive pulmonary disease, unspecified: Secondary | ICD-10-CM | POA: Insufficient documentation

## 2021-11-15 DIAGNOSIS — F17200 Nicotine dependence, unspecified, uncomplicated: Secondary | ICD-10-CM | POA: Insufficient documentation

## 2021-11-15 DIAGNOSIS — I10 Essential (primary) hypertension: Secondary | ICD-10-CM | POA: Insufficient documentation

## 2021-11-15 DIAGNOSIS — Z9049 Acquired absence of other specified parts of digestive tract: Secondary | ICD-10-CM | POA: Insufficient documentation

## 2021-11-15 HISTORY — DX: Personal history of urinary calculi: Z87.442

## 2021-11-15 HISTORY — DX: Pneumonia, unspecified organism: J18.9

## 2021-11-15 HISTORY — DX: Chronic obstructive pulmonary disease, unspecified: J44.9

## 2021-11-15 LAB — BLOOD GAS, ARTERIAL
Acid-Base Excess: 1.2 mmol/L (ref 0.0–2.0)
Bicarbonate: 26 mmol/L (ref 20.0–28.0)
Drawn by: 58793
O2 Saturation: 98.9 %
Patient temperature: 37
pCO2 arterial: 41 mmHg (ref 32–48)
pH, Arterial: 7.41 (ref 7.35–7.45)
pO2, Arterial: 97 mmHg (ref 83–108)

## 2021-11-15 LAB — CBC
HCT: 44.9 % (ref 39.0–52.0)
Hemoglobin: 15.2 g/dL (ref 13.0–17.0)
MCH: 31.3 pg (ref 26.0–34.0)
MCHC: 33.9 g/dL (ref 30.0–36.0)
MCV: 92.4 fL (ref 80.0–100.0)
Platelets: 175 10*3/uL (ref 150–400)
RBC: 4.86 MIL/uL (ref 4.22–5.81)
RDW: 11.8 % (ref 11.5–15.5)
WBC: 7.6 10*3/uL (ref 4.0–10.5)
nRBC: 0 % (ref 0.0–0.2)

## 2021-11-15 LAB — TYPE AND SCREEN
ABO/RH(D): B POS
Antibody Screen: NEGATIVE

## 2021-11-15 LAB — COMPREHENSIVE METABOLIC PANEL
ALT: 20 U/L (ref 0–44)
AST: 23 U/L (ref 15–41)
Albumin: 3.9 g/dL (ref 3.5–5.0)
Alkaline Phosphatase: 78 U/L (ref 38–126)
Anion gap: 7 (ref 5–15)
BUN: 22 mg/dL (ref 8–23)
CO2: 22 mmol/L (ref 22–32)
Calcium: 8.7 mg/dL — ABNORMAL LOW (ref 8.9–10.3)
Chloride: 109 mmol/L (ref 98–111)
Creatinine, Ser: 1.07 mg/dL (ref 0.61–1.24)
GFR, Estimated: >60 mL/min (ref >60)
Glucose, Bld: 110 mg/dL — ABNORMAL HIGH (ref 70–99)
Potassium: 4.5 mmol/L (ref 3.5–5.1)
Sodium: 138 mmol/L (ref 135–145)
Total Bilirubin: 0.7 mg/dL (ref 0.3–1.2)
Total Protein: 7 g/dL (ref 6.5–8.1)

## 2021-11-15 LAB — URINALYSIS, ROUTINE W REFLEX MICROSCOPIC
Bilirubin Urine: NEGATIVE
Glucose, UA: NEGATIVE mg/dL
Hgb urine dipstick: NEGATIVE
Ketones, ur: NEGATIVE mg/dL
Leukocytes,Ua: NEGATIVE
Nitrite: NEGATIVE
Protein, ur: NEGATIVE mg/dL
Specific Gravity, Urine: 1.027 (ref 1.005–1.030)
pH: 5 (ref 5.0–8.0)

## 2021-11-15 LAB — SURGICAL PCR SCREEN
MRSA, PCR: NEGATIVE
Staphylococcus aureus: NEGATIVE

## 2021-11-15 LAB — HEMOGLOBIN A1C
Hgb A1c MFr Bld: 5.5 % (ref 4.8–5.6)
Mean Plasma Glucose: 111.15 mg/dL

## 2021-11-15 LAB — PROTIME-INR
INR: 1 (ref 0.8–1.2)
Prothrombin Time: 12.9 seconds (ref 11.4–15.2)

## 2021-11-15 LAB — APTT: aPTT: 29 seconds (ref 24–36)

## 2021-11-15 NOTE — Progress Notes (Signed)
Anesthesia Chart Review:  Case: [id number] Date/Time: 11/17/21 0715   Procedures:      XI ROBOTIC ASSISTED THORACOSCOPY-RIGHT UPPER LOBE WEDGE RESECTION (Right: Chest)     XI ROBOTIC ASSISTED THORACOSCOPY-POSSIBLE SEGMENTECTOMY VS LOBECTOMY (Right: Chest)   Anesthesia type: General   Pre-op diagnosis: RUL nodule   Location: [facility] OR ROOM 10 / [place] OR   Surgeons: [name], MD       DISCUSSION: Patient is a 66 year old male scheduled for the above procedure.  History includes smoking, COPD, coronary atherosclerosis (by CT; cardiac cath in 2010 for chest pain evaluation), HTN, HLD, GERD, nephrolithiasis, cholecystectomy (10/2011, due to gallstone pancreatitis; recurrent pancreatitis 12/2011 with unclear etiology by 01/17/12 EGD/EUS findings), BPH (s/p HoLEP 06/26/19), right inguinal hernia repair. No current alcohol use is documented.   Surgeon classified his Zubrod Score as 0: Normal activity, no symptoms. Normal stress echo 08/2019.   EKG showed SB at 44 bpm. HR trends noted since 06/2021 have been 49-63 bpm since 07/12/21. He is on bisoprolol-HCTZ 5/6.25 mg daily.  11/15/21 COVID-19 test and CXR are in process.   VS: BP (P) 139/80   Pulse (!) (P) 53   Temp (!) (P) 36.4 C   Resp (P) 18   Ht (P) 6' (1.829 m)   Wt (P) 87.8 kg   SpO2 (P) 100%   BMI (P) 26.24 kg/m    PROVIDERS: Idelle Crouch, MD is PCP Jef[facility] Bryant, [facility])  Wallene Huh, MD is pulmonologist [facility], [facility]) Serafina Royals, MD is cardiologist Jefm Bryant, [place]). Last visit 04/25/20 for coronary atherosclerosis by CT scan, PVD, HTN, smoking. Smoking cessation and risk factor modification recommended. 08/2019 stress echo was normal.  Return is symptoms worsen or fail to improve.    LABS: Labs reviewed: Acceptable for surgery. (all labs ordered are listed, but only abnormal results are displayed)  Labs Reviewed  COMPREHENSIVE METABOLIC PANEL - Abnormal; Notable for the following components:      Result Value    Glucose, Bld 110 (*)    Calcium 8.7 (*)    All other components within normal limits  SURGICAL PCR SCREEN  SARS CORONAVIRUS 2 (TAT 6-24 HRS)  CBC  BLOOD GAS, ARTERIAL  PROTIME-INR  APTT  URINALYSIS, ROUTINE W REFLEX MICROSCOPIC  HEMOGLOBIN A1C  TYPE AND SCREEN    A1c 6.2% on 08/31/21 ([facility] CE).   IMAGES: PET Scan 10/19/21: IMPRESSION: 1. Hypermetabolic 1 cm right upper lobe pulmonary nodule is most consistent with primary bronchogenic carcinoma. 2. No hypermetabolic thoracic adenopathy. 3. No evidence of hypermetabolic distant metastatic disease. 4. Non hypermetabolic distal esophageal wall thickening may reflect sequela of gastroesophageal reflux. 5. Moderate-sized fat containing left inguinal hernia. 6. Aortic Atherosclerosis (ICD10-I70.0) and Emphysema (ICD10-J43.9).   MRI Brain 10/04/21: IMPRESSION: No evidence of recent infarction, hemorrhage, or mass.  CT Chest 10/02/21: IMPRESSION: 1. Irregular 9 mm right upper lobe nodule, minimally enlarged from 07/07/2021 and essentially new from 07/07/2019. Findings are highly worrisome for primary bronchogenic carcinoma. 2. Mild subpleural reticulation and ground-glass without a definite zonal predominance, suspicious for interstitial lung disease. If further evaluation is desired, high-resolution chest CT without contrast is suggested. 3. Aortic atherosclerosis (ICD10-I70.0). Coronary artery calcification. 4.  Emphysema (ICD10-J43.9).    EKG: 11/15/21: Marked SB at 44 bpm. (See DISCUSSION)   CV: US Carotid 10/04/21 (PACS/[facility]): IMPRESSION: Very minimal amount of bilateral intimal thickening/atherosclerotic plaque, not resulting in a hemodynamically significant stenosis within either internal carotid artery.  Echo stress test 09/07/19 ([facility] CE): INTERPRETATION  Normal Stress Echocardiogram.  LVEF > 55%. NORMAL RIGHT VENTRICULAR SYSTOLIC FUNCTION  TRIVIAL REGURGITATION NOTED (Trivial MR/TR)  NO VALVULAR STENOSIS  NOTED    Echo 05/25/15 ([facility] CE): INTERPRETATION  NORMAL LEFT VENTRICULAR SYSTOLIC FUNCTION  NORMAL RIGHT VENTRICULAR SYSTOLIC FUNCTION  MILD VALVULAR REGURGITATION (Trivial AR/MR/PR, mild TR)  NO VALVULAR STENOSIS    Prior cardiac cath in 2010.  Past Medical History:  Diagnosis Date   Anxiety    Arthritis    osteoarthritis   BPH (benign prostatic hyperplasia)    COPD (chronic obstructive pulmonary disease) (HCC)    Coronary atherosclerosis    ED (erectile dysfunction)    Elevated PSA    Erectile dysfunction    Esophageal spasm    GERD (gastroesophageal reflux disease)    History of kidney stones    passed stone   Hyperlipidemia    Hypertension    Neuralgia    Pancreatitis    Pneumonia    Rectus diastasis    Rhinitis    chronic    Past Surgical History:  Procedure Laterality Date   arthroscopic shoulder Left    CARDIAC CATHETERIZATION  2010   CHOLECYSTECTOMY     COLONOSCOPY WITH PROPOFOL N/A 07/01/2015   Procedure: COLONOSCOPY WITH PROPOFOL;  Surgeon: Manya Silvas, MD;  Location: [facility];  Service: Endoscopy;  Laterality: N/A;   ESOPHAGOGASTRODUODENOSCOPY (EGD) WITH PROPOFOL N/A 07/01/2015   Procedure: ESOPHAGOGASTRODUODENOSCOPY (EGD) WITH PROPOFOL;  Surgeon: Manya Silvas, MD;  Location: [facility] ENDOSCOPY;  Service: Endoscopy;  Laterality: N/A;   EUS  01/17/2012   Procedure: UPPER ENDOSCOPIC ULTRASOUND (EUS) LINEAR;  Surgeon: Milus Banister, MD;  Location: [facility] ENDOSCOPY;  Service: Endoscopy;  Laterality: N/A;  radial linear    EXCISION MORTON'S NEUROMA     Left Foot   HERNIA REPAIR     Right inguinal   HOLEP-LASER ENUCLEATION OF THE PROSTATE WITH MORCELLATION N/A 06/26/2019   Procedure: HOLEP-LASER ENUCLEATION OF THE PROSTATE WITH MORCELLATION;  Surgeon: Billey Co, MD;  Location: [facility] ORS;  Service: Urology;  Laterality: N/A;    MEDICATIONS:  acetaminophen (TYLENOL) 500 MG tablet   albuterol (VENTOLIN HFA) 108 (90 Base) MCG/ACT inhaler    bisoprolol-hydrochlorothiazide (ZIAC) 5-6.25 MG tablet   cyclobenzaprine (FLEXERIL) 10 MG tablet   fexofenadine (ALLEGRA) 180 MG tablet   HYDROcodone-acetaminophen (NORCO/VICODIN) 5-325 MG tablet   ibuprofen (ADVIL) 200 MG tablet   meloxicam (MOBIC) 15 MG tablet   pantoprazole (PROTONIX) 40 MG tablet   sildenafil (VIAGRA) 100 MG tablet   No current facility-administered medications for this encounter.

## 2021-11-15 NOTE — Progress Notes (Signed)
PCP - Dr Fulton Reek Cardiologist - n/a Pulmonology - Dr Wallene Huh  CT Chest x-ray - 11/15/21 EKG - 11/15/21 Stress Test - 09/07/19 CE ECHO - 09/07/19 CE Cardiac Cath - 2010  ICD Pacemaker/Loop - n/a  Sleep Study -  n/a CPAP - none  Anesthesia review: Yes  STOP now taking any Aspirin (unless otherwise instructed by your surgeon), Aleve, Naproxen, Ibuprofen, Motrin, Advil, Goody's, BC's, all herbal medications, fish oil, and all vitamins.   Coronavirus Screening Covid test at PAT appt. On 11/15/21 Do you have any of the following symptoms:  Cough yes/no: No Fever (>100.8F)  yes/no: No Runny nose yes/no: No Sore throat yes/no: No Difficulty breathing/shortness of breath  yes/no: No  Have you traveled in the last 14 days and where? yes/no: No  Patient verbalized understanding of instructions that were given via phone.

## 2021-11-16 LAB — SARS CORONAVIRUS 2 (TAT 6-24 HRS): SARS Coronavirus 2: NEGATIVE

## 2021-11-16 NOTE — Anesthesia Preprocedure Evaluation (Addendum)
Anesthesia Evaluation  Patient identified by MRN, date of birth, ID band Patient awake    Reviewed: Allergy & Precautions, NPO status , Patient's Chart, lab work & pertinent test results  Airway Mallampati: II  TM Distance: <3 FB Neck ROM: Full    Dental  (+) Teeth Intact, Dental Advisory Given   Pulmonary COPD,  COPD inhaler, Current Smoker and Patient abstained from smoking.,    breath sounds clear to auscultation       Cardiovascular hypertension, + CAD   Rhythm:Regular Rate:Normal     Neuro/Psych  Headaches, Anxiety    GI/Hepatic Neg liver ROS, Medicated,  Endo/Other  negative endocrine ROS  Renal/GU negative Renal ROS     Musculoskeletal  (+) Arthritis ,   Abdominal Normal abdominal exam  (+)   Peds  Hematology negative hematology ROS (+)   Anesthesia Other Findings   Reproductive/Obstetrics                            Anesthesia Physical Anesthesia Plan  ASA: 3  Anesthesia Plan: General   Post-op Pain Management:    Induction: Intravenous  PONV Risk Score and Plan: 2 and Ondansetron, Midazolam and Dexamethasone  Airway Management Planned: Double Lumen EBT  Additional Equipment: Arterial line  Intra-op Plan:   Post-operative Plan: Extubation in OR  Informed Consent: I have reviewed the patients History and Physical, chart, labs and discussed the procedure including the risks, benefits and alternatives for the proposed anesthesia with the patient or authorized representative who has indicated his/her understanding and acceptance.     Dental advisory given  Plan Discussed with: CRNA  Anesthesia Plan Comments: (PAT note written 11/16/2021 by Myra Gianotti, PA-C. )       Anesthesia Quick Evaluation

## 2021-11-17 ENCOUNTER — Other Ambulatory Visit: Payer: Self-pay

## 2021-11-17 ENCOUNTER — Inpatient Hospital Stay (HOSPITAL_COMMUNITY): Payer: Medicare Other | Admitting: Vascular Surgery

## 2021-11-17 ENCOUNTER — Inpatient Hospital Stay (HOSPITAL_COMMUNITY): Payer: Medicare Other | Admitting: Certified Registered Nurse Anesthetist

## 2021-11-17 ENCOUNTER — Encounter (HOSPITAL_COMMUNITY)
Admission: RE | Disposition: A | Discharge status: Home Health | Payer: Self-pay | Source: Home / Self Care | Attending: Thoracic Surgery (Cardiothoracic Vascular Surgery)

## 2021-11-17 ENCOUNTER — Inpatient Hospital Stay (HOSPITAL_COMMUNITY)
Admission: RE | Admit: 2021-11-17 | Discharge: 2021-11-25 | DRG: 164 | Disposition: A | Discharge status: Home Health | Payer: Medicare Other | Attending: Thoracic Surgery (Cardiothoracic Vascular Surgery) | Admitting: Thoracic Surgery (Cardiothoracic Vascular Surgery)

## 2021-11-17 ENCOUNTER — Encounter (HOSPITAL_COMMUNITY): Payer: Self-pay | Admitting: Thoracic Surgery (Cardiothoracic Vascular Surgery)

## 2021-11-17 ENCOUNTER — Inpatient Hospital Stay (HOSPITAL_COMMUNITY): Payer: Medicare Other

## 2021-11-17 DIAGNOSIS — N320 Bladder-neck obstruction: Secondary | ICD-10-CM | POA: Diagnosis present

## 2021-11-17 DIAGNOSIS — E785 Hyperlipidemia, unspecified: Secondary | ICD-10-CM | POA: Diagnosis present

## 2021-11-17 DIAGNOSIS — I739 Peripheral vascular disease, unspecified: Secondary | ICD-10-CM | POA: Diagnosis present

## 2021-11-17 DIAGNOSIS — C3411 Malignant neoplasm of upper lobe, right bronchus or lung: Secondary | ICD-10-CM | POA: Diagnosis present

## 2021-11-17 DIAGNOSIS — I700 Atherosclerosis of aorta: Secondary | ICD-10-CM | POA: Diagnosis present

## 2021-11-17 DIAGNOSIS — Z9049 Acquired absence of other specified parts of digestive tract: Secondary | ICD-10-CM

## 2021-11-17 DIAGNOSIS — R001 Bradycardia, unspecified: Secondary | ICD-10-CM | POA: Diagnosis present

## 2021-11-17 DIAGNOSIS — T8182XA Emphysema (subcutaneous) resulting from a procedure, initial encounter: Secondary | ICD-10-CM | POA: Diagnosis not present

## 2021-11-17 DIAGNOSIS — R338 Other retention of urine: Secondary | ICD-10-CM | POA: Diagnosis present

## 2021-11-17 DIAGNOSIS — Z87442 Personal history of urinary calculi: Secondary | ICD-10-CM

## 2021-11-17 DIAGNOSIS — F17210 Nicotine dependence, cigarettes, uncomplicated: Secondary | ICD-10-CM | POA: Diagnosis present

## 2021-11-17 DIAGNOSIS — I10 Essential (primary) hypertension: Secondary | ICD-10-CM | POA: Diagnosis present

## 2021-11-17 DIAGNOSIS — Z902 Acquired absence of lung [part of]: Secondary | ICD-10-CM

## 2021-11-17 DIAGNOSIS — I471 Supraventricular tachycardia: Secondary | ICD-10-CM | POA: Diagnosis present

## 2021-11-17 DIAGNOSIS — N401 Enlarged prostate with lower urinary tract symptoms: Secondary | ICD-10-CM | POA: Diagnosis present

## 2021-11-17 DIAGNOSIS — R339 Retention of urine, unspecified: Secondary | ICD-10-CM

## 2021-11-17 DIAGNOSIS — Z20822 Contact with and (suspected) exposure to covid-19: Secondary | ICD-10-CM | POA: Diagnosis present

## 2021-11-17 DIAGNOSIS — Y838 Other surgical procedures as the cause of abnormal reaction of the patient, or of later complication, without mention of misadventure at the time of the procedure: Secondary | ICD-10-CM | POA: Diagnosis not present

## 2021-11-17 DIAGNOSIS — I2510 Atherosclerotic heart disease of native coronary artery without angina pectoris: Secondary | ICD-10-CM

## 2021-11-17 DIAGNOSIS — J439 Emphysema, unspecified: Secondary | ICD-10-CM | POA: Diagnosis present

## 2021-11-17 DIAGNOSIS — J9382 Other air leak: Secondary | ICD-10-CM | POA: Diagnosis not present

## 2021-11-17 DIAGNOSIS — J939 Pneumothorax, unspecified: Secondary | ICD-10-CM | POA: Diagnosis not present

## 2021-11-17 DIAGNOSIS — Z79899 Other long term (current) drug therapy: Secondary | ICD-10-CM

## 2021-11-17 DIAGNOSIS — J9811 Atelectasis: Secondary | ICD-10-CM | POA: Diagnosis not present

## 2021-11-17 DIAGNOSIS — F419 Anxiety disorder, unspecified: Secondary | ICD-10-CM | POA: Diagnosis present

## 2021-11-17 DIAGNOSIS — Z8042 Family history of malignant neoplasm of prostate: Secondary | ICD-10-CM | POA: Diagnosis not present

## 2021-11-17 DIAGNOSIS — K5900 Constipation, unspecified: Secondary | ICD-10-CM | POA: Diagnosis not present

## 2021-11-17 DIAGNOSIS — R911 Solitary pulmonary nodule: Principal | ICD-10-CM | POA: Diagnosis present

## 2021-11-17 DIAGNOSIS — Z7984 Long term (current) use of oral hypoglycemic drugs: Secondary | ICD-10-CM

## 2021-11-17 DIAGNOSIS — I480 Paroxysmal atrial fibrillation: Secondary | ICD-10-CM | POA: Diagnosis present

## 2021-11-17 DIAGNOSIS — Z01818 Encounter for other preprocedural examination: Secondary | ICD-10-CM

## 2021-11-17 DIAGNOSIS — C3491 Malignant neoplasm of unspecified part of right bronchus or lung: Secondary | ICD-10-CM | POA: Diagnosis present

## 2021-11-17 DIAGNOSIS — K219 Gastro-esophageal reflux disease without esophagitis: Secondary | ICD-10-CM | POA: Diagnosis present

## 2021-11-17 HISTORY — PX: XI ROBOTIC ASSISTED THORACOSCOPY- SEGMENTECTOMY: SHX6881

## 2021-11-17 HISTORY — PX: LYMPH NODE DISSECTION: SHX5087

## 2021-11-17 HISTORY — PX: INTERCOSTAL NERVE BLOCK: SHX5021

## 2021-11-17 LAB — GLUCOSE, CAPILLARY
Glucose-Capillary: 124 mg/dL — ABNORMAL HIGH (ref 70–99)
Glucose-Capillary: 165 mg/dL — ABNORMAL HIGH (ref 70–99)

## 2021-11-17 LAB — ABO/RH: ABO/RH(D): B POS

## 2021-11-17 SURGERY — WEDGE RESECTION, LUNG, ROBOT-ASSISTED, THORACOSCOPIC
Anesthesia: General | Site: Chest | Laterality: Right

## 2021-11-17 MED ORDER — PHENYLEPHRINE HCL-NACL 20-0.9 MG/250ML-% IV SOLN
INTRAVENOUS | Status: DC | PRN
  Administered 2021-11-17: 25 ug/min via INTRAVENOUS

## 2021-11-17 MED ORDER — ONDANSETRON HCL 4 MG/2ML IJ SOLN
INTRAMUSCULAR | Status: AC
  Filled 2021-11-17: qty 2

## 2021-11-17 MED ORDER — KETOROLAC TROMETHAMINE 15 MG/ML IJ SOLN
15.0000 mg | Freq: Four times a day (QID) | INTRAMUSCULAR | Status: AC
  Administered 2021-11-17 – 2021-11-19 (×8): 15 mg via INTRAVENOUS
  Filled 2021-11-17 (×8): qty 1

## 2021-11-17 MED ORDER — INSULIN ASPART 100 UNIT/ML IJ SOLN
0.0000 [IU] | Freq: Three times a day (TID) | INTRAMUSCULAR | Status: DC
  Administered 2021-11-17: 4 [IU] via SUBCUTANEOUS

## 2021-11-17 MED ORDER — MELOXICAM 7.5 MG PO TABS: 15.0000 mg | ORAL_TABLET | Freq: Every evening | ORAL | Status: DC

## 2021-11-17 MED ORDER — SUGAMMADEX SODIUM 200 MG/2ML IV SOLN
INTRAVENOUS | Status: DC | PRN
  Administered 2021-11-17: 200 mg via INTRAVENOUS

## 2021-11-17 MED ORDER — ACETAMINOPHEN 325 MG PO TABS: 325.0000 mg | ORAL_TABLET | Freq: Once | ORAL | Status: DC | PRN

## 2021-11-17 MED ORDER — FENTANYL CITRATE PF 50 MCG/ML IJ SOSY
25.0000 ug | PREFILLED_SYRINGE | INTRAMUSCULAR | Status: DC | PRN
  Administered 2021-11-17: 50 ug via INTRAVENOUS
  Filled 2021-11-17: qty 1

## 2021-11-17 MED ORDER — OXYCODONE HCL 5 MG PO TABS
5.0000 mg | ORAL_TABLET | ORAL | Status: DC | PRN
  Administered 2021-11-17: 5 mg via ORAL
  Administered 2021-11-18 (×2): 10 mg via ORAL
  Filled 2021-11-17 (×4): qty 2

## 2021-11-17 MED ORDER — LORATADINE 10 MG PO TABS
10.0000 mg | ORAL_TABLET | Freq: Every day | ORAL | Status: DC
  Administered 2021-11-17: 10 mg via ORAL
  Filled 2021-11-17 (×2): qty 1

## 2021-11-17 MED ORDER — SUCCINYLCHOLINE CHLORIDE 200 MG/10ML IV SOSY
PREFILLED_SYRINGE | INTRAVENOUS | Status: AC
  Filled 2021-11-17: qty 10

## 2021-11-17 MED ORDER — MIDAZOLAM HCL 2 MG/2ML IJ SOLN
INTRAMUSCULAR | Status: AC
  Filled 2021-11-17: qty 2

## 2021-11-17 MED ORDER — ORAL CARE MOUTH RINSE: 15.0000 mL | Freq: Once | OROMUCOSAL | Status: AC

## 2021-11-17 MED ORDER — HYDROMORPHONE HCL 1 MG/ML IJ SOLN
0.2500 mg | INTRAMUSCULAR | Status: DC | PRN
  Administered 2021-11-17 (×4): 0.5 mg via INTRAVENOUS

## 2021-11-17 MED ORDER — ROCURONIUM BROMIDE 10 MG/ML (PF) SYRINGE
PREFILLED_SYRINGE | INTRAVENOUS | Status: AC
  Filled 2021-11-17: qty 10

## 2021-11-17 MED ORDER — AMISULPRIDE (ANTIEMETIC) 5 MG/2ML IV SOLN: 10.0000 mg | Freq: Once | INTRAVENOUS | Status: DC | PRN

## 2021-11-17 MED ORDER — CHLORHEXIDINE GLUCONATE CLOTH 2 % EX PADS
6.0000 | MEDICATED_PAD | Freq: Every day | CUTANEOUS | Status: DC
  Administered 2021-11-18 – 2021-11-25 (×8): 6 via TOPICAL

## 2021-11-17 MED ORDER — LIDOCAINE 2% (20 MG/ML) 5 ML SYRINGE
INTRAMUSCULAR | Status: AC
  Filled 2021-11-17: qty 5

## 2021-11-17 MED ORDER — LACTATED RINGERS IV SOLN: INTRAVENOUS | Status: DC

## 2021-11-17 MED ORDER — PHENYLEPHRINE 80 MCG/ML (10ML) SYRINGE FOR IV PUSH (FOR BLOOD PRESSURE SUPPORT)
PREFILLED_SYRINGE | INTRAVENOUS | Status: DC | PRN
  Administered 2021-11-17 (×2): 80 ug via INTRAVENOUS

## 2021-11-17 MED ORDER — ENOXAPARIN SODIUM 40 MG/0.4ML IJ SOSY
40.0000 mg | PREFILLED_SYRINGE | Freq: Every day | INTRAMUSCULAR | Status: DC
  Administered 2021-11-18 – 2021-11-25 (×8): 40 mg via SUBCUTANEOUS
  Filled 2021-11-17 (×8): qty 0.4

## 2021-11-17 MED ORDER — BUPIVACAINE HCL (PF) 0.5 % IJ SOLN
INTRAMUSCULAR | Status: AC
  Filled 2021-11-17: qty 30

## 2021-11-17 MED ORDER — LACTATED RINGERS IV SOLN: INTRAVENOUS | Status: DC | PRN

## 2021-11-17 MED ORDER — DEXAMETHASONE SODIUM PHOSPHATE 10 MG/ML IJ SOLN
INTRAMUSCULAR | Status: DC | PRN
  Administered 2021-11-17: 10 mg via INTRAVENOUS

## 2021-11-17 MED ORDER — ALBUTEROL SULFATE HFA 108 (90 BASE) MCG/ACT IN AERS: 2.0000 | INHALATION_SPRAY | Freq: Four times a day (QID) | RESPIRATORY_TRACT | Status: DC | PRN

## 2021-11-17 MED ORDER — BUPIVACAINE LIPOSOME 1.3 % IJ SUSP
INTRAMUSCULAR | Status: AC
Start: 2021-11-17 — End: ?
  Filled 2021-11-17: qty 20

## 2021-11-17 MED ORDER — ACETAMINOPHEN 10 MG/ML IV SOLN
INTRAVENOUS | Status: AC
  Filled 2021-11-17: qty 100

## 2021-11-17 MED ORDER — PANTOPRAZOLE SODIUM 40 MG PO TBEC
40.0000 mg | DELAYED_RELEASE_TABLET | Freq: Every evening | ORAL | Status: DC
  Administered 2021-11-17 – 2021-11-24 (×8): 40 mg via ORAL
  Filled 2021-11-17 (×8): qty 1

## 2021-11-17 MED ORDER — MEPERIDINE HCL 25 MG/ML IJ SOLN: 6.2500 mg | INTRAMUSCULAR | Status: DC | PRN

## 2021-11-17 MED ORDER — CEFAZOLIN SODIUM-DEXTROSE 2-4 GM/100ML-% IV SOLN
2.0000 g | INTRAVENOUS | Status: AC
  Administered 2021-11-17: 2 g via INTRAVENOUS
  Filled 2021-11-17: qty 100

## 2021-11-17 MED ORDER — CYCLOBENZAPRINE HCL 10 MG PO TABS
10.0000 mg | ORAL_TABLET | Freq: Three times a day (TID) | ORAL | Status: DC | PRN
  Administered 2021-11-17 – 2021-11-24 (×7): 10 mg via ORAL
  Filled 2021-11-17 (×7): qty 1

## 2021-11-17 MED ORDER — TAMSULOSIN HCL 0.4 MG PO CAPS
0.4000 mg | ORAL_CAPSULE | Freq: Every day | ORAL | Status: DC
  Administered 2021-11-17 – 2021-11-25 (×9): 0.4 mg via ORAL
  Filled 2021-11-17 (×9): qty 1

## 2021-11-17 MED ORDER — PROPOFOL 10 MG/ML IV BOLUS
INTRAVENOUS | Status: DC | PRN
  Administered 2021-11-17: 50 mg via INTRAVENOUS
  Administered 2021-11-17: 75 mg via INTRAVENOUS
  Administered 2021-11-17: 25 mg via INTRAVENOUS
  Administered 2021-11-17: 50 mg via INTRAVENOUS

## 2021-11-17 MED ORDER — DEXAMETHASONE SODIUM PHOSPHATE 10 MG/ML IJ SOLN
INTRAMUSCULAR | Status: AC
  Filled 2021-11-17: qty 1

## 2021-11-17 MED ORDER — ACETAMINOPHEN 500 MG PO TABS
1000.0000 mg | ORAL_TABLET | Freq: Four times a day (QID) | ORAL | Status: AC
  Administered 2021-11-17 – 2021-11-22 (×17): 1000 mg via ORAL
  Filled 2021-11-17 (×18): qty 2

## 2021-11-17 MED ORDER — FENTANYL CITRATE (PF) 250 MCG/5ML IJ SOLN
INTRAMUSCULAR | Status: DC | PRN
Start: 2021-11-17 — End: 2021-11-17
  Administered 2021-11-17 (×2): 50 ug via INTRAVENOUS
  Administered 2021-11-17: 100 ug via INTRAVENOUS
  Administered 2021-11-17: 50 ug via INTRAVENOUS

## 2021-11-17 MED ORDER — ACETAMINOPHEN 160 MG/5ML PO SOLN: 325.0000 mg | Freq: Once | ORAL | Status: DC | PRN

## 2021-11-17 MED ORDER — ROCURONIUM BROMIDE 50 MG/5ML IV SOSY
PREFILLED_SYRINGE | INTRAVENOUS | Status: DC | PRN
  Administered 2021-11-17: 100 mg via INTRAVENOUS
  Administered 2021-11-17: 30 mg via INTRAVENOUS
  Administered 2021-11-17: 40 mg via INTRAVENOUS
  Administered 2021-11-17: 10 mg via INTRAVENOUS

## 2021-11-17 MED ORDER — SODIUM CHLORIDE 0.9 % IV SOLN
INTRAVENOUS | Status: AC | PRN
  Administered 2021-11-17: 1000 mL

## 2021-11-17 MED ORDER — INDOCYANINE GREEN 25 MG IV SOLR
INTRAVENOUS | Status: AC
  Filled 2021-11-17: qty 10

## 2021-11-17 MED ORDER — ONDANSETRON HCL 4 MG/2ML IJ SOLN
4.0000 mg | Freq: Four times a day (QID) | INTRAMUSCULAR | Status: DC | PRN
  Administered 2021-11-19 – 2021-11-22 (×6): 4 mg via INTRAVENOUS
  Filled 2021-11-17 (×6): qty 2

## 2021-11-17 MED ORDER — ACETAMINOPHEN 160 MG/5ML PO SOLN
1000.0000 mg | Freq: Four times a day (QID) | ORAL | Status: AC
  Filled 2021-11-17: qty 40.6

## 2021-11-17 MED ORDER — SODIUM CHLORIDE FLUSH 0.9 % IV SOLN
INTRAVENOUS | Status: DC | PRN
  Administered 2021-11-17: 100 mL

## 2021-11-17 MED ORDER — PROPOFOL 10 MG/ML IV BOLUS
INTRAVENOUS | Status: AC
  Filled 2021-11-17: qty 20

## 2021-11-17 MED ORDER — FENTANYL CITRATE (PF) 250 MCG/5ML IJ SOLN
INTRAMUSCULAR | Status: AC
  Filled 2021-11-17: qty 5

## 2021-11-17 MED ORDER — PROMETHAZINE HCL 25 MG/ML IJ SOLN: 6.2500 mg | INTRAMUSCULAR | Status: DC | PRN

## 2021-11-17 MED ORDER — BISACODYL 5 MG PO TBEC
10.0000 mg | DELAYED_RELEASE_TABLET | Freq: Every day | ORAL | Status: DC
Start: 2021-11-17 — End: 2021-11-22
  Administered 2021-11-18 – 2021-11-21 (×4): 10 mg via ORAL
  Filled 2021-11-17 (×5): qty 2

## 2021-11-17 MED ORDER — CHLORHEXIDINE GLUCONATE 0.12 % MT SOLN
15.0000 mL | Freq: Once | OROMUCOSAL | Status: AC
Start: 2021-11-17 — End: 2021-11-17
  Administered 2021-11-17: 15 mL via OROMUCOSAL
  Filled 2021-11-17: qty 15

## 2021-11-17 MED ORDER — DEXTROSE IN LACTATED RINGERS 5 % IV SOLN: INTRAVENOUS | Status: DC

## 2021-11-17 MED ORDER — ONDANSETRON HCL 4 MG/2ML IJ SOLN
INTRAMUSCULAR | Status: DC | PRN
  Administered 2021-11-17: 4 mg via INTRAVENOUS

## 2021-11-17 MED ORDER — CEFAZOLIN SODIUM-DEXTROSE 2-4 GM/100ML-% IV SOLN
2.0000 g | Freq: Three times a day (TID) | INTRAVENOUS | Status: AC
  Administered 2021-11-17 (×2): 2 g via INTRAVENOUS
  Filled 2021-11-17 (×2): qty 100

## 2021-11-17 MED ORDER — MIDAZOLAM HCL 5 MG/5ML IJ SOLN
INTRAMUSCULAR | Status: DC | PRN
  Administered 2021-11-17 (×2): 1 mg via INTRAVENOUS

## 2021-11-17 MED ORDER — ACETAMINOPHEN 10 MG/ML IV SOLN
1000.0000 mg | Freq: Once | INTRAVENOUS | Status: DC | PRN
  Administered 2021-11-17: 1000 mg via INTRAVENOUS

## 2021-11-17 MED ORDER — HYDROMORPHONE HCL 1 MG/ML IJ SOLN
INTRAMUSCULAR | Status: AC
  Filled 2021-11-17: qty 2

## 2021-11-17 MED ORDER — SENNOSIDES-DOCUSATE SODIUM 8.6-50 MG PO TABS
1.0000 | ORAL_TABLET | Freq: Every day | ORAL | Status: DC
  Administered 2021-11-17 – 2021-11-21 (×5): 1 via ORAL
  Filled 2021-11-17 (×5): qty 1

## 2021-11-17 MED ORDER — 0.9 % SODIUM CHLORIDE (POUR BTL) OPTIME
TOPICAL | Status: DC | PRN
  Administered 2021-11-17: 2000 mL

## 2021-11-17 MED ORDER — ALBUTEROL SULFATE (2.5 MG/3ML) 0.083% IN NEBU: 2.5000 mg | INHALATION_SOLUTION | Freq: Four times a day (QID) | RESPIRATORY_TRACT | Status: DC | PRN

## 2021-11-17 SURGICAL SUPPLY — 129 items
ADH SKN CLS APL DERMABOND .7 (GAUZE/BANDAGES/DRESSINGS) ×1
APPLIER CLIP ROT 10 11.4 M/L (STAPLE)
APR CLP MED LRG 11.4X10 (STAPLE)
BAG SPEC RTRVL C1550 15 (MISCELLANEOUS) ×1
BAG TISS RTRVL C300 12X14 (MISCELLANEOUS) ×1
BLADE CLIPPER SURG (BLADE) ×2 IMPLANT
BNDG COHESIVE 6X5 TAN STRL LF (GAUZE/BANDAGES/DRESSINGS) IMPLANT
CANISTER SUCT 3000ML PPV (MISCELLANEOUS) ×4 IMPLANT
CANNULA REDUC XI 12-8 STAPL (CANNULA) ×2
CANNULA REDUCER 12-8 DVNC XI (CANNULA) ×4 IMPLANT
CATH COUDE FOLEY 2W 5CC 16FR (CATHETERS) IMPLANT
CATH COUDE FOLEY 5CC 14FR (CATHETERS) IMPLANT
CATH FOLEY 2WAY SLVR  5CC 12FR (CATHETERS) ×1
CATH FOLEY 2WAY SLVR 5CC 12FR (CATHETERS) IMPLANT
CLIP APPLIE ROT 10 11.4 M/L (STAPLE) IMPLANT
CLIP VESOCCLUDE MED 6/CT (CLIP) IMPLANT
CNTNR URN SCR LID CUP LEK RST (MISCELLANEOUS) ×10 IMPLANT
CONN ST 1/4X3/8  BEN (MISCELLANEOUS)
CONN ST 1/4X3/8 BEN (MISCELLANEOUS) IMPLANT
CONT SPEC 4OZ STRL OR WHT (MISCELLANEOUS) ×16
DEFOGGER SCOPE WARMER CLEARIFY (MISCELLANEOUS) ×2 IMPLANT
DERMABOND ADVANCED (GAUZE/BANDAGES/DRESSINGS) ×1
DERMABOND ADVANCED .7 DNX12 (GAUZE/BANDAGES/DRESSINGS) ×2 IMPLANT
DRAIN CHANNEL 28F RND 3/8 FF (WOUND CARE) IMPLANT
DRAIN CHANNEL 32F RND 10.7 FF (WOUND CARE) IMPLANT
DRAPE ARM DVNC X/XI (DISPOSABLE) ×8 IMPLANT
DRAPE COLUMN DVNC XI (DISPOSABLE) ×2 IMPLANT
DRAPE CV SPLIT W-CLR ANES SCRN (DRAPES) ×2 IMPLANT
DRAPE DA VINCI XI ARM (DISPOSABLE) ×4
DRAPE DA VINCI XI COLUMN (DISPOSABLE) ×1
DRAPE HALF SHEET 40X57 (DRAPES) ×2 IMPLANT
DRAPE INCISE IOBAN 66X45 STRL (DRAPES) IMPLANT
DRAPE ORTHO SPLIT 77X108 STRL (DRAPES) ×1
DRAPE SURG ORHT 6 SPLT 77X108 (DRAPES) ×2 IMPLANT
ELECT BLADE 6.5 EXT (BLADE) IMPLANT
ELECT REM PT RETURN 9FT ADLT (ELECTROSURGICAL) ×1
ELECTRODE REM PT RTRN 9FT ADLT (ELECTROSURGICAL) ×2 IMPLANT
GAUZE 4X4 16PLY ~~LOC~~+RFID DBL (SPONGE) ×2 IMPLANT
GAUZE KITTNER 4X5 RF (MISCELLANEOUS) ×4 IMPLANT
GAUZE SPONGE 4X4 12PLY STRL (GAUZE/BANDAGES/DRESSINGS) ×2 IMPLANT
GLOVE SURG MICRO LTX SZ7.5 (GLOVE) ×4 IMPLANT
GOWN STRL REUS W/ TWL LRG LVL3 (GOWN DISPOSABLE) ×4 IMPLANT
GOWN STRL REUS W/ TWL XL LVL3 (GOWN DISPOSABLE) ×6 IMPLANT
GOWN STRL REUS W/TWL 2XL LVL3 (GOWN DISPOSABLE) ×4 IMPLANT
GOWN STRL REUS W/TWL LRG LVL3 (GOWN DISPOSABLE) ×2
GOWN STRL REUS W/TWL XL LVL3 (GOWN DISPOSABLE) ×3
HEMOSTAT SURGICEL 2X14 (HEMOSTASIS) ×8 IMPLANT
IRRIGATION STRYKERFLOW (MISCELLANEOUS) ×2 IMPLANT
IRRIGATOR STRYKERFLOW (MISCELLANEOUS) ×1
KIT BASIN OR (CUSTOM PROCEDURE TRAY) ×2 IMPLANT
KIT SUCTION CATH 14FR (SUCTIONS) IMPLANT
KIT TURNOVER KIT B (KITS) ×2 IMPLANT
NDL HYPO 25GX1X1/2 BEV (NEEDLE) ×2 IMPLANT
NDL SPNL 22GX3.5 QUINCKE BK (NEEDLE) ×2 IMPLANT
NEEDLE HYPO 25GX1X1/2 BEV (NEEDLE) ×1 IMPLANT
NEEDLE SPNL 22GX3.5 QUINCKE BK (NEEDLE) ×1 IMPLANT
NS IRRIG 1000ML POUR BTL (IV SOLUTION) ×2 IMPLANT
PACK CHEST (CUSTOM PROCEDURE TRAY) ×2 IMPLANT
PAD ARMBOARD 7.5X6 YLW CONV (MISCELLANEOUS) ×4 IMPLANT
PORT ACCESS TROCAR AIRSEAL 12 (TROCAR) ×2 IMPLANT
PORT ACCESS TROCAR AIRSEAL 5M (TROCAR) ×1
RELOAD EGIA 45 MED/THCK PURPLE (STAPLE) IMPLANT
RELOAD EGIA BLACK ROTIC 45MM (STAPLE) ×1 IMPLANT
RELOAD STAPLE 45 2.5 WHT DVNC (STAPLE) IMPLANT
RELOAD STAPLE 45 3.5 BLU DVNC (STAPLE) IMPLANT
RELOAD STAPLE 45 4.3 GRN DVNC (STAPLE) IMPLANT
RELOAD STAPLE 45 4.6 BLK DVNC (STAPLE) IMPLANT
RELOAD STAPLE 45 BLK XTHK (STAPLE) IMPLANT
RELOAD STAPLER 2.5X45 WHT DVNC (STAPLE) ×4 IMPLANT
RELOAD STAPLER 3.5X45 BLU DVNC (STAPLE) ×3 IMPLANT
RELOAD STAPLER 4.3X45 GRN DVNC (STAPLE) ×5 IMPLANT
RELOAD STAPLER 45 4.6 BLK DVNC (STAPLE) ×1 IMPLANT
SCISSORS LAP 5X35 DISP (ENDOMECHANICALS) IMPLANT
SEAL CANN UNIV 5-8 DVNC XI (MISCELLANEOUS) ×4 IMPLANT
SEAL XI 5MM-8MM UNIVERSAL (MISCELLANEOUS) ×2
SEALANT PROGEL (MISCELLANEOUS) IMPLANT
SET TRI-LUMEN FLTR TB AIRSEAL (TUBING) ×2 IMPLANT
SHEARS HARMONIC HDI 20CM (ELECTROSURGICAL) IMPLANT
SOLUTION ELECTROLUBE (MISCELLANEOUS) ×2 IMPLANT
SPONGE GAUZE 4X4 16PLY NONSTR (GAUZE/BANDAGES/DRESSINGS) IMPLANT
SPONGE INTESTINAL PEANUT (DISPOSABLE) IMPLANT
SPONGE T-LAP 18X18 ~~LOC~~+RFID (SPONGE) ×8 IMPLANT
SPONGE T-LAP 4X18 ~~LOC~~+RFID (SPONGE) ×2 IMPLANT
SPONGE TONSIL TAPE 1 RFD (DISPOSABLE) IMPLANT
STAPLER 45 SUREFORM CVD (STAPLE) ×1
STAPLER 45 SUREFORM CVD DVNC (STAPLE) IMPLANT
STAPLER CANNULA SEAL DVNC XI (STAPLE) ×4 IMPLANT
STAPLER CANNULA SEAL XI (STAPLE) ×2
STAPLER ENDO GIA 12 SHRT THIN (STAPLE) IMPLANT
STAPLER ENDO GIA 12MM SHORT (STAPLE) ×1 IMPLANT
STAPLER RELOAD 2.5X45 WHITE (STAPLE) ×4
STAPLER RELOAD 2.5X45 WHT DVNC (STAPLE) ×4
STAPLER RELOAD 3.5X45 BLU DVNC (STAPLE) ×3
STAPLER RELOAD 3.5X45 BLUE (STAPLE) ×3
STAPLER RELOAD 4.3X45 GREEN (STAPLE) ×5
STAPLER RELOAD 4.3X45 GRN DVNC (STAPLE) ×5
STAPLER RELOAD 45 4.6 BLK (STAPLE) ×1
STAPLER RELOAD 45 4.6 BLK DVNC (STAPLE) ×1
SUT PDS AB 3-0 SH 27 (SUTURE) IMPLANT
SUT PROLENE 4 0 RB 1 (SUTURE)
SUT PROLENE 4-0 RB1 .5 CRCL 36 (SUTURE) IMPLANT
SUT SILK  1 MH (SUTURE) ×2
SUT SILK 1 MH (SUTURE) ×4 IMPLANT
SUT SILK 1 TIES 10X30 (SUTURE) ×2 IMPLANT
SUT SILK 2 0 SH (SUTURE) IMPLANT
SUT SILK 2 0SH CR/8 30 (SUTURE) IMPLANT
SUT SILK 3 0SH CR/8 30 (SUTURE) IMPLANT
SUT VIC AB 1 CTX 36 (SUTURE)
SUT VIC AB 1 CTX36XBRD ANBCTR (SUTURE) IMPLANT
SUT VIC AB 2-0 CTX 36 (SUTURE) IMPLANT
SUT VIC AB 3-0 MH 27 (SUTURE) IMPLANT
SUT VIC AB 3-0 X1 27 (SUTURE) ×4 IMPLANT
SUT VICRYL 0 TIES 12 18 (SUTURE) ×2 IMPLANT
SUT VICRYL 0 UR6 27IN ABS (SUTURE) ×4 IMPLANT
SUT VICRYL 2 TP 1 (SUTURE) IMPLANT
SYR 20CC LL (SYRINGE) ×4 IMPLANT
SYR 20ML LL LF (SYRINGE) ×4 IMPLANT
SYSTEM RETRIEVAL ANCHOR 12 (MISCELLANEOUS) IMPLANT
SYSTEM RETRIEVAL ANCHOR 15 (MISCELLANEOUS) IMPLANT
SYSTEM SAHARA CHEST DRAIN ATS (WOUND CARE) ×2 IMPLANT
TAPE CLOTH 4X10 WHT NS (GAUZE/BANDAGES/DRESSINGS) ×2 IMPLANT
TIP APPLICATOR SPRAY EXTEND 16 (VASCULAR PRODUCTS) IMPLANT
TOWEL GREEN STERILE (TOWEL DISPOSABLE) ×4 IMPLANT
TRAY FOLEY MTR SLVR 16FR STAT (SET/KITS/TRAYS/PACK) ×2 IMPLANT
TRAY FOLEY W/BAG SLVR 14FR (SET/KITS/TRAYS/PACK) IMPLANT
TROCAR BLADELESS 15MM (ENDOMECHANICALS) IMPLANT
TROCAR XCEL 12X100 BLDLESS (ENDOMECHANICALS) IMPLANT
TROCAR XCEL BLADELESS 5X75MML (TROCAR) IMPLANT
WATER STERILE IRR 1000ML POUR (IV SOLUTION) ×2 IMPLANT

## 2021-11-17 NOTE — Transfer of Care (Signed)
Immediate Anesthesia Transfer of Care Note  Patient: Joseph Larson  Procedure(s) Performed: XI ROBOTIC ASSISTED THORACOSCOPY-RIGHT UPPER LOBE WEDGE RESECTION (Right: Chest) XI ROBOTIC ASSISTED THORACOSCOPY WITH RIGHT UPPER LOBECTOMY (Right: Chest) INTERCOSTAL NERVE BLOCK (Right: Chest) LYMPH NODE DISSECTION (Right: Chest)  Patient Location: PACU  Anesthesia Type:General  Level of Consciousness: drowsy and patient cooperative  Airway & Oxygen Therapy: Patient Spontanous Breathing and Patient connected to nasal cannula oxygen  Post-op Assessment: Report given to RN and Post -op Vital signs reviewed and stable  Post vital signs: Reviewed and stable  Last Vitals:  Vitals Value Taken Time  BP 148/76 11/17/21 1215  Temp    Pulse 74 11/17/21 1222  Resp 26 11/17/21 1222  SpO2 90 % 11/17/21 1222  Vitals shown include unvalidated device data.  Last Pain:  Vitals:   11/17/21 1040  TempSrc:   PainSc: 0-No pain      Patients Stated Pain Goal: 3 (45/[id number]/36 [id number])  Complications:  Encounter Notable Events  Notable Event Outcome Phase Comment  Difficult to intubate - expected  Intraprocedure Filed from anesthesia note documentation.

## 2021-11-17 NOTE — Anesthesia Procedure Notes (Signed)
Central Venous Catheter Insertion Performed by: Effie Berkshire, MD, anesthesiologist Start/End8/25/2023 7:10 AM, 11/17/2021 7:20 AM Patient location: Pre-op. Preanesthetic checklist: patient identified, IV checked, site marked, risks and benefits discussed, surgical consent, monitors and equipment checked, pre-op evaluation, timeout performed and anesthesia consent Position: Trendelenburg Lidocaine 1% used for infiltration and patient sedated Hand hygiene performed , maximum sterile barriers used  and Seldinger technique used Catheter size: 8 Fr Total catheter length 16. Central line was placed.Double lumen Procedure performed using ultrasound guided technique. Ultrasound Notes:anatomy identified, needle tip was noted to be adjacent to the nerve/plexus identified, no ultrasound evidence of intravascular and/or intraneural injection and image(s) printed for medical record Attempts: 1 Following insertion, dressing applied, line sutured and Biopatch. Post procedure assessment: blood return through all ports  Patient tolerated the procedure well with no immediate complications.

## 2021-11-17 NOTE — Brief Op Note (Signed)
11/17/2021  1:48 PM  PATIENT:  Joseph Larson  66 y.o. 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, N0)  PROCEDURE:  Procedure(s): XI ROBOTIC ASSISTED THORACOSCOPY-RIGHT UPPER LOBE WEDGE RESECTION (Right) XI ROBOTIC ASSISTED THORACOSCOPY WITH RIGHT UPPER LOBECTOMY (Right) INTERCOSTAL NERVE BLOCK (Right) LYMPH NODE DISSECTION (Right)  SURGEON:  Surgeon(s) and Role:    * [name], MD - Primary  PHYSICIAN ASSISTANT:   ASSISTANTS: Nicanor Alcon, MD   ANESTHESIA:   general  EBL:  100 ml  BLOOD ADMINISTERED:none  DRAINS:  28 F  Chest Tube(s) in the right    LOCAL MEDICATIONS USED:  BUPIVICAINE- 20 ml Liposomal and 30 ml 0.5%  SPECIMEN:  Source of Specimen:  RUL and nodes  DISPOSITION OF SPECIMEN:  PATHOLOGY  COUNTS:  YES  TOURNIQUET:  * No tourniquets in log *  DICTATION: .Other Dictation: Dictation Number -  PLAN OF CARE: Admit to inpatient   PATIENT DISPOSITION:  PACU - hemodynamically stable.   Delay start of Pharmacological VTE agent (>24hrs) due to surgical blood loss or risk of bleeding: no

## 2021-11-17 NOTE — Op Note (Signed)
NAMEJOSHIAH, Joseph Larson MEDICAL RECORD NO: [id number] ACCOUNT NO: [id number] DATE OF BIRTH: Aug 10, 1955 FACILITY: [facility] LOCATION: [facility] PHYSICIAN: Revonda Standard. Roxan Hockey, MD  Operative Report   DATE OF PROCEDURE: 11/17/2021   PREOPERATIVE DIAGNOSIS:  Right upper lobe lung nodule.  POSTOPERATIVE DIAGNOSIS:  Non-small cell carcinoma, right upper lobe, clinical stage IA.  PROCEDURE:  Xi robotic-assisted right upper lobe wedge resection, Right upper lobectomy, Lymph node dissection and Intercostal nerve blocks levels 3 through 10.  SURGEON:  Revonda Standard. Roxan Hockey, MD  ASSISTANT:  Romie Levee, MD  ANESTHESIA:  General.  FINDINGS:  Nodule clearly visible in anterolateral aspect of right upper lobe.  Frozen section revealed non-small cell carcinoma.  Unusual arterial anatomy to the right upper lobe with posterior ascending branch arising more proximally from the main PA than normal.  Incomplete fissures.  CLINICAL NOTE: [name] is a 66 year old man with a history of tobacco abuse, who was found to have a right upper lobe nodule on a CT screening back in April.  On followup, the nodule had increased in size.  On PET/CT, it was avid with an SUV of 5.9.  Nodules highly suspicious for a new primary bronchogenic carcinoma, clinical stage was IA (T1, N0).  He was advised to undergo surgical resection for definitive diagnosis and treatment.  The plan would be to do a wedge resection and then an anatomic resection if the frozen section was positive.  The indications, risks, benefits, and alternatives were discussed in detail with the patient.  He understood and accepted the risks and agreed to proceed.  OPERATIVE NOTE: [name] was brought to the preoperative holding area on 11/17/2021.  The anesthesia service established intravenous access and placed an arterial blood pressure monitoring line.  He was taken to the operating room and anesthetized  and intubated.  Intravenous antibiotics were  administered.  Multiple attempts were made to place a Foley catheter, but resistance was met.  No force was used. Decision was made to proceed without a Foley in place. Sequential compression devices were  placed on the calves for DVT prophylaxis.  He was placed in a left lateral decubitus position.  A Bair Hugger was placed for active warming.  The right chest was prepped and draped in the usual sterile fashion.  Single lung ventilation of the left lung  was initiated and was tolerated well throughout the procedure.  A timeout was performed.  A solution containing 20 mL of liposomal bupivacaine, 30 mL of 0.5% bupivacaine and 50 mL of saline was prepared.  This solution was used for local at the incisions as well as for the intercostal nerve blocks.  An incision was made in  the eighth interspace, and an 8 mm robotic port was inserted.  The thoracoscope was advanced into the chest. After confirming intrapleural placement, carbon dioxide was insufflated per protocol.  A 12 mm port was placed in the eighth interspace anteriorly.   Intercostal nerve blocks then were performed from the third to the tenth interspace by injecting 10 mL of the bupivacaine solution into a subpleural plane at each level. A 12 mm AirSeal port was placed in the tenth interspace posterolaterally.  Two  additional eighth interspace ports were placed.  A 12 mm port was placed posterolaterally and an 8 mm port was placed posteriorly in the eighth interspace. The robot was deployed.  The camera arm was docked.  Targeting was performed.  The remaining arms  were docked.  Robotic instruments were inserted with thoracoscopic visualization.  Inspection of the right upper lobe showed the nodule was clearly visible.  There was some invagination of the visceral pleura.  A wedge resection was performed with sequential firings of the robotic stapler.  The specimen was placed into an endoscopic  retrieval bag, removed, and sent for frozen section.   While awaiting the results of the frozen section, the lymph node dissection was initiated.  The inferior ligament was divided.  A small level 9 node was identified and removed.  Working posteriorly,  the pleural reflection was divided at the hilum and a level 7 node was removed.  The dissection was carried into the area around the bifurcation of the upper lobe bronchus from the bronchus intermedius.  Only a very small node was present there.  The  upper lobe was retracted inferiorly.  The pleural reflection was divided over the mediastinum superior to the azygos vein and multiple relatively small 4R nodes were removed.  By this point, the frozen section returned showing non-small cell carcinoma.  Decision was made to proceed with a lobectomy as discussed with the patient preoperatively.  The lung was retracted posteriorly and the superior pulmonary vein was dissected  out.  The middle lobe branch was identified and preserved.  The upper lobe branches were encircled and divided with a vascular cartridge on the robotic stapler.  The fissure then was inspected and was incomplete with no clear plane to be developed.   The decision was made to divide the minor fissure, working from anterior to posterior with sequential firings of the robotic stapler.  This was carried to the level of the main pulmonary artery and then the dissection was carried along the main pulmonary  artery. Level 12 nodes were removed during this dissection as well as a level 10 node once the upper lobe branches had been dissected out. The anterior apical arterial trunk was identified.  It was encircled and divided with the robotic stapler and then after  additional lymph node removal the posterior ascending branch was identified.  It came off much more superiorly than normal.  It was encircled and divided with the robotic stapler.  The major fissure then was completed with sequential firings of the  Stapler.  Finally, a stapler was placed  across the right upper lobe bronchus at its origin and closed.  A test inflation showed good aeration of the lower and middle lobes.  The stapler was fired transecting the bronchus.  The chest was copiously irrigated with saline.  A test inflation showed no leakage from the bronchial stump.  The vessel loop and sponges that had been placed were removed.  The robotic instruments were removed.  The robot was undocked.  The anterior  eighth interspace incision was lengthened to 3 cm, the upper lobe was manipulated into a 12 mm endoscopic retrieval bag and then removed through the anterior incision. Because of the small peripheral nature of the nodule, frozen section of the bronchial  margin was not performed.  The middle lobe was concerning for possible torsion.  The decision was made to tack it to the lower lobe with a single firing of a Covidien stapler.  A 28-French Blake drain was placed through the original port incision and  secured with #1 silk suture.  Dual lung ventilation was resumed.  The incisions were closed in standard fashion.  Dermabond was applied.  The chest tube was placed to a Pleur-Evac on waterseal.  The patient was placed back in a supine position.  He  was  extubated in the operating room and taken to the postanesthetic care unit in good condition.  All sponge, needle and instrument counts were correct at the end of the procedure.  Experienced assistance was necessary for this case due to surgical complexity.  Dr. Romie Levee served as the assistant providing assistance with port placement, robot docking and undocking, instrument exchange, specimen retrieval, and wound closure.      SUJ D: 11/17/2021 3:48:12 pm T: 11/17/2021 10:58:00 pm  JOB: [id number]/ [id number]

## 2021-11-17 NOTE — Interval H&P Note (Signed)
History and Physical Interval Note:  Zubrod Score: At the time of surgery this patient's most appropriate activity status/level should be described as: []     0    Normal activity, no symptoms [x]     1    Restricted in physical strenuous activity but ambulatory, able to do out light work []     2    Ambulatory and capable of self care, unable to do work activities, up and about >50 % of waking hours                              []     3    Only limited self care, in bed greater than 50% of waking hours []     4    Completely disabled, no self care, confined to bed or chair []     5    Moribund  Clinical stage IA (T1N0) 11/17/2021 7:10 AM  Joseph Larson  has presented today for surgery, with the diagnosis of RUL nodule.  The various methods of treatment have been discussed with the patient and family. After consideration of risks, benefits and other options for treatment, the patient has consented to  Procedure(s): XI ROBOTIC ASSISTED THORACOSCOPY-RIGHT UPPER LOBE WEDGE RESECTION (Right) XI ROBOTIC ASSISTED THORACOSCOPY-POSSIBLE SEGMENTECTOMY VS LOBECTOMY (Right) as a surgical intervention.  The patient's history has been reviewed, patient examined, no change in status, stable for surgery.  I have reviewed the patient's chart and labs.  Questions were answered to the patient's satisfaction.     [name]

## 2021-11-17 NOTE — Hospital Course (Addendum)
History of Present Illness:  Joseph Larson is a 66 y.o. man with PMH smoking, COPD, HTN, HLD, CAD, GERD, arthritis, who was found to have an 84mm RUL nodule on screening CT 07/07/21. This was minimally enlarged to 52mm on 32mo f/up CT 10/02/21 and was found to be PET-avid with SUV 5.9. He has no evidence of nodal disease or distant spread. He did undergo brain MRI w/o evidence of metastasis.   He has otherwise been feeling generally well. He has had no recent weight loss. He has no exertional dyspnea or  chest pain. He exercises at the gym every other day, and occasionally uses his inhaler prior to exercise. He lives with his wife, who will be his primary support person post-op.   He does continue to smoke ~10 cigarettes/day. He has tried to quit multiple times previously and is contemplating another attempt, though is not optimistic.   He follows with a cardiologist for CAD diagnosed on chest CT, and pulmonologist in [place]. In July, he had some episodes of dizziness which were worked up with a carotid ultrasound without evidence of significant stenosis. He was found to have a cerumen impaction, the treatment of which improved his symptoms.  He was evaluated by Dr. Roxan Hockey who felt the nodule in the right upper lobe.  It was felt he would undergo Robotic assisted resection.  The risks and benefits of the procedure were explained to the patient and he was agreeable to proceed.    Hospital Course:  Joseph Larson presented to [facility] on 11/17/2021.  He was taken to the operating room and underwent Robotic Assisted Video Assisted Right Upper Lobe Wedge Resection, Completion Right Upper lobectomy, lymph node dissection and intercostal nerve block.  The patient tolerated the procedure, was extubated, and taken to the PACU in stable condition.  The patient underwent multiple failed attempts to place foley catheter in the operating room.  On arrival to the progressive care unit the patient was  unable to void.  Urology consult was obtained who placed foley catheter.  Patient was also started on Flomax.  His chest tube had an air leak with increase in pneumothorax on the right.  His chest tube was transitioned to suction.  The patient developed Atrial Fibrillation with RVR.  He was treated with Amiodarone drip protocol.  He also required IV Lopressor bolus to help achieve adequate HR control. He was started on oral regimen of Lopressor.  His chest tube was left on suction with improvement of pneumothorax.  He was placed back to water seal on 11/20/2021.  The patient's follow up CXR showed development of small pneumothorax.  His air leak persisted.  He was hypertensive and his lopressor dose was increased.  The patient again developed Atrial Fibrillation with RVR.  He was treated with IV Amiodarone bolus and drip therapy.  He converted back to NSR and his oral regimen of Amiodarone was continued.  His foley catheter was removed on 11/23/2021.  He was able to void without difficulty post removal.  Patient's chest tube was placed to a Mini Express on 11/24/2021.  Follow up CXR showed ***.  He is maintaining NSR.  HH has been arranged to assist with management of chest tube and dressing changes. He is ambulating independently.  His surgical incisions are healing without evidence of infection.  He is medically stable for discharge home today.

## 2021-11-17 NOTE — Anesthesia Procedure Notes (Addendum)
Procedure Name: Intubation Date/Time: 11/17/2021 7:46 AM  Performed by: Renato Shin, CRNAPre-anesthesia Checklist: Patient identified, Emergency Drugs available, Suction available and Patient being monitored Patient Re-evaluated:Patient Re-evaluated prior to induction Oxygen Delivery Method: Circle system utilized Preoxygenation: Pre-oxygenation with 100% oxygen Induction Type: IV induction Ventilation: Mask ventilation without difficulty Laryngoscope Size: Glidescope and 4 Grade View: Grade II Endobronchial tube: Left, Double lumen EBT, EBT position confirmed by auscultation and EBT position confirmed by fiberoptic bronchoscope and 39 Fr Number of attempts: 1 Airway Equipment and Method: Rigid stylet and Video-laryngoscopy Placement Confirmation: positive ETCO2, breath sounds checked- equal and bilateral and ETT inserted through vocal cords under direct vision Tube secured with: Tape Dental Injury: Teeth and Oropharynx as per pre-operative assessment  Comments: Recommend glidescope

## 2021-11-17 NOTE — TOC Progression Note (Signed)
Transition of Care [facility]) - Progression Note    Patient Details  Name: GENERAL WEARING MRN: [id number] Date of Birth: 06/22/1955  Transition of Care [facility]) CM/SW [place], RN Phone Number:[phone]  11/17/2021, 1:45 PM  Clinical Narrative:    Transition of Care [facility]) Screening Note   Patient Details  Name: NASEEM VARDEN Date of Birth: [birth year]   Transition of Care [facility]) CM/SW Contact:    Angelita Ingles, RN Phone Number: 11/17/2021, 1:45 PM    Transition of Care Department [facility]) has reviewed patient and no TOC needs have been identified at this time. We will continue to monitor patient advancement through interdisciplinary progression rounds.           Expected Discharge Plan and Services                                                 Social Determinants of Health (SDOH) Interventions    Readmission Risk Interventions     No data to display

## 2021-11-17 NOTE — Anesthesia Postprocedure Evaluation (Addendum)
Anesthesia Post Note  Patient: Joseph Larson  Procedure(s) Performed: XI ROBOTIC ASSISTED THORACOSCOPY-RIGHT UPPER LOBE WEDGE RESECTION (Right: Chest) XI ROBOTIC ASSISTED THORACOSCOPY WITH RIGHT UPPER LOBECTOMY (Right: Chest) INTERCOSTAL NERVE BLOCK (Right: Chest) LYMPH NODE DISSECTION (Right: Chest)     Patient location during evaluation: PACU Anesthesia Type: General Level of consciousness: awake and alert Pain management: pain level controlled Vital Signs Assessment: post-procedure vital signs reviewed and stable Respiratory status: spontaneous breathing, nonlabored ventilation, respiratory function stable and patient connected to nasal cannula oxygen Cardiovascular status: blood pressure returned to baseline and stable Postop Assessment: no apparent nausea or vomiting Anesthetic complications: no   Encounter Notable Events  Notable Event Outcome Phase Comment  Difficult to intubate - expected  Intraprocedure Filed from anesthesia note documentation.    Last Vitals:  Vitals:   11/17/21 1245 11/17/21 1300  BP: (!) 158/77 (!) 147/76  Pulse: 76 67  Resp: 17 16  Temp:  36.6 C  SpO2: 94% 98%    Last Pain:  Vitals:   11/17/21 1300  TempSrc:   PainSc: [place] Nadav Swindell

## 2021-11-17 NOTE — OR Nursing (Signed)
Multiple attempts made to insert foley catheter using a 44fr, 96fr, 31fr foley catheter. Also attempts made with a 15fr coude and 24fr coude by MD Vilma Prader Dr Roxan Hockey stated to abort catheter insertion.

## 2021-11-17 NOTE — Procedures (Signed)
Foley Catheter Placement Note  Indications: 66 y.o. male with COPD, HTN, HLD, ED, BPH/LUTS s/p HoLEP 06/2019 and right upper lobe nodule now s/p right robotic assisted upper lobectomy. During procedure, OR staff attempted ~5 times to place foley catheter but were unsuccessful. He experienced urinary retention post-operatively. Urology consulted for foley catheter placement.  Pre-operative Diagnosis: Urinary retention  Post-operative Diagnosis: Same  Surgeon: Josph Macho, MD  Assistants: None  Procedure Details  Patient was placed in the supine position, prepped with Betadine and draped in the usual sterile fashion.  We injected lidocaine jelly per urethra prior to the procedure.  We then attempted to insert an 18Fr coude catheter but met resistance at the prostate. I then attempted to place a 12Fr straight catheter but was still unsuccessful.  I obtained a flexible cystoscope and performed cystoscopy which was notable for normal anterior urethra. No prostatic hyperplasia noted. 8Fr bladder neck contracture was appreciated. Through the scope, I advanced a sensor wire into the bladder and removed the scope leaving the wire in place.  Using dilators, I dilated the bladder neck from 8Fr to 18Fr over the sensor wire. I then attempted to advance a 16Fr council tip catheter but could not advance it past the bladder neck.  So the catheter could advance smoothly, I advanced a dilator into the bladder over the sensor wire and then exchanged the sensor wire for a glide wire. I then removed the dilator leaving the glide wire in place. I attempted to advance the 16Fr council tip catheter over the wire again and still was unsuccessful. I then blitzed a 12Fr catheter over the wire. It advanced smoothly into the bladder. We achieved return of clear yellow urine and then proceeded to insert 10 mL of sterile water into the Foley balloon.  The catheter was attached to a drainage bag and secured with a  StatLock.  Placement of the catheter had return of greater than 500 mL of clear yellow urine.               Complications: None; patient tolerated the procedure well.  Plan:   1.  Continue Foley catheter to drainage at this time.  2. Reasonable to remove catheter for trial of void on day of discharge (2-3 days per primary team). 3. Patient is followed by [facility]. He will set up follow up appointment with his local urologist.  Please page the urology team with any further questions or concerns.  Josph Macho, MD

## 2021-11-17 NOTE — Anesthesia Procedure Notes (Signed)
Arterial Line Insertion Start/End8/25/2023 7:20 AM, 11/17/2021 7:25 AM Performed by: Effie Berkshire, MD, anesthesiologist  Patient location: Pre-op. Preanesthetic checklist: patient identified, IV checked, site marked, risks and benefits discussed, surgical consent, monitors and equipment checked, pre-op evaluation, timeout performed and anesthesia consent Lidocaine 1% used for infiltration Right, radial was placed Catheter size: 20 G Hand hygiene performed  and maximum sterile barriers used   Attempts: 2 Procedure performed without using ultrasound guided technique. Following insertion, dressing applied and Biopatch. Post procedure assessment: normal and unchanged  Post procedure complications: second provider assisted. Patient tolerated the procedure well with no immediate complications. Additional procedure comments: CRNA x1, MDA x1.

## 2021-11-18 ENCOUNTER — Encounter (HOSPITAL_COMMUNITY): Payer: Self-pay | Admitting: Thoracic Surgery (Cardiothoracic Vascular Surgery)

## 2021-11-18 ENCOUNTER — Inpatient Hospital Stay (HOSPITAL_COMMUNITY): Payer: Medicare Other

## 2021-11-18 LAB — CBC
HCT: 35.7 % — ABNORMAL LOW (ref 39.0–52.0)
Hemoglobin: 12.2 g/dL — ABNORMAL LOW (ref 13.0–17.0)
MCH: 31.4 pg (ref 26.0–34.0)
MCHC: 34.2 g/dL (ref 30.0–36.0)
MCV: 91.8 fL (ref 80.0–100.0)
Platelets: 146 K/uL — ABNORMAL LOW (ref 150–400)
RBC: 3.89 MIL/uL — ABNORMAL LOW (ref 4.22–5.81)
RDW: 11.8 % (ref 11.5–15.5)
WBC: 13.1 K/uL — ABNORMAL HIGH (ref 4.0–10.5)
nRBC: 0 % (ref 0.0–0.2)

## 2021-11-18 LAB — BASIC METABOLIC PANEL
Anion gap: 6 (ref 5–15)
BUN: 16 mg/dL (ref 8–23)
CO2: 25 mmol/L (ref 22–32)
Calcium: 8.1 mg/dL — ABNORMAL LOW (ref 8.9–10.3)
Chloride: 105 mmol/L (ref 98–111)
Creatinine, Ser: 1.07 mg/dL (ref 0.61–1.24)
GFR, Estimated: >60 mL/min (ref >60)
Glucose, Bld: 130 mg/dL — ABNORMAL HIGH (ref 70–99)
Potassium: 4.3 mmol/L (ref 3.5–5.1)
Sodium: 136 mmol/L (ref 135–145)

## 2021-11-18 LAB — GLUCOSE, CAPILLARY
Glucose-Capillary: 135 mg/dL — ABNORMAL HIGH (ref 70–99)
Glucose-Capillary: 134 mg/dL — ABNORMAL HIGH (ref 70–99)
Glucose-Capillary: 156 mg/dL — ABNORMAL HIGH (ref 70–99)
Glucose-Capillary: 132 mg/dL — ABNORMAL HIGH (ref 70–99)

## 2021-11-18 MED ORDER — AMIODARONE HCL IN DEXTROSE 360-4.14 MG/200ML-% IV SOLN
60.0000 mg/h | INTRAVENOUS | Status: AC
  Administered 2021-11-18 (×2): 60 mg/h via INTRAVENOUS
  Filled 2021-11-18: qty 200

## 2021-11-18 MED ORDER — OXYCODONE HCL 5 MG PO TABS
10.0000 mg | ORAL_TABLET | ORAL | Status: DC | PRN
  Administered 2021-11-18 (×2): 15 mg via ORAL
  Administered 2021-11-19 (×2): 10 mg via ORAL
  Administered 2021-11-20: 15 mg via ORAL
  Filled 2021-11-18 (×4): qty 3
  Filled 2021-11-18 (×2): qty 2

## 2021-11-18 MED ORDER — LORATADINE 10 MG PO TABS
10.0000 mg | ORAL_TABLET | Freq: Every day | ORAL | Status: DC
  Administered 2021-11-18 – 2021-11-25 (×8): 10 mg via ORAL
  Filled 2021-11-18 (×7): qty 1

## 2021-11-18 MED ORDER — AMIODARONE IV BOLUS ONLY 150 MG/100ML
150.0000 mg | Freq: Once | INTRAVENOUS | Status: AC
  Filled 2021-11-18: qty 100

## 2021-11-18 MED ORDER — GUAIFENESIN ER 600 MG PO TB12
600.0000 mg | ORAL_TABLET | Freq: Two times a day (BID) | ORAL | Status: DC
  Administered 2021-11-18 – 2021-11-25 (×15): 600 mg via ORAL
  Filled 2021-11-18 (×15): qty 1

## 2021-11-18 MED ORDER — AMIODARONE HCL IN DEXTROSE 360-4.14 MG/200ML-% IV SOLN
INTRAVENOUS | Status: AC
  Filled 2021-11-18: qty 200

## 2021-11-18 MED ORDER — HYDROCOD POLI-CHLORPHE POLI ER 10-8 MG/5ML PO SUER: 5.0000 mL | Freq: Four times a day (QID) | ORAL | Status: DC | PRN

## 2021-11-18 MED ORDER — BISOPROLOL-HYDROCHLOROTHIAZIDE 5-6.25 MG PO TABS
1.0000 | ORAL_TABLET | Freq: Every day | ORAL | Status: DC
  Filled 2021-11-18: qty 1

## 2021-11-18 MED ORDER — METOPROLOL TARTRATE 5 MG/5ML IV SOLN
5.0000 mg | Freq: Once | INTRAVENOUS | Status: AC
Start: 2021-11-18 — End: 2021-11-18
  Administered 2021-11-18: 5 mg via INTRAVENOUS
  Filled 2021-11-18: qty 5

## 2021-11-18 MED ORDER — AMIODARONE HCL IN DEXTROSE 360-4.14 MG/200ML-% IV SOLN
30.0000 mg/h | INTRAVENOUS | Status: DC
  Administered 2021-11-18 – 2021-11-20 (×4): 30 mg/h via INTRAVENOUS
  Filled 2021-11-18 (×3): qty 200

## 2021-11-18 MED ORDER — HYDROCOD POLI-CHLORPHE POLI ER 10-8 MG/5ML PO SUER
5.0000 mL | Freq: Three times a day (TID) | ORAL | Status: DC | PRN
  Administered 2021-11-21 – 2021-11-25 (×7): 5 mL via ORAL
  Filled 2021-11-18 (×7): qty 5

## 2021-11-18 NOTE — Progress Notes (Addendum)
      [address]       [place] [address]             [phone]      1 Day Post-Op Procedure(s) (LRB): XI ROBOTIC ASSISTED THORACOSCOPY-RIGHT UPPER LOBE WEDGE RESECTION (Right) XI ROBOTIC ASSISTED THORACOSCOPY WITH RIGHT UPPER LOBECTOMY (Right) INTERCOSTAL NERVE BLOCK (Right) LYMPH NODE DISSECTION (Right)  Subjective:  Patient sitting up in chair.  Visibly uncomfortable.  Having issues with pain getting some relief  Objective: Vital signs in last 24 hours: Temp:  [97 F (36.1 C)-98.2 F (36.8 C)] 98.1 F (36.7 C) (08/26 0743) Pulse Rate:  [60-76] 61 (08/26 0405) Cardiac Rhythm: Sinus bradycardia (08/26 0442) Resp:  [11-20] 14 (08/26 0743) BP: (121-168)/(68-83) 140/77 (08/26 0743) SpO2:  [94 %-98 %] 95 % (08/26 0743)  Intake/Output from previous day: 08/25 0701 - 08/26 0700 In: 1775.4 [I.V.:1675.4; IV Piggyback:100] Out: 1170 [Urine:950; Chest Tube:220]  General appearance: alert, cooperative, and no distress Heart: regular rate and rhythm Lungs: clear to auscultation bilaterally Abdomen: soft, non-tender; bowel sounds normal; no masses,  no organomegaly Extremities: extremities normal, atraumatic, no cyanosis or edema Wound: clean and dry  Lab Results: Recent Labs    11/18/21 0530  WBC 13.1*  HGB 12.2*  HCT 35.7*  PLT 146*   BMET:  Recent Labs    11/18/21 0530  NA 136  K 4.3  CL 105  CO2 25  GLUCOSE 130*  BUN 16  CREATININE 1.07  CALCIUM 8.1*    PT/INR: No results for input(s): "LABPROT", "INR" in the last 72 hours. ABG    Component Value Date/Time   PHART 7.41 11/15/2021 1010   HCO3 26.0 11/15/2021 1010   O2SAT 98.9 11/15/2021 1010   CBG (last 3)  Recent Labs    11/17/21 1650 11/17/21 2122 11/18/21 0610  GLUCAP 124* 165* 135*    Assessment/Plan: S/P Procedure(s) (LRB): XI ROBOTIC ASSISTED THORACOSCOPY-RIGHT UPPER LOBE WEDGE RESECTION (Right) XI ROBOTIC ASSISTED THORACOSCOPY WITH RIGHT UPPER LOBECTOMY  (Right) INTERCOSTAL NERVE BLOCK (Right) LYMPH NODE DISSECTION (Right)  CV- NSR, + Hypertensive this morning- suspect this is pain mediated.. per patient uses Bisoprolol/HCTZ due to Mobic raising his BP will monitor for now Pulm- CXR with new pneuomothorax, sub q emphysema.. CXR with air leak, CT is ordered to suction, repeat CXR in AM D/C Central Line GU- foley placed by urology, leave in place for now, continue FLomax, will require follow up in [place] for DVT prophylaxis Pain control- patient not opiod niave as he takes Norco at home, will increase Oxycodone to see if this helps   LOS: 1 day    Ellwood Handler, PA-C 11/18/2021  Patient examined and today's chest x-ray reviewed Minimal right pneumothorax on x-ray and moderate air leak on exam.  We will place chest tube to suction.  Augmentin pain control with Toradol, Tussionex and Mucinex for constant coughing.  patient examined and medical record reviewed,agree with above note. Dahlia Byes 11/18/2021

## 2021-11-18 NOTE — Progress Notes (Signed)
Pt still in A.fib RVR HR 120-130. BP 147/90. Pt c/p of hot flashes. Junie Panning, [place] notified. Verbal order given lopressor 5mg  IV.

## 2021-11-18 NOTE — Progress Notes (Signed)
Pt went into A-fib RVR 30 min after his afternoon walk. C/p of heart racing. EKG confirmed. Junie Panning, [place] notified. Amiodarone protocol started.  BP: 163/97, placed pt on 2L o2

## 2021-11-19 ENCOUNTER — Inpatient Hospital Stay (HOSPITAL_COMMUNITY): Payer: Medicare Other

## 2021-11-19 LAB — COMPREHENSIVE METABOLIC PANEL
ALT: 19 U/L (ref 0–44)
AST: 30 U/L (ref 15–41)
Albumin: 3.1 g/dL — ABNORMAL LOW (ref 3.5–5.0)
Alkaline Phosphatase: 59 U/L (ref 38–126)
Anion gap: 5 (ref 5–15)
BUN: 17 mg/dL (ref 8–23)
CO2: 27 mmol/L (ref 22–32)
Calcium: 7.9 mg/dL — ABNORMAL LOW (ref 8.9–10.3)
Chloride: 106 mmol/L (ref 98–111)
Creatinine, Ser: 1.08 mg/dL (ref 0.61–1.24)
GFR, Estimated: >60 mL/min (ref >60)
Glucose, Bld: 99 mg/dL (ref 70–99)
Potassium: 4.2 mmol/L (ref 3.5–5.1)
Sodium: 138 mmol/L (ref 135–145)
Total Bilirubin: 0.6 mg/dL (ref 0.3–1.2)
Total Protein: 5.6 g/dL — ABNORMAL LOW (ref 6.5–8.1)

## 2021-11-19 LAB — CBC
HCT: 38.8 % — ABNORMAL LOW (ref 39.0–52.0)
Hemoglobin: 12.8 g/dL — ABNORMAL LOW (ref 13.0–17.0)
MCH: 31.1 pg (ref 26.0–34.0)
MCHC: 33 g/dL (ref 30.0–36.0)
MCV: 94.4 fL (ref 80.0–100.0)
Platelets: 134 10*3/uL — ABNORMAL LOW (ref 150–400)
RBC: 4.11 MIL/uL — ABNORMAL LOW (ref 4.22–5.81)
RDW: 11.9 % (ref 11.5–15.5)
WBC: 9.6 10*3/uL (ref 4.0–10.5)
nRBC: 0 % (ref 0.0–0.2)

## 2021-11-19 LAB — GLUCOSE, CAPILLARY
Glucose-Capillary: 104 mg/dL — ABNORMAL HIGH (ref 70–99)
Glucose-Capillary: 158 mg/dL — ABNORMAL HIGH (ref 70–99)

## 2021-11-19 LAB — MAGNESIUM: Magnesium: 1.9 mg/dL (ref 1.7–2.4)

## 2021-11-19 MED ORDER — KETOROLAC TROMETHAMINE 15 MG/ML IJ SOLN
15.0000 mg | Freq: Four times a day (QID) | INTRAMUSCULAR | Status: AC | PRN
  Administered 2021-11-19 – 2021-11-20 (×4): 15 mg via INTRAVENOUS
  Filled 2021-11-19 (×5): qty 1

## 2021-11-19 MED ORDER — METOPROLOL TARTRATE 12.5 MG HALF TABLET
12.5000 mg | ORAL_TABLET | Freq: Two times a day (BID) | ORAL | Status: DC
  Administered 2021-11-19 – 2021-11-20 (×4): 12.5 mg via ORAL
  Filled 2021-11-19 (×4): qty 1

## 2021-11-19 MED ORDER — KETOROLAC TROMETHAMINE 15 MG/ML IJ SOLN: 15.0000 mg | Freq: Four times a day (QID) | INTRAMUSCULAR | Status: DC

## 2021-11-19 NOTE — Progress Notes (Addendum)
      [address]       [place] [address]             [phone]       2 Days Post-Op Procedure(s) (LRB): XI ROBOTIC ASSISTED THORACOSCOPY-RIGHT UPPER LOBE WEDGE RESECTION (Right) XI ROBOTIC ASSISTED THORACOSCOPY WITH RIGHT UPPER LOBECTOMY (Right) INTERCOSTAL NERVE BLOCK (Right) LYMPH NODE DISSECTION (Right)  Subjective:  Patient's laying in bed.  HR remains elevated.  They ask about ambulating with elevated HR.  Instructed they can not walk while he is in Atrial Fibrillation with elevated HR  Objective: Vital signs in last 24 hours: Temp:  [97.5 F (36.4 C)-98.6 F (37 C)] 97.6 F (36.4 C) (08/27 0802) Pulse Rate:  [101-123] 123 (08/27 1123) Cardiac Rhythm: Sinus tachycardia (08/27 0700) Resp:  [13-20] 18 (08/27 0802) BP: (109-163)/(66-97) 142/89 (08/27 1123) SpO2:  [93 %-95 %] 93 % (08/27 0802)  Intake/Output from previous day: 08/26 0701 - 08/27 0700 In: 519.8 [P.O.:240; I.V.:279.8] Out: 1520 [Urine:1450; Chest Tube:70] Intake/Output this shift: Total I/O In: -  Out: 200 [Urine:200]  General appearance: alert, cooperative, and no distress Heart: irregularly irregular rhythm Lungs: clear to auscultation bilaterally Abdomen: soft, non-tender; bowel sounds normal; no masses,  no organomegaly Extremities: extremities normal, atraumatic, no cyanosis or edema Wound: clean and dry  Lab Results: Recent Labs    11/18/21 0530 11/19/21 0516  WBC 13.1* 9.6  HGB 12.2* 12.8*  HCT 35.7* 38.8*  PLT 146* 134*   BMET:  Recent Labs    11/18/21 0530 11/19/21 0516  NA 136 138  K 4.3 4.2  CL 105 106  CO2 25 27  GLUCOSE 130* 99  BUN 16 17  CREATININE 1.07 1.08  CALCIUM 8.1* 7.9*    PT/INR: No results for input(s): "LABPROT", "INR" in the last 72 hours. ABG    Component Value Date/Time   PHART 7.41 11/15/2021 1010   HCO3 26.0 11/15/2021 1010   O2SAT 98.9 11/15/2021 1010   CBG (last 3)  Recent Labs    11/18/21 2139 11/19/21 0630  11/19/21 1133  GLUCAP 132* 104* 158*    Assessment/Plan: S/P Procedure(s) (LRB): XI ROBOTIC ASSISTED THORACOSCOPY-RIGHT UPPER LOBE WEDGE RESECTION (Right) XI ROBOTIC ASSISTED THORACOSCOPY WITH RIGHT UPPER LOBECTOMY (Right) INTERCOSTAL NERVE BLOCK (Right) LYMPH NODE DISSECTION (Right)  CV- Atrial Fibrillation with RVR- on IV Amiodarone drip will continue today, start Lopressor 12.5 mg BID.Marland Kitchen if HR remains elevated will re-bolus this afternoon Pulm- CXR with improvement of pneumothorax on CXR, sub q air is slightly increased, will leave chest tube to suction today Renal- creatinine remains stable, Potassium level is normal, will check MG level if low will supplement GU- urinary retention foley in place, will need outpatient follow up with Urology Lovenox for DVT prophylaxis. Dispo- Atrial Fibrillation persists, continue Amiodarone, Lopressor started.. will give additional amiodarone boluses as needed, leave chest tube to suction, repeat CXR in AM   LOS: 2 days    Ellwood Handler, PA-C 11/19/2021   patient examined and medical record reviewed,agree with above note. Dahlia Byes 11/19/2021

## 2021-11-19 NOTE — Plan of Care (Signed)
  Problem: Education: Goal: Knowledge of General Education information will improve Description: Including pain rating scale, medication(s)/side effects and non-pharmacologic comfort measures Outcome: Progressing   Problem: Health Behavior/Discharge Planning: Goal: Ability to manage health-related needs will improve Outcome: Progressing   Problem: Clinical Measurements: Goal: Ability to maintain clinical measurements within normal limits will improve Outcome: Progressing Goal: Will remain free from infection Outcome: Progressing Goal: Cardiovascular complication will be avoided Outcome: Progressing   Problem: Activity: Goal: Risk for activity intolerance will decrease Outcome: Progressing   Problem: Pain Managment: Goal: General experience of comfort will improve Outcome: Progressing   Problem: Safety: Goal: Ability to remain free from injury will improve Outcome: Progressing   Problem: Skin Integrity: Goal: Risk for impaired skin integrity will decrease Outcome: Progressing

## 2021-11-19 NOTE — Plan of Care (Signed)
  Problem: Clinical Measurements: Goal: Respiratory complications will improve Outcome: Progressing   Problem: Nutrition: Goal: Adequate nutrition will be maintained Outcome: Progressing   Problem: Coping: Goal: Level of anxiety will decrease Outcome: Progressing   Problem: Clinical Measurements: Goal: Cardiovascular complication will be avoided Outcome: Not Progressing   Problem: Activity: Goal: Risk for activity intolerance will decrease Outcome: Not Progressing   Problem: Pain Managment: Goal: General experience of comfort will improve Outcome: Not Progressing

## 2021-11-20 ENCOUNTER — Inpatient Hospital Stay (HOSPITAL_COMMUNITY): Payer: Medicare Other

## 2021-11-20 MED ORDER — AMIODARONE HCL 200 MG PO TABS
200.0000 mg | ORAL_TABLET | Freq: Two times a day (BID) | ORAL | Status: DC
Start: 2021-11-20 — End: 2021-11-25
  Administered 2021-11-20 – 2021-11-25 (×10): 200 mg via ORAL
  Filled 2021-11-20 (×11): qty 1

## 2021-11-20 MED ORDER — ORAL CARE MOUTH RINSE
15.0000 mL | OROMUCOSAL | Status: DC | PRN
Start: 2021-11-20 — End: 2021-11-25

## 2021-11-20 MED ORDER — MAGNESIUM OXIDE -MG SUPPLEMENT 400 (240 MG) MG PO TABS
400.0000 mg | ORAL_TABLET | Freq: Two times a day (BID) | ORAL | Status: AC
  Administered 2021-11-20 – 2021-11-22 (×6): 400 mg via ORAL
  Filled 2021-11-20 (×6): qty 1

## 2021-11-20 MED ORDER — LACTULOSE 10 GM/15ML PO SOLN: 20.0000 g | Freq: Every day | ORAL | Status: DC | PRN

## 2021-11-20 NOTE — Progress Notes (Signed)
Mobility Specialist - Progress Note   11/20/21 1651  Mobility  Activity Ambulated with assistance in hallway  Level of Assistance Standby assist, set-up cues, supervision of patient - no hands on  Assistive Device Front wheel walker  Distance Ambulated (ft) 420 ft  Activity Response Tolerated well  $Mobility charge 1 Mobility    Pre-mobility:84 HR,127/77 BP,96% SpO2 During mobility: 96% SpO2 Post-mobility: 80 HR,148/65 BP, 96% SpO2  Pt was received in chair and agreeable to mobility.No c/o pain throughout ambulation.Upon return to room pt c/o "pinching" pain at incision sight. Pt was left back in chair with all needs met and RN notified.  Amaya Reives  Mobility Specialist  

## 2021-11-20 NOTE — Progress Notes (Signed)
      [address]       [place] [address]             [phone]       3 Days Post-Op Procedure(s) (LRB): XI ROBOTIC ASSISTED THORACOSCOPY-RIGHT UPPER LOBE WEDGE RESECTION (Right) XI ROBOTIC ASSISTED THORACOSCOPY WITH RIGHT UPPER LOBECTOMY (Right) INTERCOSTAL NERVE BLOCK (Right) LYMPH NODE DISSECTION (Right)  Subjective:  Having a lot of pain along his right clavicle/scapula area.  Some nausea yesterday.  No BM yet  Objective: Vital signs in last 24 hours: Temp:  [97.6 F (36.4 C)-98.1 F (36.7 C)] 98.1 F (36.7 C) (08/28 0429) Pulse Rate:  [67-123] 71 (08/28 0429) Cardiac Rhythm: Normal sinus rhythm (08/27 2018) Resp:  [11-18] 13 (08/28 0429) BP: (109-142)/(75-99) 132/75 (08/28 0429) SpO2:  [93 %-95 %] 95 % (08/28 0429)  Intake/Output from previous day: 08/27 0701 - 08/28 0700 In: 337 [I.V.:337] Out: 1040 [Urine:900; Chest Tube:140] Intake/Output this shift: Total I/O In: 80.5 [I.V.:80.5] Out: 40 [Chest Tube:40]  General appearance: cooperative and no distress Heart: regular rate and rhythm Lungs: clear to auscultation bilaterally Abdomen: soft, non-tender; bowel sounds normal; no masses,  no organomegaly Extremities: extremities normal, atraumatic, no cyanosis or edema Wound: clean and dry  Lab Results: Recent Labs    11/18/21 0530 11/19/21 0516  WBC 13.1* 9.6  HGB 12.2* 12.8*  HCT 35.7* 38.8*  PLT 146* 134*   BMET:  Recent Labs    11/18/21 0530 11/19/21 0516  NA 136 138  K 4.3 4.2  CL 105 106  CO2 25 27  GLUCOSE 130* 99  BUN 16 17  CREATININE 1.07 1.08  CALCIUM 8.1* 7.9*    PT/INR: No results for input(s): "LABPROT", "INR" in the last 72 hours. ABG    Component Value Date/Time   PHART 7.41 11/15/2021 1010   HCO3 26.0 11/15/2021 1010   O2SAT 98.9 11/15/2021 1010   CBG (last 3)  Recent Labs    11/18/21 2139 11/19/21 0630 11/19/21 1133  GLUCAP 132* 104* 158*    Assessment/Plan: S/P Procedure(s) (LRB): XI  ROBOTIC ASSISTED THORACOSCOPY-RIGHT UPPER LOBE WEDGE RESECTION (Right) XI ROBOTIC ASSISTED THORACOSCOPY WITH RIGHT UPPER LOBECTOMY (Right) INTERCOSTAL NERVE BLOCK (Right) LYMPH NODE DISSECTION (Right)  CV- Atrial Fibrillation, converted to NSR yesterday evening.. will stop IV amiodarone, start oral at 200 mg BID, continue Lopressor Pulm- CXR is stable with some sub q emphysema, no pneumothorax, will place to water seal today Renal- creatinine has been stable, Magnesium level was borderline at 1.9, will give Mag ox for 3 days GU- urinary retention, foley in place, acceptable U/O continue Flomax GI- no BM yet add Lactulose prn Continue Lovenox for DVT prophylaxis Dispo- patient stable, converted to NSR will transition to oral Amiodarone, will place chest tube to water seal, repeat CXR in AM   LOS: 3 days    Ellwood Handler, PA-C 11/20/2021

## 2021-11-21 ENCOUNTER — Inpatient Hospital Stay (HOSPITAL_COMMUNITY): Payer: Medicare Other

## 2021-11-21 LAB — SURGICAL PATHOLOGY

## 2021-11-21 MED ORDER — METOPROLOL TARTRATE 25 MG PO TABS
25.0000 mg | ORAL_TABLET | Freq: Two times a day (BID) | ORAL | Status: DC
  Administered 2021-11-21 – 2021-11-25 (×9): 25 mg via ORAL
  Filled 2021-11-21 (×9): qty 1

## 2021-11-21 MED ORDER — LACTULOSE 10 GM/15ML PO SOLN
20.0000 g | Freq: Once | ORAL | Status: AC
  Administered 2021-11-21: 20 g via ORAL
  Filled 2021-11-21: qty 30

## 2021-11-21 NOTE — Plan of Care (Signed)

## 2021-11-21 NOTE — Progress Notes (Signed)
      [address]       [place] [address]             [phone]      4 Days Post-Op Procedure(s) (LRB): XI ROBOTIC ASSISTED THORACOSCOPY-RIGHT UPPER LOBE WEDGE RESECTION (Right) XI ROBOTIC ASSISTED THORACOSCOPY WITH RIGHT UPPER LOBECTOMY (Right) INTERCOSTAL NERVE BLOCK (Right) LYMPH NODE DISSECTION (Right)  Subjective:  Patient uncomfortable.  Feels like he needs to move his bowels and is not able.  Coughing up good amount of sputum  Objective: Vital signs in last 24 hours: Temp:  [97.9 F (36.6 C)-98.1 F (36.7 C)] 98.1 F (36.7 C) (08/29 0431) Cardiac Rhythm: Normal sinus rhythm (08/29 0726) Resp:  [15-19] 15 (08/29 0431) BP: (137-164)/(73-82) 164/82 (08/29 0431) SpO2:  [94 %-97 %] 95 % (08/29 0431)  Intake/Output from previous day: 08/28 0701 - 08/29 0700 In: 960 [P.O.:960] Out: 1850 [Urine:1750; Chest Tube:100]  General appearance: alert, cooperative, and no distress Heart: regular rate and rhythm Lungs: clear to auscultation bilaterally Abdomen: soft, non-tender; bowel sounds normal; no masses,  no organomegaly Extremities: extremities normal, atraumatic, no cyanosis or edema Wound: clean and dry  Lab Results: Recent Labs    11/19/21 0516  WBC 9.6  HGB 12.8*  HCT 38.8*  PLT 134*   BMET:  Recent Labs    11/19/21 0516  NA 138  K 4.2  CL 106  CO2 27  GLUCOSE 99  BUN 17  CREATININE 1.08  CALCIUM 7.9*    PT/INR: No results for input(s): "LABPROT", "INR" in the last 72 hours. ABG    Component Value Date/Time   PHART 7.41 11/15/2021 1010   HCO3 26.0 11/15/2021 1010   O2SAT 98.9 11/15/2021 1010   CBG (last 3)  Recent Labs    11/18/21 2139 11/19/21 0630 11/19/21 1133  GLUCAP 132* 104* 158*    Assessment/Plan: S/P Procedure(s) (LRB): XI ROBOTIC ASSISTED THORACOSCOPY-RIGHT UPPER LOBE WEDGE RESECTION (Right) XI ROBOTIC ASSISTED THORACOSCOPY WITH RIGHT UPPER LOBECTOMY (Right) INTERCOSTAL NERVE BLOCK (Right) LYMPH NODE  DISSECTION (Right)  CV- maintaining NSR, + HTN- will increase Lopressor to 12.5 mg BID Pulm- CT placed to water seal yesterday, + large air leak, CXR with pneumothorax... will continue water seal today if lung collapses further will need to change back to suction GU- urinary retention, patient asking about foley removal, will continue Flomax, leave foley in place for now D/C Central line Gi- lactulose for constipation Lovenox for DVT  Dispo- patient stable, will leave chest tube to water seal today, titrate BB, leave foley in place   LOS: 4 days   Ellwood Handler, PA-C 11/21/2021  Agree with above Continue pulm toilet Transitioning to [place]

## 2021-11-21 NOTE — Care Management Important Message (Signed)
Important Message  Patient Details  Name: Joseph Larson MRN: [id number] Date of Birth: 22-Aug-1955   Medicare Important Message Given:  Yes     Orbie Pyo 11/21/2021, 3:31 PM

## 2021-11-21 NOTE — Progress Notes (Signed)
Mobility Specialist Progress Note    11/21/21 1501  Mobility  Activity Ambulated independently in hallway  Level of Assistance Modified independent, requires aide device or extra time  Assistive Device Front wheel walker  Distance Ambulated (ft) 420 ft  Activity Response Tolerated well  $Mobility charge 1 Mobility   During Mobility: 95% SpO2  Pt received in chair and agreeable. C/o nausea. Returned to BR to attempt BM.  Hildred Alamin Mobility Specialist

## 2021-11-21 NOTE — Progress Notes (Signed)
Radiology called this RN with results from chest x-ray showing a Right pneumothorax, I paged PA Gold with results at 0900.

## 2021-11-22 ENCOUNTER — Inpatient Hospital Stay (HOSPITAL_COMMUNITY): Payer: Medicare Other

## 2021-11-22 LAB — GLUCOSE, CAPILLARY
Glucose-Capillary: 130 mg/dL — ABNORMAL HIGH (ref 70–99)
Glucose-Capillary: 96 mg/dL (ref 70–99)
Glucose-Capillary: 136 mg/dL — ABNORMAL HIGH (ref 70–99)

## 2021-11-22 LAB — BASIC METABOLIC PANEL
Anion gap: 9 (ref 5–15)
BUN: 10 mg/dL (ref 8–23)
CO2: 27 mmol/L (ref 22–32)
Calcium: 8.5 mg/dL — ABNORMAL LOW (ref 8.9–10.3)
Chloride: 101 mmol/L (ref 98–111)
Creatinine, Ser: 0.95 mg/dL (ref 0.61–1.24)
GFR, Estimated: >60 mL/min (ref >60)
Glucose, Bld: 101 mg/dL — ABNORMAL HIGH (ref 70–99)
Potassium: 4.1 mmol/L (ref 3.5–5.1)
Sodium: 137 mmol/L (ref 135–145)

## 2021-11-22 MED ORDER — ACETAMINOPHEN 500 MG PO TABS
1000.0000 mg | ORAL_TABLET | Freq: Four times a day (QID) | ORAL | Status: DC
  Administered 2021-11-22 – 2021-11-25 (×12): 1000 mg via ORAL
  Filled 2021-11-22 (×12): qty 2

## 2021-11-22 MED ORDER — METOPROLOL TARTRATE 5 MG/5ML IV SOLN
5.0000 mg | Freq: Once | INTRAVENOUS | Status: AC
  Administered 2021-11-22: 5 mg via INTRAVENOUS
  Filled 2021-11-22: qty 5

## 2021-11-22 MED ORDER — AMIODARONE HCL IN DEXTROSE 360-4.14 MG/200ML-% IV SOLN
60.0000 mg/h | INTRAVENOUS | Status: AC
  Administered 2021-11-22 (×2): 60 mg/h via INTRAVENOUS
  Filled 2021-11-22: qty 200

## 2021-11-22 MED ORDER — AMIODARONE LOAD VIA INFUSION
150.0000 mg | Freq: Once | INTRAVENOUS | Status: AC
  Administered 2021-11-22: 150 mg via INTRAVENOUS
  Filled 2021-11-22: qty 83.34

## 2021-11-22 MED ORDER — BISACODYL 5 MG PO TBEC: 10.0000 mg | DELAYED_RELEASE_TABLET | Freq: Every day | ORAL | Status: DC | PRN

## 2021-11-22 MED ORDER — ACETAMINOPHEN 160 MG/5ML PO SOLN: 1000.0000 mg | Freq: Four times a day (QID) | ORAL | Status: DC

## 2021-11-22 MED ORDER — SENNOSIDES-DOCUSATE SODIUM 8.6-50 MG PO TABS: 1.0000 | ORAL_TABLET | Freq: Every evening | ORAL | Status: DC | PRN

## 2021-11-22 MED ORDER — AMIODARONE HCL IN DEXTROSE 360-4.14 MG/200ML-% IV SOLN
30.0000 mg/h | INTRAVENOUS | Status: DC
  Administered 2021-11-22 – 2021-11-23 (×3): 30 mg/h via INTRAVENOUS
  Filled 2021-11-22 (×3): qty 200

## 2021-11-22 MED ORDER — FLUTICASONE PROPIONATE 50 MCG/ACT NA SUSP
1.0000 | Freq: Every day | NASAL | Status: DC
  Administered 2021-11-22 – 2021-11-25 (×4): 1 via NASAL
  Filled 2021-11-22: qty 16

## 2021-11-22 NOTE — Plan of Care (Signed)
  Problem: Clinical Measurements: Goal: Respiratory complications will improve Outcome: Progressing Goal: Cardiovascular complication will be avoided Outcome: Progressing   Problem: Activity: Goal: Risk for activity intolerance will decrease Outcome: Progressing   Problem: Pain Managment: Goal: General experience of comfort will improve Outcome: Progressing   Problem: Coping: Goal: Level of anxiety will decrease Outcome: Not Progressing

## 2021-11-22 NOTE — Progress Notes (Signed)
Soon after [facility] PA Erin left the room patient went into afib RVR 150s. Patient denied palpitations and Shortness of breath. BP 162/100.  Paged APP, got orders from Dr. Kipp Brood to restart patient on IV amiodarone. Will get EKG and continue to monitor patient.

## 2021-11-22 NOTE — Progress Notes (Signed)
      [address]       [place] [address]             [phone]       5 Days Post-Op Procedure(s) (LRB): XI ROBOTIC ASSISTED THORACOSCOPY-RIGHT UPPER LOBE WEDGE RESECTION (Right) XI ROBOTIC ASSISTED THORACOSCOPY WITH RIGHT UPPER LOBECTOMY (Right) INTERCOSTAL NERVE BLOCK (Right) LYMPH NODE DISSECTION (Right)  Subjective:  Patient sitting up in chair.  Continues to have nausea at times.  + sputum production.  Nurse called and patient has redeveloped Atrial Fibrillation RVR  Objective: Vital signs in last 24 hours: Temp:  [97.8 F (36.6 C)-98.4 F (36.9 C)] 98.4 F (36.9 C) (08/30 0335) Cardiac Rhythm: Supraventricular tachycardia;Sinus tachycardia (08/30 0754) Resp:  [13-19] 15 (08/30 0335) BP: (139-167)/(78-91) 151/91 (08/30 0335) SpO2:  [93 %-97 %] 96 % (08/30 0335)  Intake/Output from previous day: 08/29 0701 - 08/30 0700 In: 700 [P.O.:700] Out: 1740 [Urine:1650; Chest Tube:90]  General appearance: alert, cooperative, and no distress Heart: regular rate and rhythm Lungs: clear to auscultation bilaterally Abdomen: soft, non-tender; bowel sounds normal; no masses,  no organomegaly Extremities: extremities normal, atraumatic, no cyanosis or edema Wound: clean and dry  Lab Results: No results for input(s): "WBC", "HGB", "HCT", "PLT" in the last 72 hours. BMET:  Recent Labs    11/22/21 0340  NA 137  K 4.1  CL 101  CO2 27  GLUCOSE 101*  BUN 10  CREATININE 0.95  CALCIUM 8.5*    PT/INR: No results for input(s): "LABPROT", "INR" in the last 72 hours. ABG    Component Value Date/Time   PHART 7.41 11/15/2021 1010   HCO3 26.0 11/15/2021 1010   O2SAT 98.9 11/15/2021 1010   CBG (last 3)  Recent Labs    11/19/21 1133  GLUCAP 158*    Assessment/Plan: S/P Procedure(s) (LRB): XI ROBOTIC ASSISTED THORACOSCOPY-RIGHT UPPER LOBE WEDGE RESECTION (Right) XI ROBOTIC ASSISTED THORACOSCOPY WITH RIGHT UPPER LOBECTOMY (Right) INTERCOSTAL NERVE  BLOCK (Right) LYMPH NODE DISSECTION (Right)  CV- NSR, developed rapid Atrial Fibrillation with RVR after I left the room- will give Amiodarone bolus and drip, continue Lopressor Pulm- CXR with stable appearance, large air leak persists, leave chest tube on water seal today, Mucinex, IS Renal-creatinine stable, K is normal GI- constipation resolved GU- urinary retention, leave foley in place for now Lovenox for DVT prophylaxis Dispo- patient back in Atrial Fibrillation with RVR, continue Lopressor, will attempt to remove foley this evening, continue current care   LOS: 5 days    Ellwood Handler, PA-C 11/22/2021

## 2021-11-22 NOTE — TOC Progression Note (Signed)
Transition of Care [facility]) - Progression Note    Patient Details  Name: Joseph Larson MRN: [id number] Date of Birth: 01-Mar-1956  Transition of Care [facility]) CM/SW [place], RN Phone Number:[phone]  11/22/2021, 3:21 PM  Clinical Narrative:    TOC continues to follow. No needs noted at this time.         Expected Discharge Plan and Services                                                 Social Determinants of Health (SDOH) Interventions    Readmission Risk Interventions     No data to display

## 2021-11-23 ENCOUNTER — Inpatient Hospital Stay (HOSPITAL_COMMUNITY): Payer: Medicare Other

## 2021-11-23 DIAGNOSIS — Z902 Acquired absence of lung [part of]: Secondary | ICD-10-CM

## 2021-11-23 NOTE — Progress Notes (Addendum)
[address]       [place] [address]             [phone]      6 Days Post-Op Procedure(s) (LRB): XI ROBOTIC ASSISTED THORACOSCOPY-RIGHT UPPER LOBE WEDGE RESECTION (Right) XI ROBOTIC ASSISTED THORACOSCOPY WITH RIGHT UPPER LOBECTOMY (Right) INTERCOSTAL NERVE BLOCK (Right) LYMPH NODE DISSECTION (Right)  Subjective:  Patient without new complaints.  His pain is well controlled.  He denies need to void as his foley wasn't removed until 4 am.  Per nursing this was at patient's request  Objective: Vital signs in last 24 hours: Temp:  [97.7 F (36.5 C)-98.3 F (36.8 C)] 97.7 F (36.5 C) (08/31 0421) Pulse Rate:  [56-156] 61 (08/31 0421) Cardiac Rhythm: Normal sinus rhythm (08/30 2022) Resp:  [15-18] 18 (08/30 1613) BP: (126-162)/(67-100) 145/68 (08/31 0421) SpO2:  [94 %-97 %] 97 % (08/30 1613)  Intake/Output from previous day: 08/30 0701 - 08/31 0700 In: 480 [P.O.:480] Out: 3480 [Urine:3400; Chest Tube:80]  General appearance: alert, cooperative, and no distress Heart: regular rate and rhythm Lungs: clear to auscultation bilaterally Abdomen: soft, non-tender; bowel sounds normal; no masses,  no organomegaly Extremities: extremities normal, atraumatic, no cyanosis or edema Wound: clean and dry  Lab Results: No results for input(s): "WBC", "HGB", "HCT", "PLT" in the last 72 hours. BMET:  Recent Labs    11/22/21 0340  NA 137  K 4.1  CL 101  CO2 27  GLUCOSE 101*  BUN 10  CREATININE 0.95  CALCIUM 8.5*    PT/INR: No results for input(s): "LABPROT", "INR" in the last 72 hours. ABG    Component Value Date/Time   PHART 7.41 11/15/2021 1010   HCO3 26.0 11/15/2021 1010   O2SAT 98.9 11/15/2021 1010   CBG (last 3)  Recent Labs    11/22/21 1127 11/22/21 1614 11/22/21 1631  GLUCAP 130* 96 136*    Assessment/Plan: S/P Procedure(s) (LRB): XI ROBOTIC ASSISTED THORACOSCOPY-RIGHT UPPER LOBE WEDGE RESECTION (Right) XI ROBOTIC ASSISTED  THORACOSCOPY WITH RIGHT UPPER LOBECTOMY (Right) INTERCOSTAL NERVE BLOCK (Right) LYMPH NODE DISSECTION (Right)  CV- PAF, maintaining NSR since yesterday afternoon- will stop IV amiodarone, continue oral Amiodarone, Lopressor Pulm- CT with continued air leak, CXR with moderate pneumothorax that remains unchanged on water seal..  GU- urinary retention, on Flomax, foley catheter was ordered to be removed at midnight, unfortunately this was not done until 4 am (per nursing this was at patient's request) will monitor if unable to void will need replacement of foley Continue Lovenox for DVT prophylaxis D/C Central Line Dispo- patient in NSR, continue Amiodarone, Lopressor.. CHADSvasc score is 1 will hold off on NOAC at this time, persistent large air leak on water seal, CXR with moderate pneumothorax... Urinary retention foley catheter has been removed, patient has not voided yet.. patient may need bronchial valves vs. Mini express... discussed with Dr. Kipp Brood yesterday and he recommended patient staying on water seal   LOS: 6 days    Joseph Handler, PA-C 11/23/2021 Patient examined and today's chest x-ray reviewed. Patient maintaining sinus rhythm, walking 450 feet on room air, and chest x-ray is clear with mild right lateral space.  Large airleak with strong cough, no airleak with normal respiration. We will convert to mini express tomorrow to follow patient's voiding status after catheter removed this a.m. (patient had a cystoscopy and urethral dilatation to insert the catheter).  Will need home health set up for home health RN to follow the chest tube.  patient examined and medical record reviewed,agree with above note. Dahlia Byes 11/23/2021

## 2021-11-23 NOTE — Progress Notes (Signed)
Mobility Specialist Progress Note    11/23/21 0952  Mobility  Activity Ambulated with assistance in hallway  Level of Assistance Standby assist, set-up cues, supervision of patient - no hands on  Assistive Device Front wheel walker  Distance Ambulated (ft) 450 ft  Activity Response Tolerated well  $Mobility charge 1 Mobility   During Mobility: 71 HR, 94% SpO2 Post-Mobility: 66 HR, 95% SpO2  Pt received and agreeable. C/o a little lightheadedness during. Returned to chair with call bell in reach.    Hildred Alamin Mobility Specialist

## 2021-11-23 NOTE — Discharge Summary (Incomplete)
Physician Discharge Summary  Patient ID: Joseph Larson MRN: [id number] DOB/AGE: Feb 10, 1956 66 y.o.  Admit date: 11/17/2021 Discharge date: 11/24/2021  Admission Diagnoses:  Patient Active Problem List   Diagnosis Date Noted   Lung nodule 11/17/2021   Adenocarcinoma, lung, right ([place]) 11/17/2021   Osteoarthritis of left glenohumeral joint 05/27/2017   Other specified health status 09/05/2015   Right flank pain 03/08/2015   BPH with obstruction/lower urinary tract symptoms 09/06/2014   Elevated PSA 09/06/2014   Other male erectile dysfunction 09/06/2014   Acute inflammation of the pancreas 08/18/2014   Alcohol drinker 08/18/2014   Angina pectoris ([place]) 08/18/2014   Anxiety 08/18/2014   Cervical pain 08/18/2014   Chronic rhinitis 08/18/2014   Diastasis recti 08/18/2014   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   Acid reflux 08/18/2014   Headache, migraine 08/18/2014   Abdominal hernia 08/18/2014   Hemorrhoid 08/18/2014   HLD (hyperlipidemia) 08/18/2014   BP (high blood pressure) 08/18/2014   LBP (low back pain) 08/18/2014   Nerve pain 08/18/2014   Arthritis, degenerative 08/18/2014   Compulsive tobacco user syndrome 08/18/2014   Lumbar canal stenosis 07/07/2014   Neuritis or radiculitis due to rupture of lumbar intervertebral disc 05/14/2014   DDD (degenerative disc disease), lumbar 05/03/2014   Low back strain 05/03/2014   Abnormal prostate specific antigen 01/25/2014   Pancreatitis 01/17/2012   Discharge Diagnoses:  Adenocarcinoma of the lung, right upper lobe.  Clinical stage Ia (T1, N0).  Pathologic stage Ib (T2 a, N0)  Patient Active Problem List   Diagnosis Date Noted   S/P Robotic Assisted Right Upper Lobectomy, Intercostal Nerve Block, Lymph Node Dissection 11/23/2021   Lung nodule 11/17/2021   Adenocarcinoma, lung, right ([place]) 11/17/2021   Osteoarthritis of left glenohumeral joint 05/27/2017   Other specified  health status 09/05/2015   Right flank pain 03/08/2015   BPH with obstruction/lower urinary tract symptoms 09/06/2014   Elevated PSA 09/06/2014   Other male erectile dysfunction 09/06/2014   Acute inflammation of the pancreas 08/18/2014   Alcohol drinker 08/18/2014   Angina pectoris ([place]) 08/18/2014   Anxiety 08/18/2014   Cervical pain 08/18/2014   Chronic rhinitis 08/18/2014   Diastasis recti 08/18/2014   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   Acid reflux 08/18/2014   Headache, migraine 08/18/2014   Abdominal hernia 08/18/2014   Hemorrhoid 08/18/2014   HLD (hyperlipidemia) 08/18/2014   BP (high blood pressure) 08/18/2014   LBP (low back pain) 08/18/2014   Nerve pain 08/18/2014   Arthritis, degenerative 08/18/2014   Compulsive tobacco user syndrome 08/18/2014   Lumbar canal stenosis 07/07/2014   Neuritis or radiculitis due to rupture of lumbar intervertebral disc 05/14/2014   DDD (degenerative disc disease), lumbar 05/03/2014   Low back strain 05/03/2014   Abnormal prostate specific antigen 01/25/2014   Pancreatitis 01/17/2012  Post-op atrial fibrillation  Discharged Condition: good  History of Present Illness:  Joseph Larson is a 66 y.o. man with PMH smoking, COPD, HTN, HLD, CAD, GERD, arthritis, who was found to have an 79mm RUL nodule on screening CT 07/07/21. This was minimally enlarged to 80mm on 63mo f/up CT 10/02/21 and was found to be PET-avid with SUV 5.9. He has no evidence of nodal disease or distant spread. He did undergo brain MRI w/o evidence of metastasis.   He has otherwise been feeling generally well. He has had no recent weight loss. He has no exertional dyspnea  or  chest pain. He exercises at the gym every other day, and occasionally uses his inhaler prior to exercise. He lives with his wife, who will be his primary support person post-op.   He does continue to smoke ~10 cigarettes/day. He has tried to quit  multiple times previously and is contemplating another attempt, though is not optimistic.   He follows with a cardiologist for CAD diagnosed on chest CT, and pulmonologist in [place]. In July, he had some episodes of dizziness which were worked up with a carotid ultrasound without evidence of significant stenosis. He was found to have a cerumen impaction, the treatment of which improved his symptoms.  He was evaluated by Joseph Larson who felt the nodule in the right upper lobe.  It was felt he would undergo Robotic assisted resection.  The risks and benefits of the procedure were explained to the patient and he was agreeable to proceed.    Hospital Course:  Joseph Larson presented to [facility] on 11/17/2021.  He was taken to the operating room and underwent Robotic Assisted Video Assisted Right Upper Lobe Wedge Resection, Completion Right Upper lobectomy, lymph node dissection and intercostal nerve block.  The patient tolerated the procedure, was extubated, and taken to the PACU in stable condition.  The patient underwent multiple failed attempts to place foley catheter in the operating room.  On arrival to the progressive care unit the patient was unable to void.  Urology consult was obtained who placed foley catheter.  Patient was also started on Flomax.  His chest tube had an air leak with increase in pneumothorax on the right.  His chest tube was transitioned to suction.  The patient developed Atrial Fibrillation with RVR.  He was treated with Amiodarone drip protocol.  He also required IV Lopressor bolus to help achieve adequate HR control. He was started on oral regimen of Lopressor.  His chest tube was left on suction with improvement of pneumothorax.  He was placed back to water seal on 11/20/2021.  The patient's follow up CXR showed development of small pneumothorax.  His air leak persisted.  He was hypertensive and his lopressor dose was increased.  The patient again developed Atrial  Fibrillation with RVR.  He was treated with IV Amiodarone bolus and drip therapy.  He converted back to NSR and his oral regimen of Amiodarone was continued.  His foley catheter was removed on 11/23/2021.  He was able to void without difficulty post removal.  Patient's chest tube was placed to a Mini Express on 11/24/2021.  Follow up CXR showed full expansion of the right lung.  He is maintaining NSR.  Home health service has been arranged to assist with management of chest tube and dressing changes. He is ambulating independently.  His surgical incisions are healing without evidence of infection.  He is medically stable for discharge home today.  Consults: None  Significant Diagnostic Studies: nuclear medicine:   FINDINGS: Mediastinal blood pool activity: SUV max 1.7   Liver activity: SUV max NA   NECK: No hypermetabolic thoracic adenopathy.   Incidental CT findings: none   CHEST: Hypermetabolic anterior right upper lobe pulmonary nodule measures 1 cm on image 93/2 with a max SUV 5.9.   No additional hypermetabolic pulmonary nodules or masses. No hypermetabolic thoracic adenopathy.   Incidental CT findings: Aortic atherosclerosis. Distal esophageal wall thickening does not demonstrate significant abnormal FDG avidity possibly reflecting sequela of gastroesophageal reflux.   ABDOMEN/PELVIS: No abnormal hypermetabolic activity within the liver,  pancreas, adrenal glands, or spleen. No hypermetabolic lymph nodes in the abdomen or pelvis.   Incidental CT findings: Gallbladder surgically absent. Aortic atherosclerosis. Colonic diverticulosis. Prior right inguinal hernia repair. Moderate-sized fat containing left inguinal hernia.   SKELETON: No focal hypermetabolic activity to suggest skeletal metastasis.   Incidental CT findings: Multilevel degenerative changes spine with mild multifocal degenerative joint disease.   IMPRESSION: 1. Hypermetabolic 1 cm right upper lobe pulmonary  nodule is most consistent with primary bronchogenic carcinoma. 2. No hypermetabolic thoracic adenopathy. 3. No evidence of hypermetabolic distant metastatic disease. 4. Non hypermetabolic distal esophageal wall thickening may reflect sequela of gastroesophageal reflux. 5. Moderate-sized fat containing left inguinal hernia. 6. Aortic Atherosclerosis (ICD10-I70.0) and Emphysema (ICD10-J43.9).    Electronically Signed   By: Dahlia Bailiff M.D.   On: 10/19/2021 14:56  Treatments: surgery:   Operative Report    DATE OF PROCEDURE: 11/17/2021     PREOPERATIVE DIAGNOSIS:  Right upper lobe lung nodule.   POSTOPERATIVE DIAGNOSIS:  Non-small cell carcinoma, right upper lobe, clinical stage IA.   PROCEDURE:  Xi robotic-assisted right upper lobe wedge resection, right upper lobectomy, lymph node dissection and intercostal nerve blocks levels 3 through 10.   SURGEON:  Revonda Standard. Roxan Hockey, MD   ASSISTANT:  Romie Levee, MD  PATHOLOGY:  SURGICAL PATHOLOGY  CASE: [phone]  PATIENT: Joseph Larson  Surgical Pathology Report   Clinical History: RUL nodule (cm)   FINAL MICROSCOPIC DIAGNOSIS:   A. LUNG, RIGHT UPPER LOBE, WEDGE RESECTION:  - Adenocarcinoma, 1.7 cm.  - Carcinoma involves pleural connective tissue (PL1) (pT2a).  - All surgical margins negative for carcinoma.  - See oncology table.   B. LYMPH NODE, LEVEL 8, EXCISION:  - Lymph node negative for metastatic carcinoma (0/1).   C. LYMPH NODE, LEVEL 11, EXCISION:  - Lymph node negative for metastatic carcinoma (0/1).   D. LYMPH NODE, 4R, EXCISION:  - Lymph node negative for metastatic carcinoma (0/1).   E. LYMPH NODE, 4R #2, EXCISION:  - Lymph node negative for metastatic carcinoma (0/1).   F. LYMPH NODE, LEVEL 12, EXCISION:  - Lymph node negative for metastatic carcinoma (0/1).   G. LYMPH NODE, LEVEL 10, EXCISION:  - Lymph node negative for metastatic carcinoma (0/1).   H. LYMPH NODE, LEVEL 12 #2, EXCISION:   - Lymph node negative for metastatic carcinoma (0/1).   I. LYMPH NODE, LEVEL 11, EXCISION:  - Lymph node negative for metastatic carcinoma (0/1).   J. LUNG, RIGHT UPPER LOBE, LOBECTOMY:  - Findings consistent with previous wedge excision.  - No residual carcinoma identified.  - All surgical margins negative for carcinoma.  - Three lymph nodes negative for metastatic carcinoma (0/3).   K. LYMPH NODE, LEVEL 11 #2, EXCISION:  - Lymph node negative for metastatic carcinoma (0/1).  TNM Code: pT2a, pN0   Discharge Exam: Blood pressure (!) 153/81, pulse 71, temperature 98 F (36.7 C), temperature source Oral, resp. rate 15, height 6' (1.829 m), weight 88.5 kg, SpO2 96 %.  Cardiovascular: Stable SR. Pulmonary: breath sounds are clear. Moderate air leak with cough only. Mini Express pleural drainage system is functioning appropriately. CXR showing right lung well expanded.  Abdomen: Soft, non tender, bowel sounds present. Extremities: No lower extremity edema. Wounds: Clean and dry.  No erythema or signs of infection.  Disposition: Discharged to home in stable condition   Allergies as of 11/24/2021   No Known Allergies      Medication List     STOP taking  these medications    bisoprolol-hydrochlorothiazide 5-6.25 MG tablet Commonly known as: ZIAC   HYDROcodone-acetaminophen 5-325 MG tablet Commonly known as: NORCO/VICODIN       TAKE these medications    acetaminophen 500 MG tablet Commonly known as: TYLENOL Take 1,000 mg by mouth every 6 (six) hours as needed for moderate pain or headache.   albuterol 108 (90 Base) MCG/ACT inhaler Commonly known as: VENTOLIN HFA Inhale 2 puffs into the lungs every 6 (six) hours as needed for wheezing or shortness of breath.   amiodarone 200 MG tablet Commonly known as: PACERONE Take 1 tablet (200 mg total) by mouth 2 (two) times daily for 7 days, then decrease to 200 mg daily   cyclobenzaprine 10 MG tablet Commonly known as:  FLEXERIL Take 10 mg by mouth 3 (three) times daily as needed for muscle spasms.   fexofenadine 180 MG tablet Commonly known as: ALLEGRA Take 180 mg by mouth daily as needed for allergies or rhinitis.   ibuprofen 200 MG tablet Commonly known as: ADVIL Take 400 mg by mouth every 6 (six) hours as needed for headache or moderate pain.   meloxicam 15 MG tablet Commonly known as: MOBIC Take 15 mg by mouth every evening.   metoprolol tartrate 25 MG tablet Commonly known as: LOPRESSOR Take 1 tablet (25 mg total) by mouth 2 (two) times daily.   Oxycodone HCl 10 MG Tabs Take 1 tablet (10 mg total) by mouth every 6 (six) hours as needed for severe pain.   pantoprazole 40 MG tablet Commonly known as: PROTONIX Take 40 mg by mouth every evening.   sildenafil 100 MG tablet Commonly known as: VIAGRA Take 1 tablet (100 mg total) by mouth daily as needed for erectile dysfunction.   tamsulosin 0.4 MG Caps capsule Commonly known as: FLOMAX Take 1 capsule (0.4 mg total) by mouth daily.        Follow-up Information     [name], MD Follow up on 11/29/2021.   Specialty: Cardiothoracic Surgery Why: Appointment is at 3:30, please get CXR at 3:00 at [facility] located on first floor of our office building Contact information: [place] [address] [phone]         Corey Skains, MD Follow up on 12/12/2021.   Specialty: Cardiology Why: Appointment is at 3:45 Contact information: [address] [facility] [place] [address] [phone]                 Signed: Arnoldo Lenis 11/24/2021, 10:22 AM

## 2021-11-24 ENCOUNTER — Other Ambulatory Visit (HOSPITAL_COMMUNITY): Payer: Self-pay

## 2021-11-24 ENCOUNTER — Inpatient Hospital Stay (HOSPITAL_COMMUNITY): Payer: Medicare Other

## 2021-11-24 MED ORDER — AMIODARONE HCL 200 MG PO TABS
200.0000 mg | ORAL_TABLET | Freq: Two times a day (BID) | ORAL | 1 refills | Status: DC
  Filled 2021-11-24: qty 37, 30d supply, fill #0

## 2021-11-24 MED ORDER — TAMSULOSIN HCL 0.4 MG PO CAPS
0.4000 mg | ORAL_CAPSULE | Freq: Every day | ORAL | 3 refills | Status: DC
  Filled 2021-11-24: qty 30, 30d supply, fill #0

## 2021-11-24 MED ORDER — OXYCODONE HCL 10 MG PO TABS
10.0000 mg | ORAL_TABLET | Freq: Four times a day (QID) | ORAL | 0 refills | Status: AC | PRN
  Filled 2021-11-24: qty 30, 7d supply, fill #0

## 2021-11-24 MED ORDER — METOPROLOL TARTRATE 25 MG PO TABS
25.0000 mg | ORAL_TABLET | Freq: Two times a day (BID) | ORAL | 3 refills | Status: DC
  Filled 2021-11-24: qty 60, 30d supply, fill #0

## 2021-11-24 NOTE — Progress Notes (Addendum)
      [address]       [place] [address]             [phone]       7 Days Post-Op Procedure(s) (LRB): XI ROBOTIC ASSISTED THORACOSCOPY-RIGHT UPPER LOBE WEDGE RESECTION (Right) XI ROBOTIC ASSISTED THORACOSCOPY WITH RIGHT UPPER LOBECTOMY (Right) INTERCOSTAL NERVE BLOCK (Right) LYMPH NODE DISSECTION (Right)  Subjective: Patient without specific complaint this am. He had several questions and all were answered.  Objective: Vital signs in last 24 hours: Temp:  [97.7 F (36.5 C)-98.5 F (36.9 C)] 98 F (36.7 C) (09/01 0337) Pulse Rate:  [57-67] 57 (09/01 0337) Cardiac Rhythm: Sinus bradycardia (08/31 2045) Resp:  [16-17] 16 (09/01 0337) BP: (137-163)/(68-90) 149/83 (09/01 0337) SpO2:  [94 %-98 %] 96 % (09/01 0337)     Intake/Output from previous day: 08/31 0701 - 09/01 0700 In: 480 [P.O.:480] Out: 1290 [Urine:1150; Chest Tube:140]   Physical Exam:  Cardiovascular: Slightly bradycardic this am Pulmonary: Clear to auscultation bilaterally Abdomen: Soft, non tender, bowel sounds present. Extremities: No lower extremity edema. Wounds: Clean and dry.  No erythema or signs of infection. Chest Tube: to water seal, +++ air leak with cough.   Lab Results: CBC:No results for input(s): "WBC", "HGB", "HCT", "PLT" in the last 72 hours. BMET:  Recent Labs    11/22/21 0340  NA 137  K 4.1  CL 101  CO2 27  GLUCOSE 101*  BUN 10  CREATININE 0.95  CALCIUM 8.5*    PT/INR: No results for input(s): "LABPROT", "INR" in the last 72 hours. ABG:  INR: Will add last result for INR, ABG once components are confirmed Will add last 4 CBG results once components are confirmed  Assessment/Plan:  1. CV - PAF post op. Maintaining SR; SB this am. On Amiodarone 200 mg bid and Lopressor 25 mg bid 2.  Pulmonary - On room air. Chest tube with 140 cc last 24 hours. . Chest tube is to water seal, +++ air leak with cough. CXR this am appears stable (moderate right  pneumothorax, right lateral chest wall subcutaneous emphysema,bibasilar atelectasis). Transition to mini express. Encourage incentive spirometer 3. Lovenox for DVT prophylaxis 4. Urinary retention (had  cystoscopy and urethral dilatation to insert the catheter for surgery) resolved-voided on own yesterday. Continue Flomax 5. HH to be arranged to assist with mini express and dressing changes;as discussed with Dr. Prescott Gum, home in am  [facility] 11/24/2021,6:59 AM [phone]    Chart reviewed, patient examined, agree with above. Chest tube changed to Mini-Express. Air leak present with cough.  CXR looked ok this am. There is tiny apical ptx on right if any.  Plan CXR in am and home with Mini-Express. He and wife are ok with that.

## 2021-11-24 NOTE — Plan of Care (Signed)
  Problem: Clinical Measurements: Goal: Respiratory complications will improve Outcome: Progressing Goal: Cardiovascular complication will be avoided Outcome: Progressing   Problem: Activity: Goal: Risk for activity intolerance will decrease Outcome: Progressing   Problem: Coping: Goal: Level of anxiety will decrease Outcome: Progressing   Problem: Elimination: Goal: Will not experience complications related to bowel motility Outcome: Progressing Goal: Will not experience complications related to urinary retention Outcome: Progressing   Problem: Pain Managment: Goal: General experience of comfort will improve Outcome: Progressing

## 2021-11-24 NOTE — TOC Initial Note (Addendum)
Transition of Care [facility]) - Initial/Assessment Note    Patient Details  Name: Joseph Larson MRN: [id number] Date of Birth: 02/02/1956  Transition [facility]) CM/SW Contact:    Angelita Ingles, RN Phone Number:[phone]  11/24/2021, 1:34 PM  Clinical Narrative:                 [facility] consulted for home health needs. Patient discharging with chest tube mini express and will need RN to follow up to assist with care and dressing changes. CM at bedside to offer choice  to patient and wife. Patient state that he has no preference for home health agency. Home health referral has been called to Freddie Breech with [facility]. Acceptance pending.   [facility] is unable to accept home health referral. New referral sent to [facility] with [facility]. [facility] is able to accept referral. AVS has been updated. No other needs noted at this time.          Patient Goals and CMS Choice        Expected Discharge Plan and Services                                                Prior Living Arrangements/Services                       Activities of Daily Living Home Assistive Devices/Equipment: Eyeglasses, Blood pressure cuff, Hand-held shower hose, Scales ADL Screening (condition at time of admission) Patient's cognitive ability adequate to safely complete daily activities?: Yes Is the patient deaf or have difficulty hearing?: No Does the patient have difficulty seeing, even when wearing glasses/contacts?: No Does the patient have difficulty concentrating, remembering, or making decisions?: No Patient able to express need for assistance with ADLs?: Yes Does the patient have difficulty dressing or bathing?: No Independently performs ADLs?: Yes (appropriate for developmental age) Does the patient have difficulty walking or climbing stairs?: No Weakness of Legs: None Weakness of Arms/Hands: None  Permission Sought/Granted                  Emotional Assessment               Admission diagnosis:  Lung nodule [R91.1] Adenocarcinoma, lung, right ([place]) [C34.91] Patient Active Problem List   Diagnosis Date Noted   S/P Robotic Assisted Right Upper Lobectomy, Intercostal Nerve Block, Lymph Node Dissection 11/23/2021   Lung nodule 11/17/2021   Adenocarcinoma, lung, right ([place]) 11/17/2021   Osteoarthritis of left glenohumeral joint 05/27/2017   Other specified health status 09/05/2015   Right flank pain 03/08/2015   BPH with obstruction/lower urinary tract symptoms 09/06/2014   Elevated PSA 09/06/2014   Other male erectile dysfunction 09/06/2014   Acute inflammation of the pancreas 08/18/2014   Alcohol drinker 08/18/2014   Angina pectoris ([place]) 08/18/2014   Anxiety 08/18/2014   Cervical pain 08/18/2014   Chronic rhinitis 08/18/2014   Diastasis recti 08/18/2014   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   Acid reflux 08/18/2014   Headache, migraine 08/18/2014   Abdominal hernia 08/18/2014   Hemorrhoid 08/18/2014   HLD (hyperlipidemia) 08/18/2014   BP (high blood pressure) 08/18/2014   LBP (low back pain) 08/18/2014   Nerve pain 08/18/2014   Arthritis, degenerative 08/18/2014   Compulsive tobacco user syndrome 08/18/2014   Lumbar  canal stenosis 07/07/2014   Neuritis or radiculitis due to rupture of lumbar intervertebral disc 05/14/2014   DDD (degenerative disc disease), lumbar 05/03/2014   Low back strain 05/03/2014   Abnormal prostate specific antigen 01/25/2014   Pancreatitis 01/17/2012   PCP:  Idelle Crouch, MD Pharmacy:   [id number] [address] [place] [address] Phone: [phone] Fax: [phone]  [facility] - [place], [address] [facility] [address] [place] [address] Phone: [phone] Fax: [phone]  [facility] [address] [place] [address] Phone: [phone] Fax: [phone]     Social Determinants of Health (SDOH) Interventions    Readmission Risk Interventions     No data to display

## 2021-11-24 NOTE — Discharge Instructions (Signed)
   General Patient Instructions:  If the tube becomes disconnected, reconnect it immediately and tape it securely. To reconnect the tube: Pinch the chest tube with your fingers to close the chest tube until you can re attach it. If you are able, clean the ends of the chest tube and drain with an alcohol swab before reattaching.   If the tube becomes disconnected AND you cannot put it back together, go to closest Emergency Department  Please do NOT allow the mini express chamber to become full. To empty the fluid from the chamber: First, wash your hands with soap and water. Use a Luer lock syringe (provided by hospital or home health, if arranged) to attach to the front port (twist Luer lock syringe clock wise to to tighten) at the bottom of the mini express. Pull the syringe plunger back, unscrew the syringe and put fluid into the toilet. You may repeat as necessary. If it becomes difficult to empty the fluid in the mini express, squirt water through the port (where syringe attaches to) to flush out the blockage. If this does not work, call the office .  Try to keep mini express upright and below the level of your heart. Also, check periodically that there are no kinks in the tubing.   Changing the chest tube dressing: Please change the dressing at least every other day or if it becomes wet. You will be taught how to change the dressing before you are discharged from the hospital or home health will be arranged to do it for you.   If the chest tube falls out or gets pulled out: Place a piece of gauze over the site and cover the gauze with tape. Contact our office ASAP [phone]) If you have chest pain or sudden onset of shortness of breath, go to the nearest emergency room

## 2021-11-24 NOTE — Progress Notes (Signed)
The right pleural tube was placed to an At[facility] Express Mini 500 dry seal chest drain using sterile technique.  The function and care of the device was discussed with Joseph Larson and his wife at the bedside.  Home health nursing service has also been requested to assist with  care and monitoring of the chest drain.   Follow up CXR in AM.   Macarthur Critchley, PA-C [phone]

## 2021-11-25 ENCOUNTER — Inpatient Hospital Stay (HOSPITAL_COMMUNITY): Payer: Medicare Other

## 2021-11-25 NOTE — Progress Notes (Signed)
Pt got discharged to home, discharge instructions provided and patient showed understanding to it, IV taken out,Telemonitor DC,pt left unit in wheelchair with all of the belongings accompanied with a family member (wife)  Veniamin Kincaid,RN 

## 2021-11-25 NOTE — Progress Notes (Signed)
      [address]       [place] [address]             [phone]       8 Days Post-Op Procedure(s) (LRB): XI ROBOTIC ASSISTED THORACOSCOPY-RIGHT UPPER LOBE WEDGE RESECTION (Right) XI ROBOTIC ASSISTED THORACOSCOPY WITH RIGHT UPPER LOBECTOMY (Right) INTERCOSTAL NERVE BLOCK (Right) LYMPH NODE DISSECTION (Right)  Subjective: Had a good day yesterday, no new concerns.  Voiding without difficulty, BM yesterday.  Independent with mobility.  Objective: Vital signs in last 24 hours: Temp:  [97.8 F (36.6 C)-98.3 F (36.8 C)] 97.9 F (36.6 C) (09/02 0728) Pulse Rate:  [55-71] 58 (09/02 0728) Cardiac Rhythm: Sinus bradycardia (09/02 0505) Resp:  [15-16] 15 (09/02 0728) BP: (137-146)/(70-87) 146/75 (09/02 0728) SpO2:  [94 %-98 %] 94 % (09/02 0728)     Intake/Output from previous day: 09/01 0701 - 09/02 0700 In: 720 [P.O.:720] Out: 1225 [Urine:1125; Chest Tube:100]   Physical Exam:  Cardiovascular: Stable SR. Pulmonary: breath sounds are clear. Moderate air leak with cough only. Mini Express pleural drainage system is functioning appropriately. CXR showing right lung well expanded.  Abdomen: Soft, non tender, bowel sounds present. Extremities: No lower extremity edema. Wounds: Clean and dry.  No erythema or signs of infection.  Assessment/Plan:  1. CV - PAF post op. Maintaining SR on Amiodarone 200 mg bid and Lopressor 25 mg bid 2.  Pulmonary - On room air without dyspnea. Chest tube with 100 ml last 24 hours.  Moderate air leak with cough.  3. Urinary retention (had  cystoscopy and urethral dilatation to insert the catheter for surgery) resolved-voiding without difficulty. Continue Flomax at discharge 4. Home health services arranged to assist with Mini Express and dressing changes  Disposition: Discharge today. Instructions given and follow up with CXR arranged.   Antony Odea, PA-C 11/25/2021,7:59 AM [phone]

## 2021-11-25 NOTE — Plan of Care (Signed)

## 2021-11-25 NOTE — TOC Transition Note (Addendum)
Transition of Care [facility]) - CM/SW Discharge Note   Patient Details  Name: Joseph Larson MRN: [id number] Date of Birth: 10/22/1955  Transition of Care [facility]) CM/SW Contact:  Konrad Penta, RN Phone Number: [phone] 11/25/2021, 8:31 AM   Clinical Narrative:   [name] slated for dc home today. Confirmed with Tommi Rumps with [facility] [name] is to transition home today. Spoke with spouse. No further identifiable TOC needs.     Final next level of care: Home/Self Care Barriers to Discharge: No Barriers Identified   Patient Goals and CMS Choice        Discharge Placement                       Discharge Plan and Services                          HH Arranged: RN [facility] Agency: [place] Date [facility] Agency Contacted: 11/24/21 Time [place]: [place] Representative spoke with at [place]: Tommi Rumps  Social Determinants of Health ([place]) Interventions     Readmission Risk Interventions     No data to display

## 2021-11-28 ENCOUNTER — Other Ambulatory Visit (HOSPITAL_COMMUNITY): Payer: Self-pay

## 2021-11-29 ENCOUNTER — Ambulatory Visit: Payer: Medicare Other | Admitting: Thoracic Surgery (Cardiothoracic Vascular Surgery)

## 2021-11-30 ENCOUNTER — Other Ambulatory Visit: Payer: Self-pay | Admitting: *Deleted

## 2021-11-30 ENCOUNTER — Other Ambulatory Visit: Payer: Self-pay | Admitting: Thoracic Surgery (Cardiothoracic Vascular Surgery)

## 2021-11-30 ENCOUNTER — Ambulatory Visit
Admission: RE | Admit: 2021-11-30 | Discharge: 2021-11-30 | Disposition: A | Discharge status: Home / Self Care | Payer: Medicare Other | Source: Ambulatory Visit | Attending: Thoracic Surgery (Cardiothoracic Vascular Surgery) | Admitting: Thoracic Surgery (Cardiothoracic Vascular Surgery)

## 2021-11-30 ENCOUNTER — Ambulatory Visit (INDEPENDENT_AMBULATORY_CARE_PROVIDER_SITE_OTHER): Payer: Self-pay | Admitting: Thoracic Surgery (Cardiothoracic Vascular Surgery)

## 2021-11-30 VITALS — BP 148/77 | HR 56 | Resp 20 | Ht 72.0 in | Wt 193.0 lb

## 2021-11-30 DIAGNOSIS — Z902 Acquired absence of lung [part of]: Secondary | ICD-10-CM

## 2021-11-30 DIAGNOSIS — Z09 Encounter for follow-up examination after completed treatment for conditions other than malignant neoplasm: Secondary | ICD-10-CM

## 2021-11-30 NOTE — Progress Notes (Signed)
The proposed treatment discussed in conference is for discussion purpose only and is not a binding recommendation.  The patients have not been physically examined, or presented with their treatment options.  Therefore, final treatment plans cannot be decided.  

## 2021-11-30 NOTE — Progress Notes (Signed)
[address]       [place] [address]             [phone]      HPI: [name] returns for scheduled follow-up after recent right upper lobectomy.  Joseph Larson is a 66 year old man with a past history of tobacco abuse, COPD, hypertension, hyperlipidemia, coronary disease, reflux, and arthritis.  He was found to have a lung nodule on a low-dose CT for lung cancer screening in April 2023.  In July the nodule increased in 9 mm in size.  A PET/CT showed it was avid with an SUV of 5.9.  There is no evidence of regional or distant metastatic disease.  I did a robotic assisted right upper lobectomy.  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  Postoperatively he had an air leak and went home with a chest tube.  He also had some atrial fibrillation but did convert to sinus rhythm prior to discharge.  He returns today for follow-up.  He still hears a lot of motion fluid in and out of the tube.  He is having some incisional pain.  No shortness of breath.  Past Medical History:  Diagnosis Date   Anxiety    Arthritis    osteoarthritis   BPH (benign prostatic hyperplasia)    COPD (chronic obstructive pulmonary disease) (HCC)    Coronary atherosclerosis    ED (erectile dysfunction)    Elevated PSA    Erectile dysfunction    Esophageal spasm    GERD (gastroesophageal reflux disease)    History of kidney stones    passed stone   Hyperlipidemia    Hypertension    Neuralgia    Pancreatitis    Pneumonia    Rectus diastasis    Rhinitis    chronic    Current Outpatient Medications  Medication Sig Dispense Refill   acetaminophen (TYLENOL) 500 MG tablet Take 1,000 mg by mouth every 6 (six) hours as needed for moderate pain or headache.     albuterol (VENTOLIN HFA) 108 (90 Base) MCG/ACT inhaler Inhale 2 puffs into the lungs every 6 (six) hours as needed for wheezing or shortness of breath.     amiodarone (PACERONE) 200 MG tablet Take 1 tablet (200  mg total) by mouth 2 (two) times daily for 7 days, then decrease to 200 mg daily 60 tablet 1   cyclobenzaprine (FLEXERIL) 10 MG tablet Take 10 mg by mouth 3 (three) times daily as needed for muscle spasms.     fexofenadine (ALLEGRA) 180 MG tablet Take 180 mg by mouth daily as needed for allergies or rhinitis.     ibuprofen (ADVIL) 200 MG tablet Take 400 mg by mouth every 6 (six) hours as needed for headache or moderate pain.     meloxicam (MOBIC) 15 MG tablet Take 15 mg by mouth every evening.      metoprolol tartrate (LOPRESSOR) 25 MG tablet Take 1 tablet (25 mg total) by mouth 2 (two) times daily. 60 tablet 3   Oxycodone HCl 10 MG TABS Take 1 tablet (10 mg total) by mouth every 6 (six) hours as needed for severe pain. 30 tablet 0   pantoprazole (PROTONIX) 40 MG tablet Take 40 mg by mouth every evening.     sildenafil (VIAGRA) 100 MG tablet Take 1 tablet (100 mg total) by mouth daily as needed for erectile dysfunction. 15 tablet 11   tamsulosin (FLOMAX) 0.4 MG CAPS capsule  Take 1 capsule (0.4 mg total) by mouth daily. 30 capsule 3   No current facility-administered medications for this visit.    Physical Exam BP (!) 148/77   Pulse (!) 56   Resp 20   Ht 6' (1.829 m)   Wt 193 lb (87.5 kg)   SpO2 97% Comment: RA  BMI 26.65 kg/m  66 year old man in no acute distress Alert and oriented x3 with no focal deficits Lungs clear with equal breath sounds bilaterally Cardiac regular rate and rhythm normal S1 and S2 No peripheral edema Incisions clean dry and intact with some erythema around the chest tube site No air leak with repeated coughing  Diagnostic Tests: CHEST - 2 VIEW   COMPARISON:  Radiograph 11/25/2021   FINDINGS: Unchanged cardiomediastinal silhouette. Postsurgical changes of right upper lobectomy. Stable right apical chest tube. There is no visible pneumothorax. No pleural effusion. No airspace disease. Mild degenerative changes of the thoracic spine. Subcutaneous  emphysema along the right lower chest wall.   IMPRESSION: Postsurgical changes of right upper lobectomy. Stable right apical chest tube without evidence of pneumothorax.     Electronically Signed   By: Joseph Larson M.D.   On: 11/30/2021 16:39 CHEST - 2 VIEW SAME DAY   COMPARISON:  Same day radiograph   FINDINGS: There is a small right apical pneumothorax after chest tube removal. Unchanged cardiomediastinal silhouette. Postsurgical changes of right right-sided lobectomy. No mediastinal shift. No airspace disease. No pleural effusion. Bones are unchanged. Subcutaneous emphysema along the right lower chest wall.   IMPRESSION: Small right apical pneumothorax after chest tube removal.   These results will be called to the ordering clinician or representative by the Radiologist Assistant, and communication documented in the PACS or [employer].     Electronically Signed   By: Joseph Larson M.D.   On: 11/30/2021 16:37   I personally reviewed the chest x-ray images.  The lung was completely expanded on the first image on the second images there is a questionable apical pneumo.  Small but also was in the area where the chest tube was and could just be compression on the lung from where the tube had been in place over a prolonged period of time.  Also possible small amount of air and drained with tube removal.  Impression: Joseph Larson is a 66 year old gentleman with a history of tobacco abuse who was found to have a lung nodule on a screening scan back in April.  It increased in size slightly over 3 months and a PET showed it was hypermetabolic.  He had a robotic right upper lobectomy on 11/17/2021.  Overall he did well but did have some atrial fibrillation and also an air leak and went home with a chest tube.  Postoperative air leak-I had him cough multiple times and saw no air leakage in the mini express.  The chest tube was removed intact and he tolerated that well.  On the  follow-up from there is a question of a small pneumo versus just indentation in the lung from the tube.  Either way he is clinically stable and I think he is safe to go home.  He knows to call immediately if he has any chest pain, shortness of breath, or develops any subcutaneous emphysema.  Postoperative atrial fibrillation-converted to sinus rhythm prior to discharge.  Rhythm is regular on exam today.  He is on amiodarone 200 mg twice daily.  On Saturday that will drop down to 200 mg daily. We will  plan to keep him on that for about 3 months postop.  Plan: Return in 1 week with PA lateral chest x-ray Call if chest pain, shortness of breath, or subcutaneous emphysema We will arrange follow-up with oncology at [place] at his next visit  [name], MD [facility] [phone]

## 2021-12-06 ENCOUNTER — Other Ambulatory Visit: Payer: Self-pay | Admitting: Thoracic Surgery (Cardiothoracic Vascular Surgery)

## 2021-12-06 DIAGNOSIS — R911 Solitary pulmonary nodule: Secondary | ICD-10-CM

## 2021-12-07 ENCOUNTER — Ambulatory Visit (INDEPENDENT_AMBULATORY_CARE_PROVIDER_SITE_OTHER): Payer: Self-pay | Admitting: Thoracic Surgery (Cardiothoracic Vascular Surgery)

## 2021-12-07 ENCOUNTER — Ambulatory Visit
Admission: RE | Admit: 2021-12-07 | Discharge: 2021-12-07 | Disposition: A | Discharge status: Home / Self Care | Payer: Medicare Other | Source: Ambulatory Visit | Attending: Thoracic Surgery (Cardiothoracic Vascular Surgery) | Admitting: Thoracic Surgery (Cardiothoracic Vascular Surgery)

## 2021-12-07 VITALS — BP 159/78 | HR 55 | Resp 20 | Ht 72.0 in | Wt 197.0 lb

## 2021-12-07 DIAGNOSIS — Z09 Encounter for follow-up examination after completed treatment for conditions other than malignant neoplasm: Secondary | ICD-10-CM

## 2021-12-07 DIAGNOSIS — R911 Solitary pulmonary nodule: Secondary | ICD-10-CM

## 2021-12-07 DIAGNOSIS — J95811 Postprocedural pneumothorax: Secondary | ICD-10-CM

## 2021-12-07 NOTE — Progress Notes (Signed)
[address]       [place] [address]             [phone]     HPI: Joseph Larson returns for a scheduled follow-up visit after recent right upper lobectomy.  Joseph Larson is a 66 year old man with a history of tobacco abuse, COPD, hypertension, hyperlipidemia, CAD, reflux, and arthritis.  He was found to have a lung nodule on a low-dose screening scan in April 2023.  On follow-up the nodule increased in size.  On PET/CT it was hypermetabolic with an SUV of 5.9.  He underwent a robotic assisted right upper lobectomy on 11/17/2021.  Pathology showed a T2, N0, stage Ib adenocarcinoma.  He had an air leak postoperatively and went home with a chest tube.  We were able to pull the tube out in the office last week.  He feels better since the chest tube was removed.  He is only taken 1 oxycodone in the past 5 days.  That was after he did a "5K" on his stationary bike.  He is anxious to increase his activities.  He does complain of feeling like he still needs to go the bathroom even after he goes.  He wants to know if it could be due to Flomax.  Past Medical History:  Diagnosis Date   Anxiety    Arthritis    osteoarthritis   BPH (benign prostatic hyperplasia)    COPD (chronic obstructive pulmonary disease) (HCC)    Coronary atherosclerosis    ED (erectile dysfunction)    Elevated PSA    Erectile dysfunction    Esophageal spasm    GERD (gastroesophageal reflux disease)    History of kidney stones    passed stone   Hyperlipidemia    Hypertension    Neuralgia    Pancreatitis    Pneumonia    Rectus diastasis    Rhinitis    chronic    Current Outpatient Medications  Medication Sig Dispense Refill   acetaminophen (TYLENOL) 500 MG tablet Take 1,000 mg by mouth every 6 (six) hours as needed for moderate pain or headache.     albuterol (VENTOLIN HFA) 108 (90 Base) MCG/ACT inhaler Inhale 2 puffs into the lungs every 6 (six) hours as needed for wheezing or shortness of  breath.     amiodarone (PACERONE) 200 MG tablet Take 1 tablet (200 mg total) by mouth 2 (two) times daily for 7 days, then decrease to 200 mg daily 60 tablet 1   cyclobenzaprine (FLEXERIL) 10 MG tablet Take 10 mg by mouth 3 (three) times daily as needed for muscle spasms.     fexofenadine (ALLEGRA) 180 MG tablet Take 180 mg by mouth daily as needed for allergies or rhinitis.     ibuprofen (ADVIL) 200 MG tablet Take 400 mg by mouth every 6 (six) hours as needed for headache or moderate pain.     meloxicam (MOBIC) 15 MG tablet Take 15 mg by mouth every evening.      metoprolol tartrate (LOPRESSOR) 25 MG tablet Take 1 tablet (25 mg total) by mouth 2 (two) times daily. 60 tablet 3   Oxycodone HCl 10 MG TABS Take 1 tablet (10 mg total) by mouth every 6 (six) hours as needed for severe pain. 30 tablet 0   pantoprazole (PROTONIX) 40 MG tablet Take 40 mg by mouth every evening.     sildenafil (VIAGRA) 100 MG tablet Take 1 tablet (100 mg total) by mouth daily  as needed for erectile dysfunction. 15 tablet 11   tamsulosin (FLOMAX) 0.4 MG CAPS capsule Take 1 capsule (0.4 mg total) by mouth daily. 30 capsule 3   No current facility-administered medications for this visit.    Physical Exam BP (!) 159/78   Pulse (!) 55   Resp 20   Ht 6' (1.829 m)   Wt 197 lb (89.4 kg)   SpO2 97% Comment: RA  BMI 26.42 kg/m  66 year old man in no acute distress Alert and oriented x3 with no focal deficits Lungs slightly diminished at right base but otherwise clear Incisions well-healed Cardiac regular rate and rhythm Bulge in right upper quadrant  Diagnostic Tests: CHEST - 2 VIEW   COMPARISON:  November 30, 2021   FINDINGS: Cardiomediastinal silhouette is normal. Mediastinal contours appear intact.   There is no evidence of focal airspace consolidation, pleural effusion or pneumothorax.   Osseous structures are without acute abnormality. Soft tissues are grossly normal.   IMPRESSION: No active  cardiopulmonary disease.     Electronically Signed   By: Fidela Salisbury M.D.   On: 12/07/2021 15:14 I personally reviewed the chest x-ray images.  No pneumothorax.  Postoperative changes present.  Impression: Joseph Larson is a 66 year old man with a history of tobacco abuse, COPD, hypertension, hyperlipidemia, CAD, reflux, and arthritis.   Underwent robotic assisted right upper lobectomy on 11/17/2021.  Had a prolonged air leak and went home with a 2.  Get the tube out last week.  He has not had any issues since that was removed and his x-ray shows no pneumothorax.  Postoperative atrial fibrillation-no palpitations or irregular heart rhythms that he is aware of since discharge.  He is on amiodarone 200 mg daily.  Plan to keep him on that for about 6 more weeks.  He is seeing his cardiologist Dr. Nehemiah Massed next week.  If you would like to take over management of that issue I am fine with that.  Tobacco abuse-quit smoking prior to surgery and has not smoked since.  Congratulated him for that and emphasized the importance of continued cessation.  Stage Ib (T2 a, N0) adenocarcinoma.  Should not need any adjuvant therapy.  Will refer to oncology at [facility].  He may begin driving.  Appropriate precautions were discussed.  He can begin to increase his activities gradually.  Plan: Return in 6 weeks with PA lateral chest x-ray  [name], MD [facility] [phone]

## 2021-12-12 ENCOUNTER — Inpatient Hospital Stay: Payer: Medicare Other

## 2021-12-12 ENCOUNTER — Encounter: Payer: Self-pay | Admitting: Internal Medicine

## 2021-12-12 ENCOUNTER — Inpatient Hospital Stay: Payer: Medicare Other | Attending: Internal Medicine | Admitting: Internal Medicine

## 2021-12-12 ENCOUNTER — Ambulatory Visit: Payer: Medicare Other | Admitting: Thoracic Surgery (Cardiothoracic Vascular Surgery)

## 2021-12-12 VITALS — BP 183/92 | HR 56 | Temp 97.5°F | Resp 18 | Wt 201.4 lb

## 2021-12-12 DIAGNOSIS — Z902 Acquired absence of lung [part of]: Secondary | ICD-10-CM

## 2021-12-12 DIAGNOSIS — F17210 Nicotine dependence, cigarettes, uncomplicated: Secondary | ICD-10-CM

## 2021-12-12 DIAGNOSIS — C3491 Malignant neoplasm of unspecified part of right bronchus or lung: Secondary | ICD-10-CM

## 2021-12-12 DIAGNOSIS — C3411 Malignant neoplasm of upper lobe, right bronchus or lung: Secondary | ICD-10-CM

## 2021-12-12 NOTE — Progress Notes (Signed)
[facility] CONSULT NOTE  Patient Care Team: Idelle Crouch, MD as PCP - General (Internal Medicine)  REFERRING PROVIDER: Dr. Roxan Hockey  REASON FOR REFFERAL: Stage 1B RUL adenocarcinoma   CANCER STAGING   Cancer Staging  Adenocarcinoma, lung, right [facility]) Staging form: Lung, AJCC 8th Edition - Clinical stage from 11/17/2021: Stage IB (cT2a, cN0, cM0) - Signed by Jane Canary, MD on 12/12/2021 Stage prefix: Initial diagnosis  ASSESSMENT & PLAN:  Joseph Larson 66 y.o. 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  # RUL lung adenocarcinoma, Stage Ib (pT2aN0) -Detected on low-dose CT lung screening being followed by Dr. Glenford Bayley of pulmonary.  - s/p Xi robotic-assisted right upper lobe wedge resection, Right upper lobectomy, Lymph node dissection and Intercostal nerve blocks levels 3 through 10 by Dr. Roxan Hockey on 11/17/2021  -Path report showed 1.7 cm adenocarcinoma, acinar type, involves visceral pleura (pT2a), margins negative, LVI negative, 0/12 lymph nodes involved with cancer.  Full report below.  I discussed with the patient and his wife in detail about the diagnosis of stage Ib lung adenocarcinoma, prognosis and management. The role for adjuvant chemo in Stage 1B is controversial. In the LACE meta-analysis , among patients with stage IB disease, there was only a nonsignificant trend toward improved OS (HR of death 0.93, 95% CI 0.78-1.10). NCCN guidelines was also reviewed which suggests consider either observation or adjuvant chemotherapy an appropriate option for patients with resected stage IB NSCLC, depending on risk factors for recurrence.  Per guidelines, involvement of visceral pleura is a high risk feature but this factor independently is not an indication for  adjuvant therapy. There are no other high risk features displayed on pathology.  After a long discussion, the plan is not to proceed with adjuvant chemotherapy based on risk versus benefit.  I will continue to follow him for surveillance as per NCCN guidelines.  CT chest with contrast in 6 months. Patient quit smoking at time of diagnosis and looking for support group.  Referral to smoking cessation program made.   RTC in 6 months for MD visit, labs and discuss CT scan   Orders Placed This Encounter  Procedures   CT CHEST W CONTRAST    Standing Status:   Future    Standing Expiration Date:   12/14/2022    Scheduling Instructions:     Schedule in 6 months    Order Specific Question:   If indicated for the ordered procedure, I authorize the administration of contrast media per Radiology protocol    Answer:   Yes    Order Specific Question:   Preferred imaging location?    Answer:   [facility]    Order Specific Question:   Radiology Contrast Protocol - do NOT remove file path    Answer:   [email or web address]   CBC with Differential    Standing Status:   Future    Standing Expiration Date:   12/13/2022   Comprehensive metabolic panel    Standing Status:   Future    Standing Expiration Date:   12/13/2022   Ambulatory referral to Smoking Cessation Program    Referral Priority:   Routine    Referral Type:   Consultation    Referral Reason:   Specialty Services Required    Number of Visits Requested:   1  The total time spent in the appointment was 55 minutes encounter with patients including review of chart and various tests results, discussions about plan of care and coordination of care plan   All questions were answered. The patient knows to call the clinic with any problems, questions or concerns. No barriers to learning was detected.  Jane Canary, MD 9/20/202312:04 PM   HISTORY OF PRESENTING ILLNESS:  Joseph Larson 66 y.o. male  with pmh of with past medical history of COPD, BPH, GERD, kidney stones, hypertension, hyperlipidemia, pancreatitis was referred to oncology clinic to discuss about adjuvant systemic treatment for stage Ib right upper lobe lung adenocarcinoma.  Patient was seen today accompanied by his wife.  He is slowly recovering from his surgery.  His blood pressure was elevated in the clinic and he will be seeing his cardiologist this afternoon.  Appetite and energy level is good.  Pain is improving.  He has not smoked since the diagnosis and was requesting for smoking cessation support group.  I have reviewed his chart and materials related to his cancer extensively and collaborated history with the patient. Summary of oncologic history is as follows: Oncology History  Adenocarcinoma, lung, right ([place])  10/02/2021 Imaging   Low dose CT lung screening  IMPRESSION: 1. Irregular 9 mm right upper lobe nodule, minimally enlarged from 07/07/2021 and essentially new from 07/07/2019. Findings are highly worrisome for primary bronchogenic carcinoma.  2. Mild subpleural reticulation and ground-glass without a definite zonal predominance, suspicious for interstitial lung disease. If further evaluation is desired, high-resolution chest CT without contrast is suggested.  MRI brain 10/04/2021 No evidence for metastasis   10/19/2021 PET scan   IMPRESSION: 1. Hypermetabolic 1 cm right upper lobe pulmonary nodule is most consistent with primary bronchogenic carcinoma. 2. No hypermetabolic thoracic adenopathy. 3. No evidence of hypermetabolic distant metastatic disease. 4. Non hypermetabolic distal esophageal wall thickening may reflect sequela of gastroesophageal reflux. 5. Moderate-sized fat containing left inguinal hernia. 6. Aortic Atherosclerosis (ICD10-I70.0) and Emphysema (ICD10-J43.9).   11/17/2021 Cancer Staging   Staging form: Lung, AJCC 8th Edition - Clinical stage from 11/17/2021: Stage IB (cT2a, cN0,  cM0) - Signed by Jane Canary, MD on 12/12/2021 Stage prefix: Initial diagnosis   11/17/2021 Definitive Surgery   PROCEDURE:  Xi robotic-assisted right upper lobe wedge resection, Right upper lobectomy, Lymph node dissection and Intercostal nerve blocks levels 3 through 10 by Dr. Roxan Hockey  Post op pneumothorax s/p chest tube. Developed Afib with RVR. On amiodarone. F/u with Cardiology.    11/17/2021 Pathology Results   A. LUNG, RIGHT UPPER LOBE, WEDGE RESECTION:  - Adenocarcinoma, 1.7 cm.  - Carcinoma involves pleural connective tissue (PL1) (pT2a).  - All surgical margins negative for carcinoma.  - See oncology table.   B. LYMPH NODE, LEVEL 8, EXCISION:  - Lymph node negative for metastatic carcinoma (0/1).   C. LYMPH NODE, LEVEL 11, EXCISION:  - Lymph node negative for metastatic carcinoma (0/1).   D. LYMPH NODE, 4R, EXCISION:  - Lymph node negative for metastatic carcinoma (0/1).   E. LYMPH NODE, 4R #2, EXCISION:  - Lymph node negative for metastatic carcinoma (0/1).   F. LYMPH NODE, LEVEL 12, EXCISION:  - Lymph node negative for metastatic carcinoma (0/1).   G. LYMPH NODE, LEVEL 10, EXCISION:  - Lymph node negative for metastatic carcinoma (0/1).   H. LYMPH NODE, LEVEL 12 #2, EXCISION:  - Lymph node negative for metastatic carcinoma (0/1).   I. LYMPH NODE, LEVEL 11, EXCISION:  -  Lymph node negative for metastatic carcinoma (0/1).   J. LUNG, RIGHT UPPER LOBE, LOBECTOMY:  - Findings consistent with previous wedge excision.  - No residual carcinoma identified.  - All surgical margins negative for carcinoma.  - Three lymph nodes negative for metastatic carcinoma (0/3).   K. LYMPH NODE, LEVEL 11 #2, EXCISION:  - Lymph node negative for metastatic carcinoma (0/1).   ONCOLOGY TABLE:  LUNG: Resection  Synchronous Tumors: Not applicable  Total Number of Primary Tumors: 1  Procedure: Right upper lobe wedge, right upper lobectomy and lymph node  biopsies  Specimen  Laterality: Right upper lung lobe  Tumor Focality: Unifocal  Tumor Site: Right upper lung lobe  Tumor Size: 1.7 x 1.3 x 1 cm  Histologic Type: Adenocarcinoma, acinar type  Visceral Pleura Invasion: See comment  Direct Invasion of Adjacent Structures: No adjacent structures present  Lymphovascular Invasion: Not identified  Margins: All surgical margins negative for invasive carcinoma       Closest Margin(s) to Invasive Carcinoma: Greater than 1 cm       Margin(s) Involved by Invasive Carcinoma: Not applicable        Margin Status for Non-Invasive Tumor: Not applicable  Treatment Effect: No known presurgical therapy  Regional Lymph Nodes:       Number of Lymph Nodes Involved: 0                            Nodal Sites with Tumor: 0       Number of Lymph Nodes Examined: 12                       Nodal Sites Examined: 4, 8, 10, 11, 12  Distant Metastasis:       Distant Site(s) Involved: Not applicable  Pathologic Stage Classification (pTNM, AJCC 8th Edition): pT2a, pN0      MEDICAL HISTORY:  Past Medical History:  Diagnosis Date   Anxiety    Arthritis    osteoarthritis   BPH (benign prostatic hyperplasia)    COPD (chronic obstructive pulmonary disease) (HCC)    Coronary atherosclerosis    ED (erectile dysfunction)    Elevated PSA    Erectile dysfunction    Esophageal spasm    GERD (gastroesophageal reflux disease)    History of kidney stones    passed stone   Hyperlipidemia    Hypertension    Neuralgia    Pancreatitis    Pneumonia    Rectus diastasis    Rhinitis    chronic    SURGICAL HISTORY: Past Surgical History:  Procedure Laterality Date   arthroscopic shoulder Left    CARDIAC CATHETERIZATION  2010   CHOLECYSTECTOMY     COLONOSCOPY WITH PROPOFOL N/A 07/01/2015   Procedure: COLONOSCOPY WITH PROPOFOL;  Surgeon: Manya Silvas, MD;  Location: [facility] ENDOSCOPY;  Service: Endoscopy;  Laterality: N/A;   ESOPHAGOGASTRODUODENOSCOPY (EGD) WITH PROPOFOL N/A 07/01/2015    Procedure: ESOPHAGOGASTRODUODENOSCOPY (EGD) WITH PROPOFOL;  Surgeon: Manya Silvas, MD;  Location: [facility] ENDOSCOPY;  Service: Endoscopy;  Laterality: N/A;   EUS  01/17/2012   Procedure: UPPER ENDOSCOPIC ULTRASOUND (EUS) LINEAR;  Surgeon: Milus Banister, MD;  Location: [facility] ENDOSCOPY;  Service: Endoscopy;  Laterality: N/A;  radial linear    EXCISION MORTON'S NEUROMA     Left Foot   HERNIA REPAIR     Right inguinal   HOLEP-LASER ENUCLEATION OF THE PROSTATE WITH MORCELLATION N/A 06/26/2019   Procedure:  HOLEP-LASER ENUCLEATION OF THE PROSTATE WITH MORCELLATION;  Surgeon: Billey Co, MD;  Location: [facility] ORS;  Service: Urology;  Laterality: N/A;   INTERCOSTAL NERVE BLOCK Right 11/17/2021   Procedure: INTERCOSTAL NERVE BLOCK;  Surgeon: [name], MD;  Location: [place];  Service: Thoracic;  Laterality: Right;   LYMPH NODE DISSECTION Right 11/17/2021   Procedure: LYMPH NODE DISSECTION;  Surgeon: [name], MD;  Location: [place];  Service: Thoracic;  Laterality: Right;   XI ROBOTIC ASSISTED THORACOSCOPY- SEGMENTECTOMY Right 11/17/2021   Procedure: XI ROBOTIC ASSISTED THORACOSCOPY WITH RIGHT UPPER LOBECTOMY;  Surgeon: [name], MD;  Location: [place];  Service: Thoracic;  Laterality: Right;    SOCIAL HISTORY: Social History   Socioeconomic History   Marital status: Married    Spouse name: Not on file   Number of children: Not on file   Years of education: Not on file   Highest education level: Not on file  Occupational History   Not on file  Tobacco Use   Smoking status: Every Day    Packs/day: 0.50    Years: 55.00    Total pack years: 27.50    Types: Cigarettes   Smokeless tobacco: Never  Vaping Use   Vaping Use: Never used  Substance and Sexual Activity   Alcohol use: No    Alcohol/week: 0.0 standard drinks of alcohol   Drug use: No   Sexual activity: Yes    Birth control/protection: None  Other Topics Concern   Not on file  Social History  Narrative   Not on file   Social Determinants of Health   Financial Resource Strain: Not on file  Food Insecurity: Not on file  Transportation Needs: Not on file  Physical Activity: Not on file  Stress: Not on file  Social Connections: Not on file  Intimate Partner Violence: Not on file    FAMILY HISTORY: Family History  Problem Relation Age of Onset   Prostate cancer Brother    Kidney disease Neg Hx    Kidney cancer Neg Hx    Bladder Cancer Neg Hx     ALLERGIES:  has No Known Allergies.  MEDICATIONS:  Current Outpatient Medications  Medication Sig Dispense Refill   acetaminophen (TYLENOL) 500 MG tablet Take 1,000 mg by mouth every 6 (six) hours as needed for moderate pain or headache.     albuterol (VENTOLIN HFA) 108 (90 Base) MCG/ACT inhaler Inhale 2 puffs into the lungs every 6 (six) hours as needed for wheezing or shortness of breath.     amiodarone (PACERONE) 200 MG tablet Take 1 tablet (200 mg total) by mouth 2 (two) times daily for 7 days, then decrease to 200 mg daily 60 tablet 1   cyclobenzaprine (FLEXERIL) 10 MG tablet Take 10 mg by mouth 3 (three) times daily as needed for muscle spasms.     fexofenadine (ALLEGRA) 180 MG tablet Take 180 mg by mouth daily as needed for allergies or rhinitis.     ibuprofen (ADVIL) 200 MG tablet Take 400 mg by mouth every 6 (six) hours as needed for headache or moderate pain.     meloxicam (MOBIC) 15 MG tablet Take 15 mg by mouth every evening.      metoprolol tartrate (LOPRESSOR) 25 MG tablet Take 1 tablet (25 mg total) by mouth 2 (two) times daily. 60 tablet 3   Oxycodone HCl 10 MG TABS Take 1 tablet (10 mg total) by mouth every 6 (six) hours as needed for severe  pain. 30 tablet 0   pantoprazole (PROTONIX) 40 MG tablet Take 40 mg by mouth every evening.     sildenafil (VIAGRA) 100 MG tablet Take 1 tablet (100 mg total) by mouth daily as needed for erectile dysfunction. 15 tablet 11   tamsulosin (FLOMAX) 0.4 MG CAPS capsule Take 1  capsule (0.4 mg total) by mouth daily. 30 capsule 3   No current facility-administered medications for this visit.    REVIEW OF SYSTEMS:   Pertinent information mentioned in HPI All other systems were reviewed with the patient and are negative.  PHYSICAL EXAMINATION: ECOG PERFORMANCE STATUS: 0 - Asymptomatic  Vitals:   12/12/21 1413  BP: (!) 183/92  Pulse: (!) 56  Resp: 18  Temp: (!) 97.5 F (36.4 C)   Filed Weights   12/12/21 1413  Weight: 201 lb 6.4 oz (91.4 kg)    GENERAL:alert, no distress and comfortable SKIN: skin color, texture, turgor are normal, no rashes or significant lesions EYES: normal, conjunctiva are pink and non-injected, sclera clear OROPHARYNX:no exudate, no erythema and lips, buccal mucosa, and tongue normal  NECK: supple, thyroid normal size, non-tender, without nodularity LYMPH:  no palpable lymphadenopathy in the cervical, axillary or inguinal LUNGS: clear to auscultation and percussion with normal breathing effort HEART: regular rate & rhythm and no murmurs and no lower extremity edema ABDOMEN:abdomen soft, non-tender and normal bowel sounds Musculoskeletal:no cyanosis of digits and no clubbing  PSYCH: alert & oriented x 3 with fluent speech NEURO: no focal motor/sensory deficits  LABORATORY DATA:  I have reviewed the data as listed Lab Results  Component Value Date   WBC 9.6 11/19/2021   HGB 12.8 (L) 11/19/2021   HCT 38.8 (L) 11/19/2021   MCV 94.4 11/19/2021   PLT 134 (L) 11/19/2021   Recent Labs    11/15/21 1004 11/18/21 0530 11/19/21 0516 11/22/21 0340  NA 138 136 138 137  K 4.5 4.3 4.2 4.1  CL 109 105 106 101  CO2 22 25 27 27   GLUCOSE 110* 130* 99 101*  BUN 22 16 17 10   CREATININE 1.07 1.07 1.08 0.95  CALCIUM 8.7* 8.1* 7.9* 8.5*  GFRNONAA >60 >60 >60 >60  PROT 7.0  --  5.6*  --   ALBUMIN 3.9  --  3.1*  --   AST 23  --  30  --   ALT 20  --  19  --   ALKPHOS 78  --  59  --   BILITOT 0.7  --  0.6  --     RADIOGRAPHIC  STUDIES: I have personally reviewed the radiological images as listed and agreed with the findings in the report. DG Chest 2 View  Result Date: 12/07/2021 CLINICAL DATA:  Lung nodule. EXAM: CHEST - 2 VIEW COMPARISON:  November 30, 2021 FINDINGS: Cardiomediastinal silhouette is normal. Mediastinal contours appear intact. There is no evidence of focal airspace consolidation, pleural effusion or pneumothorax. Osseous structures are without acute abnormality. Soft tissues are grossly normal. IMPRESSION: No active cardiopulmonary disease. Electronically Signed   By: Fidela Salisbury M.D.   On: 12/07/2021 15:14   DG Chest 2 View  Result Date: 11/30/2021 CLINICAL DATA:  S/P ROBOTIC UPPER LOBECTOMY EXAM: CHEST - 2 VIEW COMPARISON:  Radiograph 11/25/2021 FINDINGS: Unchanged cardiomediastinal silhouette. Postsurgical changes of right upper lobectomy. Stable right apical chest tube. There is no visible pneumothorax. No pleural effusion. No airspace disease. Mild degenerative changes of the thoracic spine. Subcutaneous emphysema along the right lower chest wall. IMPRESSION: Postsurgical changes  of right upper lobectomy. Stable right apical chest tube without evidence of pneumothorax. Electronically Signed   By: Maurine Simmering M.D.   On: 11/30/2021 16:39   DG Chest 2V REPEAT Same day  Result Date: 11/30/2021 CLINICAL DATA:  s/p lobectomy, chest tube now removed today EXAM: CHEST - 2 VIEW SAME DAY COMPARISON:  Same day radiograph FINDINGS: There is a small right apical pneumothorax after chest tube removal. Unchanged cardiomediastinal silhouette. Postsurgical changes of right right-sided lobectomy. No mediastinal shift. No airspace disease. No pleural effusion. Bones are unchanged. Subcutaneous emphysema along the right lower chest wall. IMPRESSION: Small right apical pneumothorax after chest tube removal. These results will be called to the ordering clinician or representative by the Radiologist Assistant, and  communication documented in the PACS or [employer]. Electronically Signed   By: Maurine Simmering M.D.   On: 11/30/2021 16:37   DG CHEST PORT 1 VIEW  Result Date: 11/25/2021 CLINICAL DATA:  Pneumothorax. Follow-up study. EXAM: PORTABLE CHEST 1 VIEW COMPARISON:  11/24/2021 and older exams. FINDINGS: No pneumothorax.  Stable right chest tube, tip at the right apex. Minor basilar atelectasis, stable.  Remainder of the lungs is clear. Right lateral chest wall subcutaneous emphysema is stable. IMPRESSION: 1. No convincing pneumothorax.  Stable right chest tube. 2. Minor stable basilar atelectasis. Electronically Signed   By: Lajean Manes M.D.   On: 11/25/2021 08:38   DG CHEST PORT 1 VIEW  Result Date: 11/24/2021 CLINICAL DATA:  Chest tube. EXAM: PORTABLE CHEST 1 VIEW COMPARISON:  Chest radiograph 11/23/2021 FINDINGS: Interval removal right IJ catheter. Right chest tube remains in position. Stable cardiac and mediastinal contours. Elevation right hemidiaphragm. Basilar atelectasis, unchanged. Possible small right apical pneumothorax, decreased from priors. IMPRESSION: Interval removal right IJ catheter. Right chest tube remains in place. Possible small right apical pneumothorax, decreased from priors. Basilar atelectasis. Electronically Signed   By: Lovey Newcomer M.D.   On: 11/24/2021 08:23   DG CHEST PORT 1 VIEW  Result Date: 11/23/2021 CLINICAL DATA:  Chest tube, post lobectomy EXAM: PORTABLE CHEST 1 VIEW COMPARISON:  Portable exam 0622 hours compared 11/22/2021 FINDINGS: RIGHT thoracostomy tube unchanged, tip at RIGHT apex. RIGHT jugular central venous catheter tip projects over SVC. Normal heart size, mediastinal contours, and pulmonary vascularity. Postsurgical changes and mild atelectasis at RIGHT base. Subsegmental atelectasis at LEFT base. Persistent small to moderate RIGHT apex pneumothorax. Remaining lungs clear without pulmonary infiltrate or pleural effusion. IMPRESSION: Stable postsurgical changes of  the RIGHT lung with persistent small to moderate RIGHT apex pneumothorax. Mild bibasilar atelectasis. Electronically Signed   By: Lavonia Dana M.D.   On: 11/23/2021 08:10   DG CHEST PORT 1 VIEW  Result Date: 11/22/2021 CLINICAL DATA:  Chest 2 EXAM: PORTABLE CHEST 1 VIEW COMPARISON:  Radiograph 11/21/2021 FINDINGS: Right neck catheter tip overlies the mid SVC, unchanged. Unchanged cardiomediastinal silhouette. Stable moderate size right apical pneumothorax with chest tube in place. Bibasilar atelectasis. No new airspace disease. No large effusion. Bones are unchanged. Unchanged subcutaneous emphysema along the right chest wall and lower neck. IMPRESSION: Stable moderate size right pneumothorax with chest tube in place. Electronically Signed   By: Maurine Simmering M.D.   On: 11/22/2021 08:13   DG CHEST PORT 1 VIEW  Result Date: 11/21/2021 CLINICAL DATA:  Status post lobectomy with chest tube present on the right EXAM: PORTABLE CHEST 1 VIEW COMPARISON:  Radiographs 11/20/2021 FINDINGS: Right IJ CVC tip in the mid SVC. Stable right chest tube. Postoperative change of right upper  lobectomy. Additional postoperative changes about the right lung base. Small-moderate right apical medial pneumothorax is new since 11/20/2021. No mediastinal shift. Remainder unchanged. Small left pleural effusion. Bibasilar atelectasis. Stable cardiomediastinal silhouette. Subcutaneous emphysema right chest wall. IMPRESSION: Right apical medial pneumothorax with stable position of the chest tube. Small left pleural effusion. Right chest wall emphysema. These results will be called to the ordering clinician or representative by the Radiologist Assistant, and communication documented in the PACS or [employer]. Electronically Signed   By: Placido Sou M.D.   On: 11/21/2021 08:37   DG CHEST PORT 1 VIEW  Result Date: 11/20/2021 CLINICAL DATA:  [id number].  Recent lobectomy right lung. EXAM: PORTABLE CHEST 1 VIEW COMPARISON:  Portable  chest 11/19/2021 at 5:16 a.m. FINDINGS: 4:17 a.m. chest tube with tip in the right apex is again noted. The cardiac size is normal. There is no visible pneumothorax. Patchy right chest wall emphysema is again shown. There is a stable mediastinum. Right IJ central line again has its tip in the mid SVC. Small left pleural effusion and overlying atelectasis are also similar. No focal pneumonia is seen. Postoperative changes are again noted at the right hilum. IMPRESSION: 1. No visible right pneumothorax with stable chest tube positioning. 2. Patchy right chest wall emphysema remains. 3. Unchanged small left pleural effusion and overlying atelectasis. Electronically Signed   By: Telford Nab M.D.   On: 11/20/2021 06:35   DG CHEST PORT 1 VIEW  Result Date: 11/19/2021 CLINICAL DATA:  Status post lobectomy of lung EXAM: PORTABLE CHEST 1 VIEW COMPARISON:  October 29, 2021 FINDINGS: The right pneumothorax on yesterday's study has resolved. No pneumothorax in either side of the chest. The right chest tube is stable. The right central line is stable. Postoperative changes on the right remain. A small amount of air in the subcutaneous tissues of the right chest and neck remain, similar to slightly increased. Small left effusion with associated atelectasis. No other interval change on today's study. IMPRESSION: 1. The previously identified right pneumothorax has resolved. 2. The small left effusion, mild left basilar atelectasis, and right basilar postoperative changes are stable. 3. Air in the soft tissues of the right chest wall and base of neck is mildly increased. Electronically Signed   By: Dorise Bullion III M.D.   On: 11/19/2021 08:05   DG Chest Port 1 View  Result Date: 11/18/2021 CLINICAL DATA:  Status post lobectomy of lung EXAM: PORTABLE CHEST 1 VIEW COMPARISON:  November 17, 2021 FINDINGS: There appears to be a small right apical pneumothorax. The right chest tube remains in place. The right central line is  stable. Postoperative changes are identified in the right base and right hilar regions. Small left effusion with underlying atelectasis. The cardiomediastinal silhouette is stable. No left-sided pneumothorax. No other interval changes. There is air in the lower right lateral chest wall soft tissues. IMPRESSION: 1. The right chest tube remains in place. There appears to be a small right apical pneumothorax not identified on yesterday's exam. Recommend attention on follow-up. No other changes. Electronically Signed   By: Dorise Bullion III M.D.   On: 11/18/2021 08:38   DG Chest Port 1 View  Result Date: 11/17/2021 CLINICAL DATA:  Status post right lung lobectomy. EXAM: PORTABLE CHEST 1 VIEW COMPARISON:  11/15/2021 FINDINGS: Normal sized heart. Interval right chest tube without pneumothorax. Interval right jugular catheter with its tip in the superior vena cava. Interval stable lines in the right hilar region and lateral right lower lung  zone. Interval mild patchy opacity in the left lower lobe. Moderate left glenohumeral joint degenerative changes. IMPRESSION: 1. Status post right lung surgery without pneumothorax. 2. Mild patchy atelectasis, pneumonia or aspiration pneumonitis in the left lower lobe. Electronically Signed   By: Claudie Revering M.D.   On: 11/17/2021 12:49   DG Chest 2 View  Result Date: 11/16/2021 CLINICAL DATA:  Pre-admission.  Prior lung surgery. EXAM: CHEST - 2 VIEW COMPARISON:  PET-CT October 19, 2021 FINDINGS: Normal cardiac and mediastinal contours. Right upper lobe pulmonary nodule. No large area pulmonary consolidation. No pleural effusion or pneumothorax. IMPRESSION: No acute process.  Right upper lobe pulmonary nodule. Electronically Signed   By: Lovey Newcomer M.D.   On: 11/16/2021 12:14

## 2021-12-12 NOTE — Progress Notes (Unsigned)
Patient here today for initial evaluation regarding lung cancer.

## 2021-12-20 ENCOUNTER — Encounter: Payer: Self-pay | Admitting: Urology

## 2021-12-20 ENCOUNTER — Ambulatory Visit: Payer: Medicare Other | Admitting: Urology

## 2021-12-20 VITALS — BP 182/77 | HR 59 | Ht 72.0 in | Wt 199.0 lb

## 2021-12-20 DIAGNOSIS — N401 Enlarged prostate with lower urinary tract symptoms: Secondary | ICD-10-CM | POA: Diagnosis not present

## 2021-12-20 DIAGNOSIS — N320 Bladder-neck obstruction: Secondary | ICD-10-CM

## 2021-12-20 DIAGNOSIS — N138 Other obstructive and reflux uropathy: Secondary | ICD-10-CM | POA: Diagnosis not present

## 2021-12-20 LAB — MICROSCOPIC EXAMINATION

## 2021-12-20 LAB — URINALYSIS, COMPLETE
Bilirubin, UA: NEGATIVE
Glucose, UA: NEGATIVE
Ketones, UA: NEGATIVE
Leukocytes,UA: NEGATIVE
Nitrite, UA: NEGATIVE
RBC, UA: NEGATIVE
Specific Gravity, UA: 1.03 (ref 1.005–1.030)
Urobilinogen, Ur: 0.2 mg/dL (ref 0.2–1.0)
pH, UA: 6 (ref 5.0–7.5)

## 2021-12-20 LAB — BLADDER SCAN AMB NON-IMAGING: Scan Result: 0

## 2021-12-20 NOTE — Progress Notes (Signed)
   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  He had been doing very well with no urinary complaints, and I last saw him in November 2021 when IPSS score was 2 and PVR was 67ml.  He recently underwent robotic assisted right upper lobe wedge resection at [facility] in [place] for lung cancer on 11/17/2021.  I reviewed those notes extensively.  Nurses were unable to place Foley in the operating room, and he reportedly developed urinary retention with inability to void in PACU.  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  The bladder neck was dilated to 4 [place], and they were ultimately able to pass a 12 [place] Foley over the wire.  His Foley was removed 1 week later.  He has had persistent urgency and frequency since that time around every hour and a half during the day and night.  Urinalysis today relatively benign with 0-5 WBCs, 0-2 RBC, moderate bacteria, no yeast, leukocyte negative, nitrate negative.  Will send for culture.  PVR is normal at 0 mL.  He was discharged on Flomax, but discontinued that medication yesterday, as he felt it could be contributing to his urgency/frequency.  I think it is reasonable to stop the Flomax and monitor his symptoms over the next few months.  I recommended avoiding bladder irritants.    At follow-up can consider OAB medication if persistent symptoms or cystoscopy for further evaluation of urethra/bladder neck anatomy. Return precautions discussed extensively.  Billey Co, [facility] [address], [place], [place] [address] [phone]

## 2021-12-24 LAB — CULTURE, URINE COMPREHENSIVE

## 2021-12-26 ENCOUNTER — Encounter: Payer: Self-pay | Admitting: Internal Medicine

## 2021-12-26 ENCOUNTER — Other Ambulatory Visit: Payer: Self-pay

## 2021-12-26 DIAGNOSIS — N138 Other obstructive and reflux uropathy: Secondary | ICD-10-CM

## 2021-12-26 MED ORDER — SULFAMETHOXAZOLE-TRIMETHOPRIM 800-160 MG PO TABS: 1.0000 | ORAL_TABLET | Freq: Two times a day (BID) | ORAL | 0 refills | Status: DC

## 2022-01-08 ENCOUNTER — Other Ambulatory Visit: Payer: Self-pay | Admitting: Thoracic Surgery (Cardiothoracic Vascular Surgery)

## 2022-01-08 DIAGNOSIS — C3491 Malignant neoplasm of unspecified part of right bronchus or lung: Secondary | ICD-10-CM

## 2022-01-09 ENCOUNTER — Ambulatory Visit (INDEPENDENT_AMBULATORY_CARE_PROVIDER_SITE_OTHER): Payer: Self-pay | Admitting: Thoracic Surgery (Cardiothoracic Vascular Surgery)

## 2022-01-09 ENCOUNTER — Ambulatory Visit
Admission: RE | Admit: 2022-01-09 | Discharge: 2022-01-09 | Disposition: A | Discharge status: Home / Self Care | Payer: Medicare Other | Source: Ambulatory Visit | Attending: Thoracic Surgery (Cardiothoracic Vascular Surgery) | Admitting: Thoracic Surgery (Cardiothoracic Vascular Surgery)

## 2022-01-09 ENCOUNTER — Encounter: Payer: Self-pay | Admitting: Thoracic Surgery (Cardiothoracic Vascular Surgery)

## 2022-01-09 VITALS — BP 168/83 | HR 90 | Resp 20 | Ht 72.0 in | Wt 201.4 lb

## 2022-01-09 DIAGNOSIS — C3491 Malignant neoplasm of unspecified part of right bronchus or lung: Secondary | ICD-10-CM

## 2022-01-09 DIAGNOSIS — Z09 Encounter for follow-up examination after completed treatment for conditions other than malignant neoplasm: Secondary | ICD-10-CM

## 2022-01-09 NOTE — Progress Notes (Signed)
[address]       [place] [address]             [phone]     HPI: Joseph Larson returns for a scheduled follow-up visit after his recent right upper lobectomy for a stage Ib non-small cell carcinoma.  Joseph Larson is a 66 year old man with a history of tobacco abuse, COPD, hypertension, hyperlipidemia, CAD, reflux, and arthritis.  He was found to have a lung nodule on a low-dose screening CT in April 2023.  On a follow-up scan the nodule increased in size.  On PET/CT it was hypermetabolic.  I did a robotic assisted right upper lobectomy on 11/17/2021.  The nodule turned out to be a T2, N0, stage Ib adenocarcinoma.  He did have an air leak postoperatively and went home with a chest tube but we were able to get that out in the office few days later.  Last saw him in the office on 12/07/2021.  He was feeling better at that time.  In the interim since his last visit he has continued to improve.  He has a little bit of soreness at the incisions but is not having to take any narcotics.  He does complain of increased mucus production since the surgery.He started Wellbutrin and has stopped smoking.    He did see Dr. Diamantina Larson and his urinary symptoms have improved but not completely resolved.    He saw Dr. Darrall Larson.  She did not recommend adjuvant chemotherapy.  Past Medical History:  Diagnosis Date   Anxiety    Arthritis    osteoarthritis   BPH (benign prostatic hyperplasia)    COPD (chronic obstructive pulmonary disease) (HCC)    Coronary atherosclerosis    ED (erectile dysfunction)    Elevated PSA    Erectile dysfunction    Esophageal spasm    GERD (gastroesophageal reflux disease)    History of kidney stones    passed stone   Hyperlipidemia    Hypertension    Neuralgia    Pancreatitis    Pneumonia    Rectus diastasis    Rhinitis    chronic     Current Outpatient Medications  Medication Sig Dispense Refill   acetaminophen (TYLENOL) 500 MG tablet Take  1,000 mg by mouth every 6 (six) hours as needed for moderate pain or headache.     albuterol (VENTOLIN HFA) 108 (90 Base) MCG/ACT inhaler Inhale 2 puffs into the lungs every 6 (six) hours as needed for wheezing or shortness of breath.     amiodarone (PACERONE) 200 MG tablet Take by mouth.     cyclobenzaprine (FLEXERIL) 10 MG tablet Take 10 mg by mouth 3 (three) times daily as needed for muscle spasms.     fexofenadine (ALLEGRA) 180 MG tablet Take 180 mg by mouth daily as needed for allergies or rhinitis.     ibuprofen (ADVIL) 200 MG tablet Take 400 mg by mouth every 6 (six) hours as needed for headache or moderate pain.     meloxicam (MOBIC) 15 MG tablet Take 15 mg by mouth every evening.      metoprolol tartrate (LOPRESSOR) 25 MG tablet Take 1 tablet (25 mg total) by mouth 2 (two) times daily. 60 tablet 3   Oxycodone HCl 10 MG TABS Take 1 tablet (10 mg total) by mouth every 6 (six) hours as needed for severe pain. 30 tablet 0   pantoprazole (PROTONIX) 40 MG tablet Take 40 mg by mouth every evening.  sildenafil (VIAGRA) 100 MG tablet Take 1 tablet (100 mg total) by mouth daily as needed for erectile dysfunction. 15 tablet 11   sulfamethoxazole-trimethoprim (BACTRIM DS) 800-160 MG tablet Take 1 tablet by mouth 2 (two) times daily. 14 tablet 0   No current facility-administered medications for this visit.    Physical Exam BP (!) 168/83 (BP Location: Left Arm, Patient Position: Sitting, Cuff Size: Normal)   Pulse 90   Resp 20   Ht 6' (1.829 m)   Wt 201 lb 6.4 oz (91.4 kg)   SpO2 96% Comment: RA  BMI 27.31 kg/m  66 year old man in no acute distress Alert and oriented x3 with no focal deficits Lungs slightly diminished at right base but otherwise clear Cardiac regular rate and rhythm normal S1 and S2 Incisions clean dry and intact  Diagnostic Tests: CHEST - 2 VIEW   COMPARISON:  Previous studies including the examination of 12/07/2021   FINDINGS: Cardiac size is within normal  limits. There are no signs of pulmonary edema or focal pulmonary consolidation. Small linear density in the medial right lower lung fields appears stable, possibly scarring. There is no pleural effusion or pneumothorax.   IMPRESSION: No active cardiopulmonary disease.     Electronically Signed   By: Joseph Larson M.D.   On: 01/09/2022 15:15 I personally reviewed the chest x-ray images.  Status post right upper lobectomy.  No active disease.  Impression: Joseph Larson is a 66 year old man with a history of tobacco abuse, COPD, hypertension, hyperlipidemia, CAD, reflux, and arthritis.  Stage Ib adenocarcinoma of the lung-status post right upper lobectomy.  He saw Dr. Doyne Larson.  No adjuvant therapy recommended at this time.  Will need follow-up.  She is scheduled to see him with a CT scan in 6 months.  Tobacco abuse-I congratulated him on quitting smoking.  Emphasized the importance of continued cessation.  Incisional pain-continues to improve.  Not requiring narcotics.  He does complain of increased mucus production.  That is not uncommon after surgical resection.  Also can happen when people first quit smoking.  Should improve with time.  No restrictions on his activities from my standpoint.  Plan: Follow-up with Dr. Raul Larson, Joseph Larson, and Joseph Larson I will be happy to see Joseph Larson back anytime in the future if I can be of any further assistance with his care  Joseph Nakayama, MD [facility] [phone]

## 2022-01-10 ENCOUNTER — Inpatient Hospital Stay: Payer: Medicare Other | Attending: Internal Medicine | Admitting: Hospice and Palliative Medicine

## 2022-01-10 DIAGNOSIS — C3491 Malignant neoplasm of unspecified part of right bronchus or lung: Secondary | ICD-10-CM

## 2022-01-10 NOTE — Progress Notes (Signed)
Multidisciplinary Oncology Council Documentation  Joseph Larson was presented by our [facility] on 01/10/2022, which included representatives from:  Palliative Care Dietitian  Physical/Occupational Therapist Nurse Navigator Genetics Speech Therapist Social work Survivorship RN Financial Navigator Research RN   [place] currently presents with history of lung cancer  We reviewed previous medical and familial history, history of present illness, and recent lab results along with all available histopathologic and imaging studies. The [facility] considered available treatment options and made the following recommendations/referrals:  None currently  The MOC is a meeting of clinicians from various specialty areas who evaluate and discuss patients for whom a multidisciplinary approach is being considered. Final determinations in the plan of care are those of the provider(s).   Today's extended care, comprehensive team conference, Perri was not present for the discussion and was not examined.

## 2022-02-08 ENCOUNTER — Ambulatory Visit: Payer: Medicare Other | Admitting: Urology

## 2022-05-08 ENCOUNTER — Other Ambulatory Visit: Payer: Self-pay | Admitting: Physician Assistant

## 2022-05-08 ENCOUNTER — Ambulatory Visit
Admission: RE | Admit: 2022-05-08 | Discharge: 2022-05-08 | Disposition: A | Discharge status: Home / Self Care | Payer: Medicare Other | Source: Ambulatory Visit | Attending: Physician Assistant | Admitting: Physician Assistant

## 2022-05-08 DIAGNOSIS — R1031 Right lower quadrant pain: Secondary | ICD-10-CM | POA: Diagnosis present

## 2022-05-08 DIAGNOSIS — R1013 Epigastric pain: Secondary | ICD-10-CM

## 2022-05-08 DIAGNOSIS — R1114 Bilious vomiting: Secondary | ICD-10-CM

## 2022-05-08 MED ORDER — IOHEXOL 300 MG/ML  SOLN
100.0000 mL | Freq: Once | INTRAMUSCULAR | Status: AC | PRN
  Administered 2022-05-08: 100 mL via INTRAVENOUS

## 2022-06-13 ENCOUNTER — Ambulatory Visit
Admission: RE | Admit: 2022-06-13 | Discharge: 2022-06-13 | Disposition: A | Discharge status: Home / Self Care | Payer: Medicare Other | Source: Ambulatory Visit | Attending: Internal Medicine | Admitting: Internal Medicine

## 2022-06-13 DIAGNOSIS — C3491 Malignant neoplasm of unspecified part of right bronchus or lung: Secondary | ICD-10-CM | POA: Insufficient documentation

## 2022-06-13 MED ORDER — IOHEXOL 300 MG/ML  SOLN
75.0000 mL | Freq: Once | INTRAMUSCULAR | Status: AC | PRN
  Administered 2022-06-13: 75 mL via INTRAVENOUS

## 2022-06-26 ENCOUNTER — Encounter: Payer: Self-pay | Admitting: Internal Medicine

## 2022-06-26 ENCOUNTER — Inpatient Hospital Stay: Payer: Medicare Other | Attending: Internal Medicine | Admitting: Internal Medicine

## 2022-06-26 VITALS — BP 160/99 | HR 73 | Temp 97.5°F | Resp 19 | Wt 209.0 lb

## 2022-06-26 DIAGNOSIS — C3411 Malignant neoplasm of upper lobe, right bronchus or lung: Secondary | ICD-10-CM | POA: Insufficient documentation

## 2022-06-26 DIAGNOSIS — C3491 Malignant neoplasm of unspecified part of right bronchus or lung: Secondary | ICD-10-CM | POA: Diagnosis not present

## 2022-06-26 DIAGNOSIS — Z87891 Personal history of nicotine dependence: Secondary | ICD-10-CM | POA: Diagnosis not present

## 2022-06-26 NOTE — Progress Notes (Signed)
[name] CONSULT NOTE  Patient Care Team: Idelle Crouch, MD as PCP - General (Internal Medicine)  REFERRING PROVIDER: Dr. Roxan Hockey  REASON FOR REFFERAL: Stage 1B RUL adenocarcinoma   CANCER STAGING   Cancer Staging  Adenocarcinoma, lung, right Staging form: Lung, AJCC 8th Edition - Clinical stage from 11/17/2021: Stage IB (cT2a, cN0, cM0) - Signed by Jane Canary, MD on 12/12/2021 Stage prefix: Initial diagnosis  ASSESSMENT & PLAN:  Joseph Larson 67 y.o. 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  # RUL lung adenocarcinoma, Stage Ib (pT2aN0) -Detected on low-dose CT lung screening being followed by Dr. Glenford Bayley of pulmonary.  - s/p Xi robotic-assisted right upper lobe wedge resection, Right upper lobectomy, Lymph node dissection and Intercostal nerve blocks levels 3 through 10 by Dr. Roxan Hockey on 11/17/2021  -Path report showed 1.7 cm adenocarcinoma, acinar type, involves visceral pleura (pT2a), margins negative, LVI negative, 0/12 lymph nodes involved with cancer.  Full report below.  - no role of adjuvant chemotherapy. Foundation one testing showed no targetable mutation.   - on surveillance. CT chest reviewed with the patient and wife. NED. There is esophageal thickening. Reports hx of GERD on pantoprazole. Repeat CT chest w contrast in 6 months.    RTC in 6 months for MD visit, labs and discuss CT scan   Orders Placed This Encounter  Procedures   CT Chest W Contrast    Standing Status:   Future    Standing Expiration Date:   12/26/2022    Order Specific Question:   If indicated for the ordered procedure, I authorize the administration of contrast media per Radiology protocol    Answer:   Yes    Order Specific Question:   Does the patient have a  contrast media/X-ray dye allergy?    Answer:   No    Order Specific Question:   Preferred imaging location?    Answer:   [facility]   CBC with Differential/Platelet    Standing Status:   Future    Standing Expiration Date:   06/26/2023   Comprehensive metabolic panel    Standing Status:   Future    Standing Expiration Date:   06/26/2023    The total time spent in the appointment was 30 minutes encounter with patients including review of chart and various tests results, discussions about plan of care and coordination of care plan   All questions were answered. The patient knows to call the clinic with any problems, questions or concerns. No barriers to learning was detected.  Jane Canary, MD 4/2/20242:20 PM   HISTORY OF PRESENTING ILLNESS:  Joseph Larson 67 y.o. male with pmh of with past medical history of COPD, BPH, GERD, kidney stones, hypertension, hyperlipidemia, pancreatitis follows with Oncology for Stage 1B right lung adenocarcinoma s/p resection on surveillance.   Patient had developed right-sided abdominal pain in February 2024.  He has history of pancreatitis had CT abdomen and pelvis which was unremarkable.  His lipase was also unremarkable.  Has been noticing steatorrhea.  I advised the patient to discuss about pancreatic enzyme supplements with his primary.  Reports history of acid reflux on pantoprazole.  He had shortness of breath on exertion at baseline.  Now has worsened postsurgery.  Albuterol helps but does not want to use much.  He tells me  that before going to the gym he uses albuterol and is able to do cardio for 20 minutes.  I have reviewed his chart and materials related to his cancer extensively and collaborated history with the patient. Summary of oncologic history is as follows: Oncology History  Adenocarcinoma, lung, right  10/02/2021 Imaging   Low dose CT lung screening  IMPRESSION: 1. Irregular 9 mm right upper lobe nodule, minimally enlarged  from 07/07/2021 and essentially new from 07/07/2019. Findings are highly worrisome for primary bronchogenic carcinoma.  2. Mild subpleural reticulation and ground-glass without a definite zonal predominance, suspicious for interstitial lung disease. If further evaluation is desired, high-resolution chest CT without contrast is suggested.  MRI brain 10/04/2021 No evidence for metastasis   10/19/2021 PET scan   IMPRESSION: 1. Hypermetabolic anterior right upper lobe pulmonary nodule measures 1 cm on image 93/2 with a max SUV 5.9. 2. No hypermetabolic thoracic adenopathy. 3. No evidence of hypermetabolic distant metastatic disease. 4. Non hypermetabolic distal esophageal wall thickening may reflect sequela of gastroesophageal reflux. 5. Moderate-sized fat containing left inguinal hernia. 6. Aortic Atherosclerosis (ICD10-I70.0) and Emphysema (ICD10-J43.9).   11/17/2021 Cancer Staging   Staging form: Lung, AJCC 8th Edition - Clinical stage from 11/17/2021: Stage IB (cT2a, cN0, cM0) - Signed by Jane Canary, MD on 12/12/2021 Stage prefix: Initial diagnosis   11/17/2021 Definitive Surgery   PROCEDURE:  Xi robotic-assisted right upper lobe wedge resection, Right upper lobectomy, Lymph node dissection and Intercostal nerve blocks levels 3 through 10 by Dr. Roxan Hockey  Post op pneumothorax s/p chest tube. Developed Afib with RVR. On amiodarone. F/u with Cardiology.    11/17/2021 Pathology Results   A. LUNG, RIGHT UPPER LOBE, WEDGE RESECTION:  - Adenocarcinoma, 1.7 cm.  - Carcinoma involves pleural connective tissue (PL1) (pT2a).  - All surgical margins negative for carcinoma.  - See oncology table.   B. LYMPH NODE, LEVEL 8, EXCISION:  - Lymph node negative for metastatic carcinoma (0/1).   C. LYMPH NODE, LEVEL 11, EXCISION:  - Lymph node negative for metastatic carcinoma (0/1).   D. LYMPH NODE, 4R, EXCISION:  - Lymph node negative for metastatic carcinoma (0/1).   E. LYMPH NODE, 4R  #2, EXCISION:  - Lymph node negative for metastatic carcinoma (0/1).   F. LYMPH NODE, LEVEL 12, EXCISION:  - Lymph node negative for metastatic carcinoma (0/1).   G. LYMPH NODE, LEVEL 10, EXCISION:  - Lymph node negative for metastatic carcinoma (0/1).   H. LYMPH NODE, LEVEL 12 #2, EXCISION:  - Lymph node negative for metastatic carcinoma (0/1).   I. LYMPH NODE, LEVEL 11, EXCISION:  - Lymph node negative for metastatic carcinoma (0/1).   J. LUNG, RIGHT UPPER LOBE, LOBECTOMY:  - Findings consistent with previous wedge excision.  - No residual carcinoma identified.  - All surgical margins negative for carcinoma.  - Three lymph nodes negative for metastatic carcinoma (0/3).   K. LYMPH NODE, LEVEL 11 #2, EXCISION:  - Lymph node negative for metastatic carcinoma (0/1).   ONCOLOGY TABLE:  LUNG: Resection  Synchronous Tumors: Not applicable  Total Number of Primary Tumors: 1  Procedure: Right upper lobe wedge, right upper lobectomy and lymph node  biopsies  Specimen Laterality: Right upper lung lobe  Tumor Focality: Unifocal  Tumor Site: Right upper lung lobe  Tumor Size: 1.7 x 1.3 x 1 cm  Histologic Type: Adenocarcinoma, acinar type  Visceral Pleura Invasion: See comment  Direct Invasion of Adjacent Structures: No adjacent structures present  Lymphovascular Invasion: Not identified  Margins: All surgical margins negative for invasive carcinoma       Closest Margin(s) to Invasive Carcinoma: Greater than 1 cm       Margin(s) Involved by Invasive Carcinoma: Not applicable        Margin Status for Non-Invasive Tumor: Not applicable  Treatment Effect: No known presurgical therapy  Regional Lymph Nodes:       Number of Lymph Nodes Involved: 0                            Nodal Sites with Tumor: 0       Number of Lymph Nodes Examined: 12                       Nodal Sites Examined: 4, 8, 10, 11, 12  Distant Metastasis:       Distant Site(s) Involved: Not applicable  Pathologic  Stage Classification (pTNM, AJCC 8th Edition): pT2a, pN0      MEDICAL HISTORY:  Past Medical History:  Diagnosis Date   Anxiety    Arthritis    osteoarthritis   BPH (benign prostatic hyperplasia)    COPD (chronic obstructive pulmonary disease)    Coronary atherosclerosis    ED (erectile dysfunction)    Elevated PSA    Erectile dysfunction    Esophageal spasm    GERD (gastroesophageal reflux disease)    History of kidney stones    passed stone   Hyperlipidemia    Hypertension    Neuralgia    Pancreatitis    Pneumonia    Rectus diastasis    Rhinitis    chronic    SURGICAL HISTORY: Past Surgical History:  Procedure Laterality Date   arthroscopic shoulder Left    CARDIAC CATHETERIZATION  2010   CHOLECYSTECTOMY     COLONOSCOPY WITH PROPOFOL N/A 07/01/2015   Procedure: COLONOSCOPY WITH PROPOFOL;  Surgeon: Manya Silvas, MD;  Location: [facility] ENDOSCOPY;  Service: Endoscopy;  Laterality: N/A;   ESOPHAGOGASTRODUODENOSCOPY (EGD) WITH PROPOFOL N/A 07/01/2015   Procedure: ESOPHAGOGASTRODUODENOSCOPY (EGD) WITH PROPOFOL;  Surgeon: Manya Silvas, MD;  Location: [facility] ENDOSCOPY;  Service: Endoscopy;  Laterality: N/A;   EUS  01/17/2012   Procedure: UPPER ENDOSCOPIC ULTRASOUND (EUS) LINEAR;  Surgeon: Milus Banister, MD;  Location: [facility] ENDOSCOPY;  Service: Endoscopy;  Laterality: N/A;  radial linear    EXCISION MORTON'S NEUROMA     Left Foot   HERNIA REPAIR     Right inguinal   HOLEP-LASER ENUCLEATION OF THE PROSTATE WITH MORCELLATION N/A 06/26/2019   Procedure: HOLEP-LASER ENUCLEATION OF THE PROSTATE WITH MORCELLATION;  Surgeon: Billey Co, MD;  Location: [facility] ORS;  Service: Urology;  Laterality: N/A;   INTERCOSTAL NERVE BLOCK Right 11/17/2021   Procedure: INTERCOSTAL NERVE BLOCK;  Surgeon: [name], MD;  Location: [facility];  Service: Thoracic;  Laterality: Right;   LYMPH NODE DISSECTION Right 11/17/2021   Procedure: LYMPH NODE DISSECTION;  Surgeon: [name],  MD;  [facility] OR;  Service: Thoracic;  Laterality: Right;   XI ROBOTIC ASSISTED THORACOSCOPY- SEGMENTECTOMY Right 11/17/2021   Procedure: XI ROBOTIC ASSISTED THORACOSCOPY WITH RIGHT UPPER LOBECTOMY;  Surgeon: [name], MD;  Location: [facility];  Service: Thoracic;  Laterality: Right;    SOCIAL HISTORY: Social History   Socioeconomic History   Marital status: Married    Spouse name: Not on file   Number of children: Not on file   Years of education:  Not on file   Highest education level: Not on file  Occupational History   Not on file  Tobacco Use   Smoking status: Former    Packs/day: 0.50    Years: 55.00    Additional pack years: 0.00    Total pack years: 27.50    Types: Cigarettes    Quit date: 12/17/2021    Years since quitting: 0.5   Smokeless tobacco: Never  Vaping Use   Vaping Use: Never used  Substance and Sexual Activity   Alcohol use: No    Alcohol/week: 0.0 standard drinks of alcohol   Drug use: No   Sexual activity: Yes    Birth control/protection: None  Other Topics Concern   Not on file  Social History Narrative   Not on file   Social Determinants of Health   Financial Resource Strain: Not on file  Food Insecurity: Not on file  Transportation Needs: Not on file  Physical Activity: Not on file  Stress: Not on file  Social Connections: Not on file  Intimate Partner Violence: Not on file    FAMILY HISTORY: Family History  Problem Relation Age of Onset   Prostate cancer Brother    Kidney disease Neg Hx    Kidney cancer Neg Hx    Bladder Cancer Neg Hx     ALLERGIES:  has No Known Allergies.  MEDICATIONS:  Current Outpatient Medications  Medication Sig Dispense Refill   acetaminophen (TYLENOL) 500 MG tablet Take 1,000 mg by mouth every 6 (six) hours as needed for moderate pain or headache.     albuterol (VENTOLIN HFA) 108 (90 Base) MCG/ACT inhaler Inhale 2 puffs into the lungs every 6 (six) hours as needed for wheezing or shortness of  breath.     meloxicam (MOBIC) 15 MG tablet Take 15 mg by mouth every evening.      metoprolol tartrate (LOPRESSOR) 25 MG tablet Take 1 tablet (25 mg total) by mouth 2 (two) times daily. 60 tablet 3   Oxycodone HCl 10 MG TABS Take 1 tablet (10 mg total) by mouth every 6 (six) hours as needed for severe pain. 30 tablet 0   pantoprazole (PROTONIX) 40 MG tablet Take 40 mg by mouth every evening.     sildenafil (VIAGRA) 100 MG tablet Take 1 tablet (100 mg total) by mouth daily as needed for erectile dysfunction. 15 tablet 11   amiodarone (PACERONE) 200 MG tablet Take by mouth. (Patient not taking: Reported on 06/26/2022)     cyclobenzaprine (FLEXERIL) 10 MG tablet Take 10 mg by mouth 3 (three) times daily as needed for muscle spasms. (Patient not taking: Reported on 06/26/2022)     fexofenadine (ALLEGRA) 180 MG tablet Take 180 mg by mouth daily as needed for allergies or rhinitis. (Patient not taking: Reported on 06/26/2022)     ibuprofen (ADVIL) 200 MG tablet Take 400 mg by mouth every 6 (six) hours as needed for headache or moderate pain. (Patient not taking: Reported on 06/26/2022)     sulfamethoxazole-trimethoprim (BACTRIM DS) 800-160 MG tablet Take 1 tablet by mouth 2 (two) times daily. (Patient not taking: Reported on 06/26/2022) 14 tablet 0   No current facility-administered medications for this visit.    REVIEW OF SYSTEMS:   Pertinent information mentioned in HPI All other systems were reviewed with the patient and are negative.  PHYSICAL EXAMINATION: ECOG PERFORMANCE STATUS: 0 - Asymptomatic  Vitals:   06/26/22 1355  BP: (!) 160/99  Pulse: 73  Resp: 19  Temp: (!)  97.5 F (36.4 C)  SpO2: 98%    Filed Weights   06/26/22 1355  Weight: 209 lb (94.8 kg)     GENERAL:alert, no distress and comfortable SKIN: skin color, texture, turgor are normal, no rashes or significant lesions EYES: normal, conjunctiva are pink and non-injected, sclera clear OROPHARYNX:no exudate, no erythema and lips,  buccal mucosa, and tongue normal  NECK: supple, thyroid normal size, non-tender, without nodularity LYMPH:  no palpable lymphadenopathy in the cervical, axillary or inguinal LUNGS: clear to auscultation and percussion with normal breathing effort HEART: regular rate & rhythm and no murmurs and no lower extremity edema ABDOMEN:abdomen soft, non-tender and normal bowel sounds Musculoskeletal:no cyanosis of digits and no clubbing  PSYCH: alert & oriented x 3 with fluent speech NEURO: no focal motor/sensory deficits  LABORATORY DATA:  I have reviewed the data as listed Lab Results  Component Value Date   WBC 9.6 11/19/2021   HGB 12.8 (L) 11/19/2021   HCT 38.8 (L) 11/19/2021   MCV 94.4 11/19/2021   PLT 134 (L) 11/19/2021   Recent Labs    11/15/21 1004 11/18/21 0530 11/19/21 0516 11/22/21 0340  NA 138 136 138 137  K 4.5 4.3 4.2 4.1  CL 109 105 106 101  CO2 22 25 27 27   GLUCOSE 110* 130* 99 101*  BUN 22 16 17 10   CREATININE 1.07 1.07 1.08 0.95  CALCIUM 8.7* 8.1* 7.9* 8.5*  GFRNONAA >60 >60 >60 >60  PROT 7.0  --  5.6*  --   ALBUMIN 3.9  --  3.1*  --   AST 23  --  30  --   ALT 20  --  19  --   ALKPHOS 78  --  59  --   BILITOT 0.7  --  0.6  --     RADIOGRAPHIC STUDIES: I have personally reviewed the radiological images as listed and agreed with the findings in the report. CT CHEST W CONTRAST  Result Date: 06/16/2022 CLINICAL DATA:  Staging non-small-cell lung cancer. Adenocarcinoma right lung. * Tracking Code: BO * EXAM: CT CHEST WITH CONTRAST TECHNIQUE: Multidetector CT imaging of the chest was performed during intravenous contrast administration. RADIATION DOSE REDUCTION: This exam was performed according to the departmental dose-optimization program which includes automated exposure control, adjustment of the mA and/or kV according to patient size and/or use of iterative reconstruction technique. CONTRAST:  88mL OMNIPAQUE IOHEXOL 300 MG/ML  SOLN COMPARISON:  PET-CT 10/19/2021.   Standard cc 10/02/2021 and older. FINDINGS: Cardiovascular: The heart is nonenlarged. No pericardial effusion. Coronary artery calcifications are seen. Thoracic aorta has a normal course and caliber. Mild atherosclerotic calcified plaque. Mediastinum/Nodes: No specific abnormal lymph node enlargement identified in the axillary region, hilum or mediastinum. Few small less than 1 cm in size in short axis nodes seen in the hila and mediastinum, not pathologic by size criteria. Slight wall thickening of the esophagus. Preserved thyroid gland. Lungs/Pleura: Surgical changes from right upper lobectomy. The previous mass lesion is no longer identified. Associated volume loss along the right hemithorax. No consolidation, pneumothorax or effusion. Bilateral emphysematous change with some peripheral areas of scarring and fibrotic changes as well. No recurrent mass lesion. Slight motion. Upper Abdomen: The adrenal glands are preserved in the upper abdomen. Diffuse fatty liver infiltration. Previous cholecystectomy. Few colonic diverticula. Musculoskeletal: Scattered degenerative changes along the spine. Multiple areas of bridging osteophyte formation. IMPRESSION: Interval surgical changes of right upper lobectomy. No recurrent or new mass lesion or lymph node enlargement. Slight wall  thickening of the esophagus. Please correlate with symptoms and additional workup as clinically directed. Fatty liver infiltration. Aortic Atherosclerosis (ICD10-I70.0) and Emphysema (ICD10-J43.9). Electronically Signed   By: Jill Side M.D.   On: 06/16/2022 11:44

## 2022-06-26 NOTE — Progress Notes (Signed)
Pt has a hx of pancreatitis and now dx with fatty liver, he is concerned with oily stool, loose, and floating stool.     Pt gets winded easily even with talking.

## 2022-12-12 ENCOUNTER — Ambulatory Visit
Admission: RE | Admit: 2022-12-12 | Discharge: 2022-12-12 | Disposition: A | Discharge status: Home / Self Care | Payer: Medicare Other | Source: Ambulatory Visit | Attending: Internal Medicine | Admitting: Internal Medicine

## 2022-12-12 DIAGNOSIS — C3491 Malignant neoplasm of unspecified part of right bronchus or lung: Secondary | ICD-10-CM | POA: Insufficient documentation

## 2022-12-12 MED ORDER — IOHEXOL 300 MG/ML  SOLN
75.0000 mL | Freq: Once | INTRAMUSCULAR | Status: AC | PRN
  Administered 2022-12-12: 75 mL via INTRAVENOUS

## 2022-12-19 ENCOUNTER — Other Ambulatory Visit: Payer: Medicare Other

## 2022-12-27 ENCOUNTER — Ambulatory Visit: Payer: Medicare Other | Admitting: Internal Medicine

## 2022-12-27 ENCOUNTER — Other Ambulatory Visit: Payer: Medicare Other

## 2023-01-03 ENCOUNTER — Inpatient Hospital Stay (HOSPITAL_BASED_OUTPATIENT_CLINIC_OR_DEPARTMENT_OTHER): Payer: Medicare Other | Admitting: Internal Medicine

## 2023-01-03 ENCOUNTER — Telehealth: Payer: Self-pay

## 2023-01-03 ENCOUNTER — Inpatient Hospital Stay: Payer: Medicare Other | Attending: Internal Medicine

## 2023-01-03 VITALS — BP 147/88 | HR 68 | Temp 97.6°F | Wt 209.0 lb

## 2023-01-03 DIAGNOSIS — C3491 Malignant neoplasm of unspecified part of right bronchus or lung: Secondary | ICD-10-CM

## 2023-01-03 DIAGNOSIS — M549 Dorsalgia, unspecified: Secondary | ICD-10-CM | POA: Diagnosis not present

## 2023-01-03 DIAGNOSIS — C3411 Malignant neoplasm of upper lobe, right bronchus or lung: Secondary | ICD-10-CM | POA: Insufficient documentation

## 2023-01-03 DIAGNOSIS — Z8042 Family history of malignant neoplasm of prostate: Secondary | ICD-10-CM | POA: Insufficient documentation

## 2023-01-03 DIAGNOSIS — Z87891 Personal history of nicotine dependence: Secondary | ICD-10-CM | POA: Diagnosis not present

## 2023-01-03 DIAGNOSIS — M1990 Unspecified osteoarthritis, unspecified site: Secondary | ICD-10-CM | POA: Diagnosis not present

## 2023-01-03 LAB — COMPREHENSIVE METABOLIC PANEL
ALT: 37 U/L (ref 0–44)
AST: 36 U/L (ref 15–41)
Albumin: 4 g/dL (ref 3.5–5.0)
Alkaline Phosphatase: 85 U/L (ref 38–126)
Anion gap: 9 (ref 5–15)
BUN: 20 mg/dL (ref 8–23)
CO2: 25 mmol/L (ref 22–32)
Calcium: 8.7 mg/dL — ABNORMAL LOW (ref 8.9–10.3)
Chloride: 104 mmol/L (ref 98–111)
Creatinine, Ser: 1.03 mg/dL (ref 0.61–1.24)
GFR, Estimated: >60 mL/min (ref >60)
Glucose, Bld: 147 mg/dL — ABNORMAL HIGH (ref 70–99)
Potassium: 4.4 mmol/L (ref 3.5–5.1)
Sodium: 138 mmol/L (ref 135–145)
Total Bilirubin: 1 mg/dL (ref 0.3–1.2)
Total Protein: 7.2 g/dL (ref 6.5–8.1)

## 2023-01-03 LAB — CBC WITH DIFFERENTIAL/PLATELET
Abs Immature Granulocytes: 0.02 10*3/uL (ref 0.00–0.07)
Basophils Absolute: 0.1 10*3/uL (ref 0.0–0.1)
Basophils Relative: 1 %
Eosinophils Absolute: 0.2 10*3/uL (ref 0.0–0.5)
Eosinophils Relative: 4 %
HCT: 42 % (ref 39.0–52.0)
Hemoglobin: 14.1 g/dL (ref 13.0–17.0)
Immature Granulocytes: 0 %
Lymphocytes Relative: 26 %
Lymphs Abs: 1.5 10*3/uL (ref 0.7–4.0)
MCH: 30.3 pg (ref 26.0–34.0)
MCHC: 33.6 g/dL (ref 30.0–36.0)
MCV: 90.3 fL (ref 80.0–100.0)
Monocytes Absolute: 0.5 10*3/uL (ref 0.1–1.0)
Monocytes Relative: 8 %
Neutro Abs: 3.5 10*3/uL (ref 1.7–7.7)
Neutrophils Relative %: 61 %
Platelets: 173 10*3/uL (ref 150–400)
RBC: 4.65 MIL/uL (ref 4.22–5.81)
RDW: 11.9 % (ref 11.5–15.5)
WBC: 5.8 10*3/uL (ref 4.0–10.5)
nRBC: 0 % (ref 0.0–0.2)

## 2023-01-03 NOTE — Progress Notes (Signed)
[facility] CONSULT NOTE  Patient Care Team: Marguarite Arbour, MD as PCP - General (Internal Medicine)  REFERRING PROVIDER: Dr. Dorris Fetch  REASON FOR REFFERAL: Stage 1B RUL adenocarcinoma   CANCER STAGING   Cancer Staging  Adenocarcinoma, lung, right [facility]) Staging form: Lung, AJCC 8th Edition - Clinical stage from 11/17/2021: Stage IB (cT2a, cN0, cM0) - Signed by Michaelyn Barter, MD on 12/12/2021 Stage prefix: Initial diagnosis  ASSESSMENT & PLAN:  Joseph Larson 67 y.o. 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  # RUL lung adenocarcinoma, Stage Ib (pT2aN0) -Detected on low-dose CT lung screening being followed by Dr. Shirlee More of pulmonary.  - s/p Xi robotic-assisted right upper lobe wedge resection, Right upper lobectomy, Lymph node dissection and Intercostal nerve blocks levels 3 through 10 by Dr. Dorris Fetch on 11/17/2021  -Path report showed 1.7 cm adenocarcinoma, acinar type, involves visceral pleura (pT2a), margins negative, LVI negative, 0/12 lymph nodes involved with cancer.  Full report below.  - no role of adjuvant chemotherapy. Foundation one testing showed no targetable mutation.   - on surveillance. CT chest from 12/12/2022 reviewed with the patient and wife. NED. Repeat Ct chest w contrast in 6 months.     RTC in 6 months for MD visit, discuss CT scan   Orders Placed This Encounter  Procedures   CT Chest W Contrast    Standing Status:   Future    Standing Expiration Date:   01/03/2024    Order Specific Question:   If indicated for the ordered procedure, I authorize the administration of contrast media per Radiology protocol    Answer:   Yes    Order Specific Question:   Does the patient have a contrast media/X-ray dye allergy?    Answer:   No     Order Specific Question:   Preferred imaging location?    Answer:   [facility]    The total time spent in the appointment was 30 minutes encounter with patients including review of chart and various tests results, discussions about plan of care and coordination of care plan   All questions were answered. The patient knows to call the clinic with any problems, questions or concerns. No barriers to learning was detected.  Michaelyn Barter, MD 10/10/20243:16 PM   HISTORY OF PRESENTING ILLNESS:  Joseph Larson 67 y.o. male with pmh of with past medical history of COPD, BPH, GERD, kidney stones, hypertension, hyperlipidemia, pancreatitis follows with Oncology for Stage 1B right lung adenocarcinoma s/p resection on surveillance.   Interval history Here accompained with wife to discuss scans.  Last couple of months has been worse with shortness of breath. Eg. SOB can happen when he is talking or exerting. But able to do gym and control his breathing. His primary has made referral to Cardiology.  Has chronic arthritis and back pain - started on Cymbalta by Rheumatology.    I have reviewed his chart and materials related to his cancer extensively and collaborated history with the patient. Summary of oncologic history is as follows: Oncology History  Adenocarcinoma, lung, right (HCC)  10/02/2021 Imaging   Low dose CT lung screening  IMPRESSION: 1. Irregular 9 mm right upper lobe nodule, minimally enlarged from 07/07/2021 and essentially new from 07/07/2019. Findings are highly worrisome for primary bronchogenic carcinoma.  2. Mild subpleural reticulation  and ground-glass without a definite zonal predominance, suspicious for interstitial lung disease. If further evaluation is desired, high-resolution chest CT without contrast is suggested.  MRI brain 10/04/2021 No evidence for metastasis   10/19/2021 PET scan   IMPRESSION: 1. Hypermetabolic anterior right upper lobe pulmonary  nodule measures 1 cm on image 93/2 with a max SUV 5.9. 2. No hypermetabolic thoracic adenopathy. 3. No evidence of hypermetabolic distant metastatic disease. 4. Non hypermetabolic distal esophageal wall thickening may reflect sequela of gastroesophageal reflux. 5. Moderate-sized fat containing left inguinal hernia. 6. Aortic Atherosclerosis (ICD10-I70.0) and Emphysema (ICD10-J43.9).   11/17/2021 Cancer Staging   Staging form: Lung, AJCC 8th Edition - Clinical stage from 11/17/2021: Stage IB (cT2a, cN0, cM0) - Signed by Michaelyn Barter, MD on 12/12/2021 Stage prefix: Initial diagnosis   11/17/2021 Definitive Surgery   PROCEDURE:  Xi robotic-assisted right upper lobe wedge resection, Right upper lobectomy, Lymph node dissection and Intercostal nerve blocks levels 3 through 10 by Dr. Dorris Fetch  Post op pneumothorax s/p chest tube. Developed Afib with RVR. On amiodarone. F/u with Cardiology.    11/17/2021 Pathology Results   A. LUNG, RIGHT UPPER LOBE, WEDGE RESECTION:  - Adenocarcinoma, 1.7 cm.  - Carcinoma involves pleural connective tissue (PL1) (pT2a).  - All surgical margins negative for carcinoma.  - See oncology table.   B. LYMPH NODE, LEVEL 8, EXCISION:  - Lymph node negative for metastatic carcinoma (0/1).   C. LYMPH NODE, LEVEL 11, EXCISION:  - Lymph node negative for metastatic carcinoma (0/1).   D. LYMPH NODE, 4R, EXCISION:  - Lymph node negative for metastatic carcinoma (0/1).   E. LYMPH NODE, 4R #2, EXCISION:  - Lymph node negative for metastatic carcinoma (0/1).   F. LYMPH NODE, LEVEL 12, EXCISION:  - Lymph node negative for metastatic carcinoma (0/1).   G. LYMPH NODE, LEVEL 10, EXCISION:  - Lymph node negative for metastatic carcinoma (0/1).   H. LYMPH NODE, LEVEL 12 #2, EXCISION:  - Lymph node negative for metastatic carcinoma (0/1).   I. LYMPH NODE, LEVEL 11, EXCISION:  - Lymph node negative for metastatic carcinoma (0/1).   J. LUNG, RIGHT UPPER LOBE,  LOBECTOMY:  - Findings consistent with previous wedge excision.  - No residual carcinoma identified.  - All surgical margins negative for carcinoma.  - Three lymph nodes negative for metastatic carcinoma (0/3).   K. LYMPH NODE, LEVEL 11 #2, EXCISION:  - Lymph node negative for metastatic carcinoma (0/1).   ONCOLOGY TABLE:  LUNG: Resection  Synchronous Tumors: Not applicable  Total Number of Primary Tumors: 1  Procedure: Right upper lobe wedge, right upper lobectomy and lymph node  biopsies  Specimen Laterality: Right upper lung lobe  Tumor Focality: Unifocal  Tumor Site: Right upper lung lobe  Tumor Size: 1.7 x 1.3 x 1 cm  Histologic Type: Adenocarcinoma, acinar type  Visceral Pleura Invasion: See comment  Direct Invasion of Adjacent Structures: No adjacent structures present  Lymphovascular Invasion: Not identified  Margins: All surgical margins negative for invasive carcinoma       Closest Margin(s) to Invasive Carcinoma: Greater than 1 cm       Margin(s) Involved by Invasive Carcinoma: Not applicable        Margin Status for Non-Invasive Tumor: Not applicable  Treatment Effect: No known presurgical therapy  Regional Lymph Nodes:       Number of Lymph Nodes Involved: 0  Nodal Sites with Tumor: 0       Number of Lymph Nodes Examined: 12                       Nodal Sites Examined: 4, 8, 10, 11, 12  Distant Metastasis:       Distant Site(s) Involved: Not applicable  Pathologic Stage Classification (pTNM, AJCC 8th Edition): pT2a, pN0      MEDICAL HISTORY:  Past Medical History:  Diagnosis Date   Anxiety    Arthritis    osteoarthritis   BPH (benign prostatic hyperplasia)    COPD (chronic obstructive pulmonary disease) (HCC)    Coronary atherosclerosis    ED (erectile dysfunction)    Elevated PSA    Erectile dysfunction    Esophageal spasm    GERD (gastroesophageal reflux disease)    History of kidney stones    passed stone    Hyperlipidemia    Hypertension    Neuralgia    Pancreatitis    Pneumonia    Rectus diastasis    Rhinitis    chronic    SURGICAL HISTORY: Past Surgical History:  Procedure Laterality Date   arthroscopic shoulder Left    CARDIAC CATHETERIZATION  2010   CHOLECYSTECTOMY     COLONOSCOPY WITH PROPOFOL N/A 07/01/2015   Procedure: COLONOSCOPY WITH PROPOFOL;  Surgeon: Scot Jun, MD;  Location: [facility] ENDOSCOPY;  Service: Endoscopy;  Laterality: N/A;   ESOPHAGOGASTRODUODENOSCOPY (EGD) WITH PROPOFOL N/A 07/01/2015   Procedure: ESOPHAGOGASTRODUODENOSCOPY (EGD) WITH PROPOFOL;  Surgeon: Scot Jun, MD;  Location: [facility] ENDOSCOPY;  Service: Endoscopy;  Laterality: N/A;   EUS  01/17/2012   Procedure: UPPER ENDOSCOPIC ULTRASOUND (EUS) LINEAR;  Surgeon: Rachael Fee, MD;  Location: [facility] ENDOSCOPY;  Service: Endoscopy;  Laterality: N/A;  radial linear    EXCISION MORTON'S NEUROMA     Left Foot   HERNIA REPAIR     Right inguinal   HOLEP-LASER ENUCLEATION OF THE PROSTATE WITH MORCELLATION N/A 06/26/2019   Procedure: HOLEP-LASER ENUCLEATION OF THE PROSTATE WITH MORCELLATION;  Surgeon: Sondra Come, MD;  Location: [facility] ORS;  Service: Urology;  Laterality: N/A;   INTERCOSTAL NERVE BLOCK Right 11/17/2021   Procedure: INTERCOSTAL NERVE BLOCK;  Surgeon: Loreli Slot, MD;  Location: [facility] OR;  Service: Thoracic;  Laterality: Right;   LYMPH NODE DISSECTION Right 11/17/2021   Procedure: LYMPH NODE DISSECTION;  Surgeon: Loreli Slot, MD;  Location: [facility] OR;  Service: Thoracic;  Laterality: Right;   XI ROBOTIC ASSISTED THORACOSCOPY- SEGMENTECTOMY Right 11/17/2021   Procedure: XI ROBOTIC ASSISTED THORACOSCOPY WITH RIGHT UPPER LOBECTOMY;  Surgeon: Loreli Slot, MD;  Location: [facility] OR;  Service: Thoracic;  Laterality: Right;    SOCIAL HISTORY: Social History   Socioeconomic History   Marital status: Married    Spouse name: Not on file   Number of children: Not on file   Years of  education: Not on file   Highest education level: Not on file  Occupational History   Not on file  Tobacco Use   Smoking status: Former    Current packs/day: 0.00    Average packs/day: 0.5 packs/day for 55.0 years (27.5 ttl pk-yrs)    Types: Cigarettes    Start date: 12/18/1966    Quit date: 12/17/2021    Years since quitting: 1.0   Smokeless tobacco: Never  Vaping Use   Vaping status: Never Used  Substance and Sexual Activity   Alcohol use: No    Alcohol/week: 0.0  standard drinks of alcohol   Drug use: No   Sexual activity: Yes    Birth control/protection: None  Other Topics Concern   Not on file  Social History Narrative   Not on file   Social Determinants of Health   Financial Resource Strain: Not on file  Food Insecurity: Not on file  Transportation Needs: Not on file  Physical Activity: Not on file  Stress: Not on file  Social Connections: Not on file  Intimate Partner Violence: Not on file    FAMILY HISTORY: Family History  Problem Relation Age of Onset   Prostate cancer Brother    Kidney disease Neg Hx    Kidney cancer Neg Hx    Bladder Cancer Neg Hx     ALLERGIES:  has No Known Allergies.  MEDICATIONS:  Current Outpatient Medications  Medication Sig Dispense Refill   acetaminophen (TYLENOL) 500 MG tablet Take 1,000 mg by mouth every 6 (six) hours as needed for moderate pain or headache.     albuterol (VENTOLIN HFA) 108 (90 Base) MCG/ACT inhaler Inhale 2 puffs into the lungs every 6 (six) hours as needed for wheezing or shortness of breath.     amiodarone (PACERONE) 200 MG tablet Take by mouth.     cyclobenzaprine (FLEXERIL) 10 MG tablet Take 10 mg by mouth 3 (three) times daily as needed for muscle spasms.     DULoxetine (CYMBALTA) 30 MG capsule Take by mouth.     fexofenadine (ALLEGRA) 180 MG tablet Take 180 mg by mouth daily as needed for allergies or rhinitis.     ibuprofen (ADVIL) 200 MG tablet Take 400 mg by mouth every 6 (six) hours as needed for  headache or moderate pain.     meloxicam (MOBIC) 15 MG tablet Take 15 mg by mouth every evening.      metoprolol tartrate (LOPRESSOR) 25 MG tablet Take 1 tablet (25 mg total) by mouth 2 (two) times daily. 60 tablet 3   Oxycodone HCl 10 MG TABS Take 1 tablet (10 mg total) by mouth every 6 (six) hours as needed for severe pain. 30 tablet 0   pantoprazole (PROTONIX) 40 MG tablet Take 40 mg by mouth every evening.     sildenafil (VIAGRA) 100 MG tablet Take 1 tablet (100 mg total) by mouth daily as needed for erectile dysfunction. 15 tablet 11   sulfamethoxazole-trimethoprim (BACTRIM DS) 800-160 MG tablet Take 1 tablet by mouth 2 (two) times daily. 14 tablet 0   No current facility-administered medications for this visit.    REVIEW OF SYSTEMS:   Pertinent information mentioned in HPI All other systems were reviewed with the patient and are negative.  PHYSICAL EXAMINATION: ECOG PERFORMANCE STATUS: 0 - Asymptomatic  Vitals:   01/03/23 1350  BP: (!) 147/88  Pulse: 68  Temp: 97.6 F (36.4 C)  SpO2: 97%    Filed Weights   01/03/23 1350  Weight: 209 lb (94.8 kg)     GENERAL:alert, no distress and comfortable SKIN: skin color, texture, turgor are normal, no rashes or significant lesions EYES: normal, conjunctiva are pink and non-injected, sclera clear OROPHARYNX:no exudate, no erythema and lips, buccal mucosa, and tongue normal  NECK: supple, thyroid normal size, non-tender, without nodularity LYMPH:  no palpable lymphadenopathy in the cervical, axillary or inguinal LUNGS: clear to auscultation and percussion with normal breathing effort HEART: regular rate & rhythm and no murmurs and no lower extremity edema ABDOMEN:abdomen soft, non-tender and normal bowel sounds Musculoskeletal:no cyanosis of digits and no  clubbing  PSYCH: alert & oriented x 3 with fluent speech NEURO: no focal motor/sensory deficits  LABORATORY DATA:  I have reviewed the data as listed Lab Results  Component  Value Date   WBC 5.8 01/03/2023   HGB 14.1 01/03/2023   HCT 42.0 01/03/2023   MCV 90.3 01/03/2023   PLT 173 01/03/2023   Recent Labs    01/03/23 1329  NA 138  K 4.4  CL 104  CO2 25  GLUCOSE 147*  BUN 20  CREATININE 1.03  CALCIUM 8.7*  GFRNONAA >60  PROT 7.2  ALBUMIN 4.0  AST 36  ALT 37  ALKPHOS 85  BILITOT 1.0    RADIOGRAPHIC STUDIES: I have personally reviewed the radiological images as listed and agreed with the findings in the report. CT Chest W Contrast  Result Date: 12/30/2022 CLINICAL DATA:  Follow-up non-small cell cancer. History of right upper lobe lobectomy 11/17/2021. * Tracking Code: BO * EXAM: CT CHEST WITH CONTRAST TECHNIQUE: Multidetector CT imaging of the chest was performed during intravenous contrast administration. RADIATION DOSE REDUCTION: This exam was performed according to the departmental dose-optimization program which includes automated exposure control, adjustment of the mA and/or kV according to patient size and/or use of iterative reconstruction technique. CONTRAST:  75mL OMNIPAQUE IOHEXOL 300 MG/ML  SOLN COMPARISON:  Multiple previous imaging studies. The most recent chest CT is 06/13/2022 FINDINGS: Cardiovascular: The heart is in size. No pericardial effusion. The aorta is normal in caliber. No dissection. Stable scattered atherosclerotic calcifications and scattered coronary artery calcifications. Mediastinum/Nodes: Small scattered mediastinal and hilar lymph nodes are stable. No mass or overt adenopathy the. The esophagus is unremarkable. Lungs/Pleura: Stable surgical changes from a right upper lobe lobectomy. No findings for recurrent tumor. No worrisome pulmonary lesions or pulmonary nodules. No pleural effusions or pleural nodules. Upper Abdomen: No findings for upper abdominal metastatic disease. No hepatic or adrenal gland lesions. No upper abdominal adenopathy. Diffuse fatty infiltration of the liver noted. Musculoskeletal: No significant bony  findings. IMPRESSION: 1. Stable surgical changes from a right upper lobe lobectomy. No findings for recurrent tumor or metastatic disease. 2. Stable small scattered mediastinal and hilar lymph nodes. No adenopathy. 3. Diffuse fatty infiltration of the liver. Electronically Signed   By: Rudie Meyer M.D.   On: 12/30/2022 09:28

## 2023-01-03 NOTE — Telephone Encounter (Signed)
Put in for the CT chest w contrast scan on 01/03/2023.

## 2023-01-03 NOTE — Progress Notes (Signed)
Patient saw his primary care provider this morning, and his provider told him that he is sending a referral to a cardiologist to rule out anything happening there, because patient is concerned about his shortness of breath/short windedness he has been having for a while now.

## 2023-05-27 ENCOUNTER — Ambulatory Visit
Admission: RE | Admit: 2023-05-27 | Discharge: 2023-05-27 | Disposition: A | Discharge status: Home / Self Care | Payer: Medicare Other | Source: Ambulatory Visit | Attending: Internal Medicine | Admitting: Internal Medicine

## 2023-05-27 DIAGNOSIS — C3491 Malignant neoplasm of unspecified part of right bronchus or lung: Secondary | ICD-10-CM | POA: Insufficient documentation

## 2023-05-27 MED ORDER — IOHEXOL 300 MG/ML  SOLN
75.0000 mL | Freq: Once | INTRAMUSCULAR | Status: AC | PRN
  Administered 2023-05-27: 75 mL via INTRAVENOUS

## 2023-06-10 ENCOUNTER — Encounter: Payer: Self-pay | Admitting: Nurse Practitioner

## 2023-06-10 ENCOUNTER — Inpatient Hospital Stay: Attending: Internal Medicine | Admitting: Nurse Practitioner

## 2023-06-10 VITALS — BP 131/74 | HR 86 | Temp 97.4°F | Resp 16 | Wt 208.0 lb

## 2023-06-10 DIAGNOSIS — Z902 Acquired absence of lung [part of]: Secondary | ICD-10-CM | POA: Insufficient documentation

## 2023-06-10 DIAGNOSIS — Z08 Encounter for follow-up examination after completed treatment for malignant neoplasm: Secondary | ICD-10-CM | POA: Diagnosis not present

## 2023-06-10 DIAGNOSIS — Z85118 Personal history of other malignant neoplasm of bronchus and lung: Secondary | ICD-10-CM

## 2023-06-10 NOTE — Progress Notes (Signed)
 [facility] CONSULT NOTE  Patient Care Team: Yehuda Helms, MD as PCP - General (Internal Medicine)  REFERRING PROVIDER: Dr. 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 male with pmh with past medical history of COPD, BPH, GERD, kidney stones, hypertension, hyperlipidemia, pancreatitis, diagnosed with stage IB adenocarcinoma of the lung who returns to clinic for surveillance and follow up.   # RUL lung adenocarcinoma, Stage Ib (pT2aN0) - Detected on low-dose CT lung screening being followed by Dr. Jamal Mays of pulmonary.  - s/p Xi robotic-assisted right upper lobe wedge resection, Right upper lobectomy, Lymph node dissection and Intercostal nerve blocks levels 3 through 10 by Dr. Luna Salinas on 11/17/2021  - Path report showed 1.7 cm adenocarcinoma, acinar type, involves visceral pleura (pT2a), margins negative, LVI negative, 0/12 lymph nodes involved with cancer.  Full report below.  - no role of adjuvant chemotherapy. Foundation one testing showed no targetable mutation.   - Has been on surveillance. CT chest from 05/27/23 was independently reviewed and no evidence of recurrent disease. We reviewed NCCN Surveillance guidelines for stage I disease including physical exam and chest ct every 6 mo for 2-3 years then low dose non-contrast enhanced ct annually. Congratulated on abstaining from tobacco. Reviewed symptoms that would be concerning for recurrence and warrant sooner evaluation. He will continue to follow up with his pcp for management of his other medical co-morbidities and wellness.   Disposition:  6 mo- ct chest w contrast 2 weeks  later- see Dr Aris Bel for lung cancer surveillance- [place]  No orders of the defined types were placed in this encounter.  The total time spent in the appointment was 20 minutes encounter with patients including review of chart and various tests results, discussions about plan of care and coordination of care plan  All questions were answered. The patient knows to call the clinic with any problems, questions or concerns. No barriers to learning was detected.  Nelda Balsam, NP 06/10/2023   HISTORY OF PRESENTING ILLNESS:  Joseph Larson 68 y.o. male with pmh of with past medical history of COPD, BPH, GERD, kidney stones, hypertension, hyperlipidemia, pancreatitis follows with Oncology for Stage 1B right lung adenocarcinoma s/p resection on surveillance.   Interval history Patient returns to clinic for ongoing surveillance. He feels well and denies complaints. No chest pain, shortness of breath or hemoptysis. Weight is stable. He has chronic aches and pains which are unchanged.   Summary of oncologic history is as follows: Oncology History  Adenocarcinoma, lung, right (HCC)  10/02/2021 Imaging   Low dose CT lung screening  IMPRESSION: 1. Irregular 9 mm right upper lobe nodule, minimally enlarged from 07/07/2021 and essentially new from 07/07/2019. Findings are highly worrisome for primary bronchogenic carcinoma.  2. Mild subpleural reticulation and ground-glass without a definite zonal predominance, suspicious for interstitial lung disease. If further evaluation is desired, high-resolution chest CT without contrast is suggested.  MRI brain 10/04/2021 No evidence for metastasis   10/19/2021 PET scan   IMPRESSION: 1. Hypermetabolic anterior right upper lobe pulmonary nodule measures 1 cm on image 93/2 with a max SUV 5.9. 2. No hypermetabolic thoracic adenopathy. 3. No evidence of  hypermetabolic distant metastatic disease. 4. Non hypermetabolic distal esophageal wall thickening may  reflect sequela of gastroesophageal reflux. 5. Moderate-sized fat containing left inguinal hernia. 6. Aortic Atherosclerosis (ICD10-I70.0) and Emphysema (ICD10-J43.9).   11/17/2021 Cancer Staging   Staging form: Lung, AJCC 8th Edition - Clinical stage from 11/17/2021: Stage IB (cT2a, cN0, cM0) - Signed by Agrawal, Kavita, MD on 12/12/2021 Stage prefix: Initial diagnosis   11/17/2021 Definitive Surgery   PROCEDURE:  Xi robotic-assisted right upper lobe wedge resection, Right upper lobectomy, Lymph node dissection and Intercostal nerve blocks levels 3 through 10 by Dr. Luna Salinas  Post op pneumothorax s/p chest tube. Developed Afib with RVR. On amiodarone . F/u with Cardiology.    11/17/2021 Pathology Results   A. LUNG, RIGHT UPPER LOBE, WEDGE RESECTION:  - Adenocarcinoma, 1.7 cm.  - Carcinoma involves pleural connective tissue (PL1) (pT2a).  - All surgical margins negative for carcinoma.  - See oncology table.   B. LYMPH NODE, LEVEL 8, EXCISION:  - Lymph node negative for metastatic carcinoma (0/1).   C. LYMPH NODE, LEVEL 11, EXCISION:  - Lymph node negative for metastatic carcinoma (0/1).   D. LYMPH NODE, 4R, EXCISION:  - Lymph node negative for metastatic carcinoma (0/1).   E. LYMPH NODE, 4R #2, EXCISION:  - Lymph node negative for metastatic carcinoma (0/1).   F. LYMPH NODE, LEVEL 12, EXCISION:  - Lymph node negative for metastatic carcinoma (0/1).   G. LYMPH NODE, LEVEL 10, EXCISION:  - Lymph node negative for metastatic carcinoma (0/1).   H. LYMPH NODE, LEVEL 12 #2, EXCISION:  - Lymph node negative for metastatic carcinoma (0/1).   I. LYMPH NODE, LEVEL 11, EXCISION:  - Lymph node negative for metastatic carcinoma (0/1).   J. LUNG, RIGHT UPPER LOBE, LOBECTOMY:  - Findings consistent with previous wedge excision.  - No residual carcinoma identified.  - All surgical margins negative for carcinoma.  - Three lymph nodes negative for metastatic carcinoma (0/3).   K. LYMPH  NODE, LEVEL 11 #2, EXCISION:  - Lymph node negative for metastatic carcinoma (0/1).   ONCOLOGY TABLE:  LUNG: Resection  Synchronous Tumors: Not applicable  Total Number of Primary Tumors: 1  Procedure: Right upper lobe wedge, right upper lobectomy and lymph node  biopsies  Specimen Laterality: Right upper lung lobe  Tumor Focality: Unifocal  Tumor Site: Right upper lung lobe  Tumor Size: 1.7 x 1.3 x 1 cm  Histologic Type: Adenocarcinoma, acinar type  Visceral Pleura Invasion: See comment  Direct Invasion of Adjacent Structures: No adjacent structures present  Lymphovascular Invasion: Not identified  Margins: All surgical margins negative for invasive carcinoma       Closest Margin(s) to Invasive Carcinoma: Greater than 1 cm       Margin(s) Involved by Invasive Carcinoma: Not applicable        Margin Status for Non-Invasive Tumor: Not applicable  Treatment Effect: No known presurgical therapy  Regional Lymph Nodes:       Number of Lymph Nodes Involved: 0                            Nodal Sites with Tumor: 0       Number of Lymph Nodes Examined: 12                       Nodal Sites Examined: 4, 8, 10, 11, 12  Distant Metastasis:       Distant Site(s) Involved:  Not applicable  Pathologic Stage Classification (pTNM, AJCC 8th Edition): pT2a, pN0      MEDICAL HISTORY:  Past Medical History:  Diagnosis Date   Anxiety    Arthritis    osteoarthritis   BPH (benign prostatic hyperplasia)    COPD (chronic obstructive pulmonary disease) (HCC)    Coronary atherosclerosis    ED (erectile dysfunction)    Elevated PSA    Erectile dysfunction    Esophageal spasm    GERD (gastroesophageal reflux disease)    History of kidney stones    passed stone   Hyperlipidemia    Hypertension    Neuralgia    Pancreatitis    Pneumonia    Rectus diastasis    Rhinitis    chronic    SURGICAL HISTORY: Past Surgical History:  Procedure Laterality Date   arthroscopic shoulder Left    CARDIAC  CATHETERIZATION  2010   CHOLECYSTECTOMY     COLONOSCOPY WITH PROPOFOL  N/A 07/01/2015   Procedure: COLONOSCOPY WITH PROPOFOL ;  Surgeon: Cassie Click, MD;  Location: [facility] ENDOSCOPY;  Service: Endoscopy;  Laterality: N/A;   ESOPHAGOGASTRODUODENOSCOPY (EGD) WITH PROPOFOL  N/A 07/01/2015   Procedure: ESOPHAGOGASTRODUODENOSCOPY (EGD) WITH PROPOFOL ;  Surgeon: Cassie Click, MD;  Location: [facility] ENDOSCOPY;  Service: Endoscopy;  Laterality: N/A;   EUS  01/17/2012   Procedure: UPPER ENDOSCOPIC ULTRASOUND (EUS) LINEAR;  Surgeon: Janel Medford, MD;  Location: [facility] ENDOSCOPY;  Service: Endoscopy;  Laterality: N/A;  radial linear    EXCISION MORTON'S NEUROMA     Left Foot   HERNIA REPAIR     Right inguinal   HOLEP-LASER ENUCLEATION OF THE PROSTATE WITH MORCELLATION N/A 06/26/2019   Procedure: HOLEP-LASER ENUCLEATION OF THE PROSTATE WITH MORCELLATION;  Surgeon: Lawerence Pressman, MD;  Location: [facility] ORS;  Service: Urology;  Laterality: N/A;   INTERCOSTAL NERVE BLOCK Right 11/17/2021   Procedure: INTERCOSTAL NERVE BLOCK;  Surgeon: Zelphia Higashi, MD;  Location: [facility] OR;  Service: Thoracic;  Laterality: Right;   LYMPH NODE DISSECTION Right 11/17/2021   Procedure: LYMPH NODE DISSECTION;  Surgeon: Zelphia Higashi, MD;  Location: [facility] OR;  Service: Thoracic;  Laterality: Right;   XI ROBOTIC ASSISTED THORACOSCOPY- SEGMENTECTOMY Right 11/17/2021   Procedure: XI ROBOTIC ASSISTED THORACOSCOPY WITH RIGHT UPPER LOBECTOMY;  Surgeon: Zelphia Higashi, MD;  Location: [facility] OR;  Service: Thoracic;  Laterality: Right;    SOCIAL HISTORY: Social History   Socioeconomic History   Marital status: Married    Spouse name: Not on file   Number of children: Not on file   Years of education: Not on file   Highest education level: Not on file  Occupational History   Not on file  Tobacco Use   Smoking status: Former    Current packs/day: 0.00    Average packs/day: 0.5 packs/day for 55.0 years (27.5 ttl pk-yrs)     Types: Cigarettes    Start date: 12/18/1966    Quit date: 12/17/2021    Years since quitting: 1.4   Smokeless tobacco: Never  Vaping Use   Vaping status: Never Used  Substance and Sexual Activity   Alcohol use: No    Alcohol/week: 0.0 standard drinks of alcohol   Drug use: No   Sexual activity: Yes    Birth control/protection: None  Other Topics Concern   Not on file  Social History Narrative   Not on file   Social Drivers of Health   Financial Resource Strain: Low Risk  (05/01/2023)   [facility]   Overall Financial Resource Strain (CARDIA)    Difficulty of Paying Living Expenses: Not hard at all  Food Insecurity: No Food Insecurity (05/01/2023)   Received from [facility]   Hunger Vital Sign    Worried About Running Out of Food in the Last Year: Never true    Ran Out of Food in the Last Year: Never true  Transportation Needs: No Transportation Needs (05/01/2023)   Received from [facility]    In the past 12 months, has lack of transportation kept you from medical appointments or from getting medications?: No    Lack of Transportation (Non-Medical): No  Physical Activity: Not on file  Stress: Not on file  Social Connections: Not on file  Intimate Partner Violence: Not on file    FAMILY HISTORY: Family History  Problem Relation Age of Onset   Prostate cancer Brother    Kidney disease Neg Hx    Kidney cancer Neg Hx    Bladder Cancer Neg Hx     ALLERGIES:  has no known allergies.  MEDICATIONS:  Current Outpatient Medications  Medication Sig Dispense Refill   acetaminophen  (TYLENOL ) 500 MG tablet Take 1,000 mg by mouth every 6 (six) hours as needed for moderate pain or headache.     albuterol  (VENTOLIN  HFA) 108 (90 Base) MCG/ACT inhaler Inhale 2 puffs into the lungs every 6 (six) hours as needed for wheezing or shortness of breath.     amiodarone  (PACERONE ) 200 MG tablet Take by  mouth.     cyclobenzaprine  (FLEXERIL ) 10 MG tablet Take 10 mg by mouth 3 (three) times daily as needed for muscle spasms.     DULoxetine (CYMBALTA) 30 MG capsule Take by mouth.     fexofenadine (ALLEGRA) 180 MG tablet Take 180 mg by mouth daily as needed for allergies or rhinitis.     ibuprofen (ADVIL) 200 MG tablet Take 400 mg by mouth every 6 (six) hours as needed for headache or moderate pain.     meloxicam  (MOBIC ) 15 MG tablet Take 15 mg by mouth every evening.      Oxycodone  HCl 10 MG TABS Take 1 tablet (10 mg total) by mouth every 6 (six) hours as needed for severe pain. 30 tablet 0   pantoprazole  (PROTONIX ) 40 MG tablet Take 40 mg by mouth every evening.     sildenafil  (VIAGRA ) 100 MG tablet Take 1 tablet (100 mg total) by mouth daily as needed for erectile dysfunction. 15 tablet 11   metoprolol  tartrate (LOPRESSOR ) 25 MG tablet Take 1 tablet (25 mg total) by mouth 2 (two) times daily. (Patient not taking: Reported on 06/10/2023) 60 tablet 3   sulfamethoxazole -trimethoprim  (BACTRIM  DS) 800-160 MG tablet Take 1 tablet by mouth 2 (two) times daily. (Patient not taking: Reported on 06/10/2023) 14 tablet 0   No current facility-administered medications for this visit.    REVIEW OF SYSTEMS:   Review of Systems  Constitutional:  Negative for chills, fever, malaise/fatigue and weight loss.  HENT:  Negative for hearing loss, nosebleeds, sore throat and tinnitus.   Eyes:  Negative for blurred vision and double vision.  Respiratory:  Negative for cough, hemoptysis, shortness of breath and wheezing.   Cardiovascular:  Negative for chest pain, palpitations and leg swelling.  Gastrointestinal:  Negative for abdominal pain, blood in stool, constipation, diarrhea, melena, nausea and vomiting.  Genitourinary:  Negative for dysuria and urgency.  Musculoskeletal:  Negative for back pain,  falls, joint pain and myalgias.  Skin:  Negative for itching and rash.  Neurological:  Negative for dizziness,  tingling, sensory change, loss of consciousness, weakness and headaches.  Endo/Heme/Allergies:  Negative for environmental allergies. Does not bruise/bleed easily.  Psychiatric/Behavioral:  Negative for depression. The patient is not nervous/anxious and does not have insomnia.    PHYSICAL EXAMINATION: ECOG PERFORMANCE STATUS: 0 - Asymptomatic Vitals:   06/10/23 1448  BP: 131/74  Pulse: 86  Resp: 16  Temp: (!) 97.4 F (36.3 C)  SpO2: 98%   Filed Weights   06/10/23 1448  Weight: 208 lb (94.3 kg)   General: Well-developed, well-nourished, no acute distress. Eyes: Pink conjunctiva, anicteric sclera. Lungs: Clear to auscultation bilaterally.  No audible wheezing or coughing Heart: Regular rate and rhythm.  Abdomen: Soft, nontender, nondistended.  Musculoskeletal: No edema, cyanosis, or clubbing. Neuro: Alert, answering all questions appropriately. Cranial nerves grossly intact. Skin: No rashes or petechiae noted. Psych: Normal affect.   LABORATORY DATA:  No labs on site today   RADIOGRAPHIC STUDIES: I have personally reviewed the radiological images as listed and agreed with the findings in the report. CT Chest W Contrast Result Date: 06/10/2023 CLINICAL DATA:  Adenocarcinoma of the right lung.  Screening. EXAM: CT CHEST WITH CONTRAST TECHNIQUE: Multidetector CT imaging of the chest was performed during intravenous contrast administration. RADIATION DOSE REDUCTION: This exam was performed according to the departmental dose-optimization program which includes automated exposure control, adjustment of the mA and/or kV according to patient size and/or use of iterative reconstruction technique. CONTRAST:  75mL OMNIPAQUE  IOHEXOL  300 MG/ML  SOLN COMPARISON:  Chest CT dated 12/12/2022. FINDINGS: Cardiovascular: There is no cardiomegaly or pericardial effusion. There is coronary vascular calcification. Mild atherosclerotic calcification of the thoracic aorta. No aneurysmal dilatation or  dissection. The origins of the great vessels of the aortic arch appear patent. No pulmonary artery embolus identified. Mediastinum/Nodes: No hilar or mediastinal adenopathy. The esophagus is grossly unremarkable. No mediastinal fluid collection. Lungs/Pleura: Postsurgical changes of right upper lobectomy. No focal consolidation, pleural effusion, pneumothorax. The central airways are patent. Upper Abdomen: Fatty liver.  Cholecystectomy. Musculoskeletal: Degenerative changes of the spine. No acute osseous pathology. IMPRESSION: 1. Postsurgical changes of right upper lobectomy. No evidence of recurrent or metastatic disease. 2. Fatty liver. Electronically Signed   By: Angus Bark M.D.   On: 06/10/2023 14:42

## 2023-06-10 NOTE — Progress Notes (Signed)
 Patient is doing good, besides the shortness of breath, which he is seeing a pulmonologist, and they are trying a couple different things. He had a CT scan on 05/27/2023. He would like to start seeing Dr. Donneta Romberg on Dr .Alan Ripper last day.

## 2023-07-04 ENCOUNTER — Ambulatory Visit: Payer: Medicare Other | Admitting: Internal Medicine

## 2023-07-05 ENCOUNTER — Ambulatory Visit: Admitting: Pulmonary Disease

## 2023-07-05 ENCOUNTER — Encounter: Payer: Self-pay | Admitting: Pulmonary Disease

## 2023-07-05 VITALS — BP 112/70 | HR 73 | Temp 97.6°F | Ht 72.0 in

## 2023-07-05 DIAGNOSIS — R0602 Shortness of breath: Secondary | ICD-10-CM

## 2023-07-05 DIAGNOSIS — J449 Chronic obstructive pulmonary disease, unspecified: Secondary | ICD-10-CM

## 2023-07-05 DIAGNOSIS — R0683 Snoring: Secondary | ICD-10-CM | POA: Diagnosis not present

## 2023-07-05 DIAGNOSIS — M4810 Ankylosing hyperostosis [Forestier], site unspecified: Secondary | ICD-10-CM

## 2023-07-05 DIAGNOSIS — Z87891 Personal history of nicotine dependence: Secondary | ICD-10-CM

## 2023-07-05 DIAGNOSIS — R0981 Nasal congestion: Secondary | ICD-10-CM

## 2023-07-05 DIAGNOSIS — Z9189 Other specified personal risk factors, not elsewhere classified: Secondary | ICD-10-CM

## 2023-07-05 DIAGNOSIS — C3491 Malignant neoplasm of unspecified part of right bronchus or lung: Secondary | ICD-10-CM

## 2023-07-05 MED ORDER — BREZTRI AEROSPHERE 160-9-4.8 MCG/ACT IN AERO: 2.0000 | INHALATION_SPRAY | Freq: Two times a day (BID) | RESPIRATORY_TRACT | Status: AC

## 2023-07-05 NOTE — Progress Notes (Signed)
 Subjective:    Patient ID: Joseph Larson, male    DOB: 1955/04/23, 68 y.o.   MRN: [id number]  Patient Care Team: Marguarite Arbour, MD as PCP - General (Internal Medicine) Earna Coder, MD as Consulting Physician (Oncology) Alinda Dooms, NP as Nurse Practitioner (Nurse Practitioner)  Chief Complaint  Patient presents with   Consult    Cough with occasional dark mucus. Shortness of breath on exertion. Wheezing.     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  He has a history of DISH and is followed by rheumatology.  Recently followed by Dr. Meredeth Ide.  Patient is kindly referred by Consuello Masse, NP.   HPI Discussed the use of AI scribe software for clinical note transcription with the patient, who gave verbal consent to proceed.  History of Present Illness   Joseph Larson is a 68 year old male with COPD who presents with worsening shortness of breath. He is accompanied by an unspecified individual for support. He was referred by Consuello Masse, NP for evaluation of COPD.  He has a history of COPD, initially diagnosed around the age of 71 and classified as stage I (mild). At that time, he was asymptomatic and used Ventolin primarily before gym sessions. Over time, CT scans revealed concerning nodules, a particular right upper lobe nodule led to surgical intervention for right upper lobectomy.  He was noted to have adenocarcinoma.  He underwent thoracic surgery in 2023.  Cancer was stage Ib and did not require chemotherapy afterwards.  Approximately six months ago, he began experiencing worsening shortness of breath, particularly during physical activities like walking and gym workouts, necessitating frequent breaks. This prompted a reevaluation, and his COPD was upgraded to stage II  (moderate). He was prescribed Trelegy, which he used for a short period but discontinued due to cost and lack of significant improvement. He continues to use Ventolin as needed.  He notes easy fatigability.  He was started on Cymbalta for issues with pain related to his DISH.  His activity level has significantly decreased over the past six months due to fatigue and shortness of breath. He experiences episodes of wheezing and rattling at night, which improve after coughing up mucus, particularly in the mornings. He attributes this to sinus drainage. He also experiences loud snoring, as noted by his wife, and requires extended sleep durations, often up to 12 hours, to feel rested.  He has a history of elevated blood sugar levels, which he has been managing with his primary care physician. He acknowledges a lifelong habit of consuming sugary foods, which he finds difficult to resist.     He is retired from [employer].  He is a former smoker quitting in the summer 2023 after his thoracotomy.  Cardiac evaluation in 2024 did not show any major findings.  Pulmonary function testing performed in December 2024 by Dr. Meredeth Ide showed FEV1 of 2.12 L or 60% predicted, FVC of 3.62 L or 75% predicted, FEV1/FVC 58% consistent with moderate obstruction.  Review of Systems A 10 point review of systems was performed and it is as noted above otherwise negative.   Past Medical History:  Diagnosis Date   Anxiety    Arthritis    osteoarthritis   BPH (benign prostatic hyperplasia)    COPD (chronic obstructive pulmonary  disease) (HCC)    Coronary atherosclerosis    ED (erectile dysfunction)    Elevated PSA    Erectile dysfunction    Esophageal spasm    GERD (gastroesophageal reflux disease)    History of kidney stones    passed stone   Hyperlipidemia    Hypertension    Neuralgia    Pancreatitis    Pneumonia    Rectus diastasis    Rhinitis    chronic    Past Surgical History:  Procedure Laterality  Date   arthroscopic shoulder Left    CARDIAC CATHETERIZATION  2010   CHOLECYSTECTOMY     COLONOSCOPY WITH PROPOFOL N/A 07/01/2015   Procedure: COLONOSCOPY WITH PROPOFOL;  Surgeon: Scot Jun, MD;  Location: [facility] ENDOSCOPY;  Service: Endoscopy;  Laterality: N/A;   ESOPHAGOGASTRODUODENOSCOPY (EGD) WITH PROPOFOL N/A 07/01/2015   Procedure: ESOPHAGOGASTRODUODENOSCOPY (EGD) WITH PROPOFOL;  Surgeon: Scot Jun, MD;  Location: [facility] ENDOSCOPY;  Service: Endoscopy;  Laterality: N/A;   EUS  01/17/2012   Procedure: UPPER ENDOSCOPIC ULTRASOUND (EUS) LINEAR;  Surgeon: Rachael Fee, MD;  Location: [facility] ENDOSCOPY;  Service: Endoscopy;  Laterality: N/A;  radial linear    EXCISION MORTON'S NEUROMA     Left Foot   HERNIA REPAIR     Right inguinal   HOLEP-LASER ENUCLEATION OF THE PROSTATE WITH MORCELLATION N/A 06/26/2019   Procedure: HOLEP-LASER ENUCLEATION OF THE PROSTATE WITH MORCELLATION;  Surgeon: Sondra Come, MD;  Location: [facility] ORS;  Service: Urology;  Laterality: N/A;   INTERCOSTAL NERVE BLOCK Right 11/17/2021   Procedure: INTERCOSTAL NERVE BLOCK;  Surgeon: Loreli Slot, MD;  Location: [facility] OR;  Service: Thoracic;  Laterality: Right;   LYMPH NODE DISSECTION Right 11/17/2021   Procedure: LYMPH NODE DISSECTION;  Surgeon: Loreli Slot, MD;  Location: [facility] OR;  Service: Thoracic;  Laterality: Right;   XI ROBOTIC ASSISTED THORACOSCOPY- SEGMENTECTOMY Right 11/17/2021   Procedure: XI ROBOTIC ASSISTED THORACOSCOPY WITH RIGHT UPPER LOBECTOMY;  Surgeon: Loreli Slot, MD;  Location: [facility] OR;  Service: Thoracic;  Laterality: Right;    Patient Active Problem List   Diagnosis Date Noted   S/P Robotic Assisted Right Upper Lobectomy, Intercostal Nerve Block, Lymph Node Dissection 11/23/2021   Lung nodule 11/17/2021   Adenocarcinoma, lung, right (HCC) 11/17/2021   Osteoarthritis of left glenohumeral joint 05/27/2017   Other specified health status 09/05/2015   Right flank pain  03/08/2015   BPH with obstruction/lower urinary tract symptoms 09/06/2014   Elevated PSA 09/06/2014   Other male erectile dysfunction 09/06/2014   Acute inflammation of the pancreas 08/18/2014   Alcohol drinker 08/18/2014   Angina pectoris (HCC) 08/18/2014   Anxiety 08/18/2014   Cervical pain 08/18/2014   Chronic rhinitis 08/18/2014   Diastasis recti 08/18/2014   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   Acid reflux 08/18/2014   Headache, migraine 08/18/2014   Abdominal hernia 08/18/2014   Hemorrhoid 08/18/2014   HLD (hyperlipidemia) 08/18/2014   BP (high blood pressure) 08/18/2014   Low back pain 08/18/2014   Nerve pain 08/18/2014   Arthritis, degenerative 08/18/2014   Compulsive tobacco user syndrome 08/18/2014   Lumbar canal stenosis 07/07/2014   Neuritis or radiculitis due to rupture of lumbar intervertebral disc 05/14/2014   DDD (degenerative disc disease), lumbar 05/03/2014   Low back strain 05/03/2014   Abnormal prostate specific antigen 01/25/2014   Pancreatitis 01/17/2012    Family History  Problem Relation Age of Onset   Prostate cancer Brother  Kidney disease Neg Hx    Kidney cancer Neg Hx    Bladder Cancer Neg Hx     Social History   Tobacco Use   Smoking status: Former    Current packs/day: 0.00    Average packs/day: 0.5 packs/day for 55.0 years (27.5 ttl pk-yrs)    Types: Cigarettes    Start date: 12/18/1966    Quit date: 12/17/2021    Years since quitting: 1.5   Smokeless tobacco: Never  Substance Use Topics   Alcohol use: No    Alcohol/week: 0.0 standard drinks of alcohol    No Known Allergies  Current Meds  Medication Sig   acetaminophen (TYLENOL) 500 MG tablet Take 1,000 mg by mouth every 6 (six) hours as needed for moderate pain or headache.   albuterol (VENTOLIN HFA) 108 (90 Base) MCG/ACT inhaler Inhale 2 puffs into the lungs every 6 (six) hours as needed for wheezing or shortness of  breath.   amiodarone (PACERONE) 200 MG tablet Take by mouth.   budeson-glycopyrrolate-formoterol (BREZTRI AEROSPHERE) 160-9-4.8 MCG/ACT AERO inhaler Inhale 2 puffs into the lungs in the morning and at bedtime.   celecoxib (CELEBREX) 100 MG capsule Take 100 mg by mouth 2 (two) times daily.   cyclobenzaprine (FLEXERIL) 10 MG tablet Take 10 mg by mouth 3 (three) times daily as needed for muscle spasms.   DULoxetine (CYMBALTA) 60 MG capsule Take 60 mg by mouth daily.   fexofenadine (ALLEGRA) 180 MG tablet Take 180 mg by mouth daily as needed for allergies or rhinitis.   fluticasone (FLONASE) 50 MCG/ACT nasal spray Place 2 sprays into both nostrils daily.   ibuprofen (ADVIL) 200 MG tablet Take 400 mg by mouth every 6 (six) hours as needed for headache or moderate pain.   Oxycodone HCl 10 MG TABS Take 1 tablet (10 mg total) by mouth every 6 (six) hours as needed for severe pain.   pantoprazole (PROTONIX) 40 MG tablet Take 40 mg by mouth every evening.    Immunization History  Administered Date(s) Administered   PFIZER(Purple Top)SARS-COV-2 Vaccination 05/09/2019, 06/02/2019        Objective:     BP 112/70 (BP Location: Left Arm, Patient Position: Sitting, Cuff Size: Normal)   Pulse 73   Temp 97.6 F (36.4 C) (Temporal)   Ht 6' (1.829 m)   SpO2 98%   BMI 28.21 kg/m   SpO2: 98 %  GENERAL: Well-developed, overweight gentleman, no acute distress.  Mild conversational dyspnea.  Fully ambulatory. HEAD: Normocephalic, atraumatic.  EYES: Pupils equal, round, reactive to light.  No scleral icterus.  NOSE: Significant turbinate edema. MOUTH: Dentition intact, oral mucosa moist.  Mallampati III airway. NECK: Supple. No thyromegaly. Trachea midline. No JVD.  No adenopathy. PULMONARY: Slightly diminished air entry bilaterally.  Coarse, otherwise, no adventitious sounds. CARDIOVASCULAR: S1 and S2. Regular rate and rhythm.  No rubs, murmurs or gallops heard. ABDOMEN: Mildly protuberant,  otherwise benign. MUSCULOSKELETAL: No joint deformity, no clubbing, no edema.  NEUROLOGIC: No overt focal deficit, no gait disturbance, speech is fluent. SKIN: Intact,warm,dry. PSYCH: Mildly depressed mood, normal behavior.      07/05/2023   10:35 AM  Results of the Epworth flowsheet  Sitting and reading 2  Watching TV 0  Sitting, inactive in a public place (e.g. a theatre or a meeting) 1  As a passenger in a car for an hour without a break 2  Lying down to rest in the afternoon when circumstances permit 3  Sitting and talking to someone 0  Sitting quietly after a lunch without alcohol 1  In a car, while stopped for a few minutes in traffic 0  Total score 9   Ambulatory oxymetry was performed today:  At rest on room air oxygen saturation was 97%, the patient ambulated at a normal pace, completed 3 laps, O2 nadir 95%, transient, settling at 96%.  Old shortness of breath.  Resting heart rate was 76 bpm at maximum for this exercise 90 bpm.    Assessment & Plan:     ICD-10-CM   1. Shortness of breath  R06.02 Pulmonary function test    2. Stage 2 moderate COPD by GOLD classification (HCC)  J44.9 Pulmonary function test    3. Adenocarcinoma, lung, right (HCC)  C34.91     4. DISH (diffuse idiopathic skeletal hyperostosis)  M48.10     5. At risk for sleep apnea  Z91.89 Home sleep test      Orders Placed This Encounter  Procedures   Pulmonary function test    Standing Status:   Future    Expected Date:   07/19/2023    Expiration Date:   07/04/2024    Where should this test be performed?:   Outpatient Pulmonary    What type of PFT is being ordered?:   Full PFT    MIP/MEP:   Yes   Home sleep test    Standing Status:   Future    Expected Date:   07/19/2023    Expiration Date:   07/04/2024    Where should this test be performed::   [facility] - Pulmonary    Meds ordered this encounter  Medications   budeson-glycopyrrolate-formoterol (BREZTRI AEROSPHERE) 160-9-4.8 MCG/ACT AERO inhaler     Sig: Inhale 2 puffs into the lungs in the morning and at bedtime.    Dispense:  2 each    Lot Number?:   [id number]    Expiration Date?:   11/24/2025    Manufacturer?:   [employer] [[id number]]    NDC:   1610-9604-54 [098119]    Quantity:   2   Discussion:    Chronic Obstructive Pulmonary Disease (COPD) COPD progressed from mild to moderate over the past six months, with increased dyspnea and occasional nocturnal wheezing. Previous treatments with Ventolin and Trelegy were ineffective. Current symptoms include exertional dyspnea and reduced activity level. Breztri inhaler trial proposed for potential improvement. Pulmonary rehabilitation considered if symptoms persist.  Patient needs repeat PFTs as known lung volumes are available. - Order repeat pulmonary function test - Prescribe Breztri inhaler, two puffs twice daily, with mouth rinsing post-use - Continue albuterol as needed - Consider pulmonary rehabilitation if symptoms persist after current interventions  Sleep Apnea Suspected sleep apnea due to loud snoring and excessive daytime somnolence despite 10-12 hours of sleep. Home sleep study planned for diagnostic confirmation. - Order home sleep study - Sleep apnea will make his joint pain more pronounced  Nasal Congestion Intermittent nasal congestion likely due to sinus drainage and seasonal allergies. Inconsistent Flonase use noted. Regular Flonase use advised during pollen season. - Advise regular use of Flonase during pollen season     DISH This issue adds complexity to his management.  May be also affecting lung volumes given that this can create a stiff thoracic cage that may affect ventilation.  This is also associated with lower lung volumes.   Advised if symptoms do not improve or worsen, to please contact office for sooner follow up or seek emergency care.    I spent 60 minutes  of dedicated to the care of this patient on the date of this encounter to include pre-visit review  of records, face-to-face time with the patient discussing conditions above, post visit ordering of testing, clinical documentation with the electronic health record, making appropriate referrals as documented, and communicating necessary findings to members of the patients care team.   C. Danice Goltz, MD Advanced Bronchoscopy PCCM [facility] [facility]    *This note was dictated using voice recognition software/Dragon.  Despite best efforts to proofread, errors can occur which can change the meaning. Any transcriptional errors that result from this process are unintentional and may not be fully corrected at the time of dictation.

## 2023-07-05 NOTE — Patient Instructions (Addendum)
 VISIT SUMMARY:  Today, you were seen for worsening shortness of breath related to your COPD. We discussed your history and current symptoms, including your decreased activity level and nighttime wheezing. We also addressed your loud snoring and need for extended sleep, which may indicate sleep apnea. Additionally, we talked about your nasal congestion, likely due to sinus drainage and seasonal allergies.  YOUR PLAN:  -CHRONIC OBSTRUCTIVE PULMONARY DISEASE (COPD): COPD is a chronic lung condition that makes it hard to breathe. Your COPD has progressed from stage one (mild) to stage two (moderate), causing increased shortness of breath and reduced activity. We will start you on a new inhaler called Breztri, which you should use two puffs twice daily and rinse your mouth after each use. Continue using your albuterol inhaler as needed. We will also repeat your pulmonary function test and consider pulmonary rehabilitation if your symptoms do not improve.  Let us know how you do with the Markus Daft so we can send in a prescription for you.  -SLEEP APNEA: Sleep apnea is a condition where your breathing stops and starts during sleep, often causing loud snoring and excessive daytime sleepiness. We suspect you may have sleep apnea and have ordered a home sleep study to confirm the diagnosis.  -NASAL CONGESTION: Your nasal congestion is likely due to sinus drainage and seasonal allergies. We recommend you use Flonase regularly during pollen season to help manage your symptoms.  INSTRUCTIONS:  Please follow up with the home sleep study as ordered. Continue using your new Breztri inhaler as prescribed and monitor your symptoms. If your shortness of breath does not improve, we may consider pulmonary rehabilitation. Use Flonase regularly during pollen season to manage your nasal congestion. We will repeat your pulmonary function test to assess your lung function.  Will see you in follow-up in 4 to 6 weeks time call  sooner should any new problems arise.

## 2023-08-05 ENCOUNTER — Encounter

## 2023-08-05 DIAGNOSIS — Z9189 Other specified personal risk factors, not elsewhere classified: Secondary | ICD-10-CM

## 2023-08-14 ENCOUNTER — Telehealth: Payer: Self-pay | Admitting: Pulmonary Disease

## 2023-08-14 NOTE — Telephone Encounter (Signed)
 Needs PFT scheduled for 08/27/2023 at 9:00 am and follow up with Dr. Viva Grise at 10:30 am.

## 2023-08-15 ENCOUNTER — Ambulatory Visit: Admitting: Pulmonary Disease

## 2023-08-20 DIAGNOSIS — R0683 Snoring: Secondary | ICD-10-CM | POA: Diagnosis not present

## 2023-08-27 ENCOUNTER — Ambulatory Visit: Admitting: Pulmonary Disease

## 2023-08-27 ENCOUNTER — Encounter: Payer: Self-pay | Admitting: Pulmonary Disease

## 2023-08-27 VITALS — BP 122/70 | HR 72 | Temp 97.1°F | Ht 72.0 in | Wt 203.0 lb

## 2023-08-27 DIAGNOSIS — J449 Chronic obstructive pulmonary disease, unspecified: Secondary | ICD-10-CM | POA: Diagnosis not present

## 2023-08-27 DIAGNOSIS — Z9189 Other specified personal risk factors, not elsewhere classified: Secondary | ICD-10-CM

## 2023-08-27 DIAGNOSIS — C3491 Malignant neoplasm of unspecified part of right bronchus or lung: Secondary | ICD-10-CM

## 2023-08-27 DIAGNOSIS — R0602 Shortness of breath: Secondary | ICD-10-CM

## 2023-08-27 DIAGNOSIS — M4810 Ankylosing hyperostosis [Forestier], site unspecified: Secondary | ICD-10-CM | POA: Diagnosis not present

## 2023-08-27 DIAGNOSIS — Z87891 Personal history of nicotine dependence: Secondary | ICD-10-CM

## 2023-08-27 DIAGNOSIS — J329 Chronic sinusitis, unspecified: Secondary | ICD-10-CM

## 2023-08-27 NOTE — Progress Notes (Signed)
 Full PFT completed today with MIP/MEP

## 2023-08-27 NOTE — Patient Instructions (Signed)
 Full PFT completed today with MIP/MEP

## 2023-08-27 NOTE — Progress Notes (Signed)
 Subjective:    Patient ID: Joseph Larson, male    DOB: 04-21-1955, 68 y.o.   MRN: [id number]  Patient Care Team: Yehuda Helms, MD as PCP - General (Internal Medicine) Gwyn Leos, MD as Consulting Physician (Oncology) Nelda Balsam, NP as Nurse Practitioner (Nurse Practitioner)  Chief Complaint  Patient presents with   Follow-up    SOB. No wheezing. Cough in the morning with clear sputum. PFT today.    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 He has a history of DISH and is followed by rheumatology. Recently followed by Dr. Jamal Mays.  Patient was initially seen here on 05 July 2023.  HPI Discussed the use of AI scribe software for clinical note transcription with the patient, who gave verbal consent to proceed.  History of Present Illness Joseph Larson is a 68 year old male who presents with ongoing sleep disturbances and daytime fatigue.  He experiences significant sleep disturbances characterized by prolonged sleep duration and excessive daytime fatigue. He can sleep from 10 or 11 PM until 10 or 11 AM, or even noon, without difficulty, yet often feels the need to return to bed shortly after waking. No spontaneous sleep episodes while standing, but he can easily fall asleep in a recliner in the late afternoon for one to two hours.  He has a history of positive sleep studies but has been unable to tolerate CPAP therapy due to discomfort with the nasal apparatus, which often dislodges during sleep. He recalls a previous in-lab sleep study experience as uncomfortable due to the numerous attachments and his tendency to toss and turn.  He reports a long-standing history of feeling tired despite adequate sleep, dating back to his twenties, when employers would  comment on his apparent lack of rest.  He uses an albuterol  inhaler primarily before gym workouts but sometimes forgets to use it without noticing a significant difference in his breathing. He has tried a medication similar to Trelegy but found it ineffective and noted it altered his taste and increased mucus production.  He experiences nasal congestion and uses Flonase  intermittently, having tried it daily for three weeks without significant improvement. He has not seen an ear, nose, and throat specialist for this issue.  DATA 01/24/2023 echo stress test Ivette Marks): Normal stress echo, no wall motion abnormalities at rest can peak exercise.  Patient achieved 7 METS during exercise. 03/15/2023 PFTs Ivette Marks): FEV1 of 2.12 L or 60% predicted, FVC of 3.62 L or 75% predicted, FEV1/FVC 58% consistent with moderate obstruction.  08/27/2023 PFTs: FEV1 2.56 L or 72% predicted, FVC 3.71 L or 77% predicted, FEV1/FVC 69%, lung volumes at lower normal limits, diffusion capacity mildly reduced but corrects by alveolar volume.  Mild restrictive physiology with concomitant mild obstructive physiology.  Restrictive physiology likely due to prior lobectomy.  Overall function improved from prior PFTs from 15 March 2023.  Review of Systems A 10 point review of systems was performed and it is as noted above otherwise negative.   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/06/2014   Elevated PSA 09/06/2014   Other male erectile dysfunction 09/06/2014   Acute inflammation of the pancreas 08/18/2014   Alcohol drinker 08/18/2014   Angina pectoris  (HCC) 08/18/2014   Anxiety 08/18/2014   Cervical pain 08/18/2014   Chronic rhinitis 08/18/2014   Diastasis recti 08/18/2014   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   Acid reflux 08/18/2014   Headache, migraine 08/18/2014   Abdominal hernia 08/18/2014   Hemorrhoid 08/18/2014   HLD (hyperlipidemia) 08/18/2014   BP (high blood pressure) 08/18/2014   Low back pain 08/18/2014   Nerve pain 08/18/2014   Arthritis, degenerative 08/18/2014   Compulsive tobacco user syndrome 08/18/2014   Lumbar canal stenosis 07/07/2014   Neuritis or radiculitis due to rupture of lumbar intervertebral disc 05/14/2014   DDD (degenerative disc disease), lumbar 05/03/2014   Low back strain 05/03/2014   Abnormal prostate specific antigen 01/25/2014   Pancreatitis 01/17/2012    Social History   Tobacco Use   Smoking status: Former    Current packs/day: 0.00    Average packs/day: 0.5 packs/day for 55.0 years (27.5 ttl pk-yrs)    Types: Cigarettes    Start date: 12/18/1966    Quit date: 12/17/2021    Years since quitting: 1.6   Smokeless tobacco: Never  Substance Use Topics   Alcohol use: No    Alcohol/week: 0.0 standard drinks of alcohol    No Known Allergies  Current Meds  Medication Sig   acetaminophen  (TYLENOL ) 500 MG tablet Take 1,000 mg by mouth every 6 (six) hours as needed for moderate pain or headache.   albuterol  (VENTOLIN  HFA) 108 (90 Base) MCG/ACT inhaler Inhale 2 puffs into the lungs every 6 (six) hours as needed for wheezing or shortness of breath.   amiodarone  (PACERONE ) 200 MG tablet Take by mouth.   amLODipine (NORVASC) 10 MG tablet Take 10 mg by mouth daily.   celecoxib (CELEBREX) 100 MG capsule Take 100 mg by mouth 2 (two) times daily.   cyclobenzaprine  (FLEXERIL ) 10 MG tablet Take 10 mg by mouth 3 (three) times daily as needed for muscle spasms.   DULoxetine (CYMBALTA) 60 MG capsule Take 60 mg by mouth daily.   fexofenadine  (ALLEGRA) 180 MG tablet Take 180 mg by mouth daily as needed for allergies or rhinitis.   fluticasone  (FLONASE ) 50 MCG/ACT nasal spray Place 2 sprays into both nostrils daily.   ibuprofen (ADVIL) 200 MG tablet Take 400 mg by mouth every 6 (six) hours as needed for headache or moderate pain.   Oxycodone  HCl 10 MG TABS Take 1 tablet (10 mg total) by mouth every 6 (six) hours as needed for severe pain.   pantoprazole  (PROTONIX ) 40 MG tablet Take 40 mg by mouth every evening.    Immunization History  Administered Date(s) Administered   Influenza, Mdck, Trivalent,PF 6+ MOS(egg free) 01/03/2023   PFIZER(Purple Top)SARS-COV-2 Vaccination 05/09/2019, 06/02/2019        Objective:     BP 122/70 (BP Location: Right Arm, Cuff Size: Large)   Pulse 72   Temp (!) 97.1 F (36.2 C)   Ht 6' (1.829 m)   Wt 203 lb (92.1 kg)   SpO2 97%   BMI 27.53 kg/m   SpO2: 97 % O2 Device: None (Room air)  GENERAL: Well-developed, overweight gentleman, no acute distress.  Mild conversational dyspnea.  Fully ambulatory.  Nasal quality to speech. HEAD: Normocephalic,  atraumatic.  EYES: Pupils equal, round, reactive to light.  No scleral icterus.  NOSE: Significant turbinate edema.  Noted airway. MOUTH: Dentition intact, oral mucosa moist.  Mallampati III-IV airway. NECK: Supple. No thyromegaly. Trachea midline. No JVD.  No adenopathy. PULMONARY: Good air entry bilaterally.  Coarse, otherwise, no adventitious sounds. CARDIOVASCULAR: S1 and S2. Regular rate and rhythm.  No rubs, murmurs or gallops heard. ABDOMEN: Mildly protuberant, otherwise benign. MUSCULOSKELETAL: No joint deformity, no clubbing, no edema.  NEUROLOGIC: No overt focal deficit, no gait disturbance, speech is fluent. SKIN: Intact,warm,dry. PSYCH: Mildly depressed mood, normal behavio      Assessment & Plan:     ICD-10-CM   1. Stage 1 mild COPD by GOLD classification (HCC)  J44.9     2. Shortness of breath  R06.02     3.  Adenocarcinoma, lung, right (HCC)  C34.91     4. DISH (diffuse idiopathic skeletal hyperostosis)  M48.10     5. At risk for sleep apnea  Z91.89 Polysomnography 4 or more parameters    6. 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  Significant symptom burden.    Standing Status:   Future    Expected Date:   09/10/2023    Expiration Date:   08/26/2024    Where should this test be performed::   [facility]   Sleep apnea Chronic sleep apnea with persistent symptoms of hypersomnia and daytime grogginess. Previous home sleep studies were inconclusive due to equipment dislodgement, with a 20% chance of inaccurate detection. Symptoms may contribute to daytime dyspnea and fatigue. An in-lab sleep study is recommended for accurate diagnosis and management, allowing for continuous monitoring and equipment adjustment if dislodged. - Order in-lab sleep study - Consider referral to sleep specialist based on sleep study results  Shortness of breath Intermittent dyspnea, possibly related to sleep apnea (fatigue) and nasal congestion (upper airway resistance syndrome). Pulmonary function tests show a 12% improvement compared to previous results. Currently using albuterol  as needed before exercise, with no significant difference noted when omitted. - Continue albuterol  as needed before exercise - Patient declines maintenance inhalers due to lack of efficacy  Nasal congestion Chronic nasal congestion with sinus issues. Previous use of Flonase  for three weeks  showed no significant improvement. Crowded airway observed on examination. Referral to ENT for further evaluation and management is considered. - Refer to ENT for evaluation of nasal congestion   Advised if symptoms do not improve or worsen, to please contact office for sooner follow up or seek emergency care.    I spent 34 minutes of dedicated to the care of this patient on the date of this encounter to include pre-visit review of records, face-to-face time with the patient discussing conditions above, post visit ordering of testing, clinical documentation with the electronic health record, making appropriate referrals as documented, and communicating necessary findings to members of the patients care team.     C. Chloe Counter, MD Advanced Bronchoscopy  [facility]    *This note was generated using voice recognition software/Dragon and/or AI transcription program.  Despite best efforts to proofread, errors can occur which can change the meaning. Any transcriptional errors that result from this process are unintentional and may not be fully corrected at the time of dictation.

## 2023-08-27 NOTE — Patient Instructions (Signed)
 VISIT SUMMARY:  Today, we discussed your ongoing sleep disturbances and daytime fatigue. We reviewed your history of sleep apnea and your difficulties with CPAP therapy. We also talked about your shortness of breath and nasal congestion.  YOUR PLAN:  -SLEEP APNEA: Sleep apnea is a condition where your breathing repeatedly stops and starts during sleep, leading to poor sleep quality and daytime fatigue. We recommend an in-lab sleep study to get a more accurate diagnosis and to help manage your symptoms better. Based on the results, we may refer you to a sleep specialist.  You have great symptom burden and your home study was noncontributory so we will need to resort to an in lab study.  -SHORTNESS OF BREATH: Your shortness of breath may be related to your sleep apnea and nasal congestion. You should continue using your albuterol  inhaler as needed before exercise.  -NASAL CONGESTION: Chronic nasal congestion can make it hard to breathe through your nose and may contribute to your sleep issues. We will refer you to an ear, nose, and throat (ENT) specialist for further evaluation and management.  INSTRUCTIONS:  Please schedule an in-lab sleep study as soon as possible. Continue using your albuterol  inhaler before exercise. We will also refer you to an ENT specialist for your nasal congestion. Follow up with us  after your sleep study and ENT evaluation to discuss the next steps.

## 2023-08-30 LAB — PULMONARY FUNCTION TEST
DL/VA % pred: 82 %
DL/VA: 3.29 ml/min/mmHg/L
DLCO unc % pred: 67 %
DLCO unc: 18.35 ml/min/mmHg
FEF 25-75 Post: 2.04 L/s
FEF 25-75 Pre: 1.8 L/s
FEF2575-%Change-Post: 12 %
FEF2575-%Pred-Post: 76 %
FEF2575-%Pred-Pre: 67 %
FEV1-%Change-Post: 2 %
FEV1-%Pred-Post: 74 %
FEV1-%Pred-Pre: 72 %
FEV1-Post: 2.63 L
FEV1-Pre: 2.56 L
FEV1FVC-%Change-Post: 2 %
FEV1FVC-%Pred-Pre: 93 %
FEV6-%Change-Post: 0 %
FEV6-%Pred-Post: 82 %
FEV6-%Pred-Pre: 82 %
FEV6-Post: 3.71 L
FEV6-Pre: 3.71 L
FEV6FVC-%Pred-Post: 105 %
FEV6FVC-%Pred-Pre: 105 %
FVC-%Change-Post: 0 %
FVC-%Pred-Post: 77 %
FVC-%Pred-Pre: 77 %
FVC-Post: 3.71 L
FVC-Pre: 3.71 L
Post FEV1/FVC ratio: 71 %
Post FEV6/FVC ratio: 100 %
Pre FEV1/FVC ratio: 69 %
Pre FEV6/FVC Ratio: 100 %
RV % pred: 83 %
RV: 2.14 L
TLC % pred: 81 %
TLC: 6.09 L

## 2023-09-17 ENCOUNTER — Ambulatory Visit: Attending: Sleep Medicine

## 2023-09-17 DIAGNOSIS — Z6827 Body mass index (BMI) 27.0-27.9, adult: Secondary | ICD-10-CM | POA: Diagnosis not present

## 2023-09-17 DIAGNOSIS — E669 Obesity, unspecified: Secondary | ICD-10-CM | POA: Diagnosis not present

## 2023-09-17 DIAGNOSIS — G4733 Obstructive sleep apnea (adult) (pediatric): Secondary | ICD-10-CM | POA: Insufficient documentation

## 2023-09-17 DIAGNOSIS — I10 Essential (primary) hypertension: Secondary | ICD-10-CM | POA: Diagnosis not present

## 2023-09-17 DIAGNOSIS — J449 Chronic obstructive pulmonary disease, unspecified: Secondary | ICD-10-CM | POA: Insufficient documentation

## 2023-09-17 DIAGNOSIS — Z9189 Other specified personal risk factors, not elsewhere classified: Secondary | ICD-10-CM

## 2023-10-03 ENCOUNTER — Ambulatory Visit: Payer: Self-pay | Admitting: Pulmonary Disease

## 2023-10-03 DIAGNOSIS — G2581 Restless legs syndrome: Secondary | ICD-10-CM

## 2023-10-03 NOTE — Telephone Encounter (Signed)
-----   Message from Dedra Sanders sent at 10/03/2023  1:13 PM EDT ----- He does not have significant sleep apnea.  He does have restless legs which may be causing his excessive daytime fatigue.  If he wishes we could refer him to our sleep specialist for discussion of  this issue. ----- Message ----- From: Vannie Donzell RAMAN Sent: 09/26/2023   5:26 PM EDT To: Dedra LITTIE Sanders, MD

## 2023-10-03 NOTE — Telephone Encounter (Signed)
 Copied from CRM [id number]. Topic: General - Other >> Oct 03, 2023 11:11 AM Benton O wrote: Reason for CRM: patient of dr tamea she ask me to do a sleep study and to follow up with a nose specialist he suggested that i follow up with neurologist dont know if i need to follow up with neurologist or come back and see her please reach out to patient with more information  [phone]

## 2023-10-03 NOTE — Telephone Encounter (Signed)
 Patient advised of sleep study.  Patient reports he did she Dr. Juengal at ENT. And was advised to see Dr. Maree at [facility].  Patient agreed to referral.

## 2023-10-07 DIAGNOSIS — R0683 Snoring: Secondary | ICD-10-CM

## 2023-12-02 ENCOUNTER — Telehealth (INDEPENDENT_AMBULATORY_CARE_PROVIDER_SITE_OTHER): Payer: Self-pay | Admitting: Sleep Medicine

## 2023-12-02 NOTE — Telephone Encounter (Signed)
 Sleep study negative for sleep apnea, results forwarded to ordering provider.

## 2023-12-09 ENCOUNTER — Ambulatory Visit
Admission: RE | Admit: 2023-12-09 | Discharge: 2023-12-09 | Disposition: A | Discharge status: Home / Self Care | Source: Ambulatory Visit | Attending: Nurse Practitioner | Admitting: Nurse Practitioner

## 2023-12-09 DIAGNOSIS — Z85118 Personal history of other malignant neoplasm of bronchus and lung: Secondary | ICD-10-CM | POA: Diagnosis present

## 2023-12-09 DIAGNOSIS — Z08 Encounter for follow-up examination after completed treatment for malignant neoplasm: Secondary | ICD-10-CM | POA: Diagnosis present

## 2023-12-09 MED ORDER — IOHEXOL 300 MG/ML  SOLN
75.0000 mL | Freq: Once | INTRAMUSCULAR | Status: AC | PRN
  Administered 2023-12-09: 75 mL via INTRAVENOUS

## 2023-12-17 ENCOUNTER — Ambulatory Visit: Payer: Self-pay | Admitting: Nurse Practitioner

## 2023-12-20 ENCOUNTER — Ambulatory Visit: Admitting: Internal Medicine

## 2024-01-02 ENCOUNTER — Encounter: Payer: Self-pay | Admitting: Nurse Practitioner

## 2024-01-02 ENCOUNTER — Inpatient Hospital Stay: Admitting: Nurse Practitioner

## 2024-01-02 VITALS — BP 186/84 | HR 72 | Temp 98.6°F | Resp 19 | Wt 210.4 lb

## 2024-01-02 DIAGNOSIS — Z08 Encounter for follow-up examination after completed treatment for malignant neoplasm: Secondary | ICD-10-CM

## 2024-01-02 DIAGNOSIS — Z85118 Personal history of other malignant neoplasm of bronchus and lung: Secondary | ICD-10-CM

## 2024-01-02 NOTE — Progress Notes (Signed)
Patient not seen. Will reschedule 

## 2024-01-03 ENCOUNTER — Inpatient Hospital Stay: Attending: Nurse Practitioner | Admitting: Nurse Practitioner

## 2024-01-03 ENCOUNTER — Encounter: Payer: Self-pay | Admitting: Nurse Practitioner

## 2024-01-03 VITALS — BP 170/90 | HR 81 | Temp 95.9°F | Resp 18 | Ht 72.0 in | Wt 212.5 lb

## 2024-01-03 DIAGNOSIS — K760 Fatty (change of) liver, not elsewhere classified: Secondary | ICD-10-CM | POA: Insufficient documentation

## 2024-01-03 DIAGNOSIS — Z8042 Family history of malignant neoplasm of prostate: Secondary | ICD-10-CM | POA: Diagnosis not present

## 2024-01-03 DIAGNOSIS — Z08 Encounter for follow-up examination after completed treatment for malignant neoplasm: Secondary | ICD-10-CM

## 2024-01-03 DIAGNOSIS — Z85118 Personal history of other malignant neoplasm of bronchus and lung: Secondary | ICD-10-CM | POA: Diagnosis present

## 2024-01-03 DIAGNOSIS — Z87891 Personal history of nicotine dependence: Secondary | ICD-10-CM | POA: Diagnosis not present

## 2024-01-03 DIAGNOSIS — I10 Essential (primary) hypertension: Secondary | ICD-10-CM | POA: Diagnosis not present

## 2024-01-03 DIAGNOSIS — K209 Esophagitis, unspecified without bleeding: Secondary | ICD-10-CM | POA: Diagnosis not present

## 2024-01-03 DIAGNOSIS — Z902 Acquired absence of lung [part of]: Secondary | ICD-10-CM | POA: Diagnosis not present

## 2024-01-03 NOTE — Progress Notes (Signed)
 Joseph Larson is a  68 y.o. male who presents for  CHIEF COMPLAINT Chief Complaint  Patient presents with  . Follow-up  . Hypertension  . Hyperlipidemia  . COPD  . prediabetes    Subjective: History of Present Illness Pt in NAD. HTN stable on meds. Has HLD not on statin, prediabetes not on meds and GERD on PPI. Also with COPD(former smoker). Weight stable. Still with daytime somnolence. Still with SOB. Sleeping well at night. No fever or HA's. Denies CP or palpitations. No change in bowels or bladder.    Past Medical History:  Diagnosis Date  . Acute pancreatitis (HHS-HCC)    THOUGHT TO BE BILIARY PANCREATITIS  . 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 Alcohol use    DISTANT PAST  . Allergy    seasonal  . Angina pectoris   . Anxiety 2004  . 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 Cervicalgia   . Chronic kidney disease 2012   just a stone  . Chronic rhinitis   . COPD (chronic obstructive pulmonary disease) (CMS/HHS-HCC)   . Diastasis recti   . ED (erectile dysfunction)   . Esophageal spasm   . Gastrointestinal ulcer 2008  . GERD (gastroesophageal reflux disease) 2000  . Headache, migraine   . Hemorrhoids   . Hernia   . Hyperlipidemia   . Hypertension   . Lumbago   . Neuralgia    NEURITIS  . OA (osteoarthritis)   . Pancreatitis (HHS-HCC) 01/17/2012  . Pulmonary nodule 11/17/2021  . Tobacco dependence    Patient Active Problem List  Diagnosis  . GERD (gastroesophageal reflux disease)  . ED (erectile dysfunction)  . Diastasis recti  . Tobacco dependence  . Esophageal spasm  . OA (osteoarthritis)  . Anxiety  . Chronic rhinitis  . Acute pancreatitis (HHS-HCC)  . Alcohol use  . Hemorrhoids  . Hernia  . Headache, migraine  . Lumbago  . Hyperlipidemia  . Neuralgia  . Hypertension  . Cervicalgia  . Elevated PSA  . DDD (degenerative disc disease), lumbar  . Lumbar spine  strain, initial encounter  . Lumbar radiculitis  . Spinal stenosis, lumbar region, without neurogenic claudication  . Coronary artery disease involving native coronary artery of native heart  . Atherosclerosis of abdominal aorta ()  . Pain in joint of left shoulder  . Abdominal hernia  . BPH with obstruction/lower urinary tract symptoms  . Calcific tendinitis of left shoulder  . Neuritis or radiculitis due to rupture of lumbar intervertebral disc  . Osteoarthritis of left glenohumeral joint  . Right flank pain  . Paroxysmal A-fib (CMS/HHS-HCC)  . Prediabetes  . Panlobular emphysema (CMS/HHS-HCC)    Past Surgical History:  Procedure Laterality Date  . FRACTURE SURGERY  1994   left foot  . ARTHROSCOPY SHOULDER Right 1998  . CHOLECYSTECTOMY  2007   was done due to biliary pancreatitis  . APPENDECTOMY  2007   not sure if removed with gall bladder  . COLONOSCOPY  03/14/2006   Dr. FABIENE Holmes @ [facility] - Hyperplastic Polyp: CBF 02/2006  . CARDIAC CATHETERIZATION 2010  2010   MINIMAL LAD FINDINGS  . EGD  11/15/2011  . Upper EUS  01/17/2012   Normal Esophagus, Stomach & Duodenum (Dr. Toribio Cedar)  . COLONOSCOPY  07/01/2015   Dr. FABIENE Holmes @ [facility] - Adenomatous Polyp: CBF 06/2020  . EGD  07/01/2015   No repeat per RTE  . HOLEP-LASER ENUCLEATION OF THE  PROSTATE WITH MORCELLATION  06/26/2019   Surgeon: Francisca Redell BROCKS, MD; Location: [facility] ORS; Service: Urology; Laterality: N/A;  . COLONOSCOPY  12/09/2020   Diverticulosis/Otherwise normal colon/PHx CP/Repeat 69yrs/TKT  . XI ROBOTIC ASSISTED THORACOSCOPY-RIGHT UPPER LOBE WEDGE RESECTION Right 11/17/2021   Surgeon: Kerrin Elspeth BROCKS, MD; Location: [facility] OR; Service: Thoracic; Laterality: Right;  . LYMPH NODE DISSECTION N/A Right 11/17/2021   Surgeon: Kerrin Elspeth BROCKS, MD; Location: [facility] OR; Service: Thoracic; Laterality: Right;  . EXCISION MORTON'S NEUROMA  Left    left foot  . INGUINAL HERNIA REPAIR Right   . LEFT FOOT NEUROMA     . RIGHT ROTATOR CUFF SURGERY       Current Outpatient Medications:  .  acetaminophen  (TYLENOL ) 500 MG tablet, Take 1,000 mg by mouth every 6 (six) hours as needed   , Disp: , Rfl:  .  amLODIPine (NORVASC) 10 MG tablet, Take 1 tablet (10 mg total) by mouth once daily, Disp: 30 tablet, Rfl: 11 .  amoxicillin  (AMOXIL ) 500 MG capsule, TAKE 1 CAPSULE THREE TIMES DAILY UNTIL GONE, Disp: , Rfl:  .  aspirin 81 MG EC tablet, Take 81 mg by mouth once daily, Disp: , Rfl:  .  celecoxib (CELEBREX) 200 MG capsule, Take 1 capsule (200 mg total) by mouth 2 (two) times daily as needed for Pain for up to 90 days, Disp: 180 capsule, Rfl: 0 .  cyclobenzaprine  (FLEXERIL ) 10 MG tablet, Take 1 tablet (10 mg total) by mouth 3 (three) times daily as needed, Disp: 30 tablet, Rfl: 2 .  fluticasone  propionate (FLONASE ) 50 mcg/actuation nasal spray, Place 2 sprays into both nostrils once daily, Disp: 16 g, Rfl: 2 .  gabapentin (NEURONTIN) 300 MG capsule, Take 300 mg at night for a week then increase to 600 mg at night and continue that dose., Disp: 60 capsule, Rfl: 1 .  HYDROcodone -acetaminophen  (NORCO) 5-325 mg tablet, Take 1 tablet by mouth every 6 (six) hours as needed for Pain, Disp: 60 tablet, Rfl: 0 .  modafiniL (PROVIGIL) 100 MG tablet, Take 100 mg daily for daytime drowsiness, fatigue and prolonged sleep., Disp: 30 tablet, Rfl: 2 .  pantoprazole  (PROTONIX ) 40 MG DR tablet, TAKE 1 TABLET TWICE A DAY, Disp: 180 tablet, Rfl: 3 .  levocetirizine (XYZAL) 5 MG tablet, Take 1 tablet (5 mg total) by mouth every evening (Patient not taking: Reported on 01/03/2024), Disp: 30 tablet, Rfl: 2 .  VENTOLIN  HFA 90 mcg/actuation inhaler, INHALE 2 INHALATIONS INTO THE LUNGS EVERY 6 HOURS AS NEEDED FOR WHEEZING, Disp: 18 g, Rfl: 0  Patient has no known allergies.  Social History   Socioeconomic History  . Marital status: Married  Tobacco Use  . Smoking status: Former    Current packs/day: 0.00    Average packs/day: 0.5  packs/day for 37.0 years (18.5 ttl pk-yrs)    Types: Cigarettes    Start date: 11/16/1984    Quit date: 11/16/2021    Years since quitting: 2.1  . Smokeless tobacco: Never  . Tobacco comments:    Smokes less than a pack per day  Vaping Use  . Vaping status: Never Used  Substance and Sexual Activity  . Alcohol use: Yes  . Drug use: Not Currently  . Sexual activity: Yes    Partners: Female   Social Drivers of Corporate investment banker Strain: Low Risk  (11/27/2023)   Overall Financial Resource Strain (CARDIA)   . Difficulty of Paying Living Expenses: Not hard at all  Food Insecurity: No  Food Insecurity (11/27/2023)   Hunger Vital Sign   . Worried About Programme researcher, broadcasting/film/video in the Last Year: Never true   . Ran Out of Food in the Last Year: Never true  Transportation Needs: No Transportation Needs (11/27/2023)   PRAPARE - Transportation   . Lack of Transportation (Medical): No   . Lack of Transportation (Non-Medical): No  Housing Stability: Low Risk  (11/27/2023)   Housing Stability Vital Sign   . Unable to Pay for Housing in the Last Year: No   . Number of Times Moved in the Last Year: 0   . Homeless in the Last Year: No    Family History  Problem Relation Name Age of Onset  . Diabetes type II Mother    . Hip fracture Mother    . Liver cancer Father    . Alcohol abuse Father    . Prostate cancer Brother    . Prostate cancer Brother    . Prostate cancer Brother      A comprehensive ROS was negative except for HPI  PE: BP 122/78   Pulse 72   Ht 182.9 cm (6')   Wt 96.6 kg (213 lb)   SpO2 97%   BMI 28.89 kg/m  General. Alert oriented x3  Eyes. Sclera and conjunctiva clear; pupils equal round and reactive to light and accommodation; extraocular movements intact Nose. Mucosa healthy without drainage or ulceration Oropharynx. No suspicious lesions Neck. No swelling, masses, stiffness, pain, limited movement, carotid pulses normal bilaterally, thyroid  normal size, no masses  palpated.  No bruits Lungs. Respirations unlabored; clear to auscultation bilaterally Back. No spinal deformity Cardiovascular. Heart regular rate and rhythm without murmurs, gallops, or rubs Abdomen. Soft; non tender; non distended; normoactive bowel sounds; no masses or organomegaly Lymph Nodes. No significant cervical, supraclavicular, axillary or inguinal lymphadenopathy noted Musculoskeletal. No deformities; no active joint inflammation Extremities. Normal, no edema Pulses. Dorsalis pedis palpable and symmetric bilaterally Neurologic. Alert and oriented; speech intact; face symmetrical; moves all extremities well  Initial consult on 11/27/2023  Component Date Value Ref Range Status  . AChR Binding Abs, Serum - [facility] 11/27/2023 <0.07  0.00 - 0.24 nmol/L Final  . AChR Blocking Abs, Serum - [facility] 11/27/2023 16  0 - 25 % Final  . AChR Modulating Ab - [facility] 11/27/2023 0  0 - 45 % Final  . Anti-striation Abs - [facility] 11/27/2023 Negative  Neg:<1:100 Final  . Reflex Information - [facility] 11/27/2023 Comment   Final  . C Reactive Protein - [facility] 11/27/2023 4  0 - 10 mg/L Final  . CK, Total (Creatine Kinase, Total) 11/27/2023 94  49 - 397 U/L Final  . Sed Rate - [facility] 11/27/2023 9  0 - 30 mm/hr Final  . MuSK Antibody - [facility] 11/27/2023 <1.0  U/mL Final  Office Visit on 11/07/2023  Component Date Value Ref Range Status  . Vent Rate (bpm) 11/07/2023 72   Final  . PR Interval (msec) 11/07/2023 132   Final  . QRS Interval (msec) 11/07/2023 84   Final  . QT Interval (msec) 11/07/2023 404   Final  . QTc (msec) 11/07/2023 442   Final  Appointment on 10/01/2023  Component Date Value Ref Range Status  . Glucose 10/01/2023 152 (H)  70 - 110 mg/dL Final  . Sodium 92/91/7974 139  136 - 145 mmol/L Final  . Potassium 10/01/2023 4.8  3.6 - 5.1 mmol/L Final  . Chloride 10/01/2023 104  97 - 109 mmol/L Final  .  Carbon Dioxide (CO2) 10/01/2023 29.2  22.0 - 32.0 mmol/L Final  . Calcium  10/01/2023 9.0  8.7 - 10.3 mg/dL Final  . Urea Nitrogen (BUN) 10/01/2023 25  7 - 25 mg/dL Final  . Creatinine 92/91/7974 1.5 (H)  0.7 - 1.3 mg/dL Final  . Glomerular Filtration Rate (eGFR) 10/01/2023 50 (L)  >60 mL/min/1.73sq m Final  . BUN/Crea Ratio 10/01/2023 16.7  6.0 - 20.0 Final  . Anion Gap w/K 10/01/2023 10.6  6.0 - 16.0 Final  Appointment on 09/30/2023  Component Date Value Ref Range Status  . Glucose 09/30/2023 143 (H)  70 - 110 mg/dL Final  . Sodium 92/92/7974 140  136 - 145 mmol/L Final  . Potassium 09/30/2023 5.8 (H)  3.6 - 5.1 mmol/L Final  . Chloride 09/30/2023 104  97 - 109 mmol/L Final  . Carbon Dioxide (CO2) 09/30/2023 29.3  22.0 - 32.0 mmol/L Final  . Urea Nitrogen (BUN) 09/30/2023 16  7 - 25 mg/dL Final  . Creatinine 92/92/7974 1.2  0.7 - 1.3 mg/dL Final  . Glomerular Filtration Rate (eGFR) 09/30/2023 65  >60 mL/min/1.73sq m Final  . Calcium 09/30/2023 9.3  8.7 - 10.3 mg/dL Final  . Albumin 92/92/7974 4.3  3.5 - 4.8 g/dL Final  . Phosphorus 92/92/7974 3.6  2.5 - 5.0 mg/dL Final  Office Visit on 08/30/2023  Component Date Value Ref Range Status  . Cholesterol, Total 08/30/2023 206 (H)  100 - 200 mg/dL Final  . Triglyceride 93/93/7974 140  35 - 199 mg/dL Final  . HDL (High Density Lipoprotein) Cho* 08/30/2023 47.2  29.0 - 71.0 mg/dL Final  . LDL Calculated 08/30/2023 868 (H)  0 - 130 mg/dL Final  . VLDL Cholesterol 08/30/2023 28  mg/dL Final  . Cholesterol/HDL Ratio 08/30/2023 4.4   Final  . WBC (White Blood Cell Count) 08/30/2023 6.1  4.1 - 10.2 10^3/uL Final  . RBC (Red Blood Cell Count) 08/30/2023 4.70  4.69 - 6.13 10^6/uL Final  . Hemoglobin 08/30/2023 13.8 (L)  14.1 - 18.1 gm/dL Final  . Hematocrit 93/93/7974 42.1  40.0 - 52.0 % Final  . MCV (Mean Corpuscular Volume) 08/30/2023 89.6  80.0 - 100.0 fl Final  . MCH (Mean Corpuscular Hemoglobin) 08/30/2023 29.4  27.0 - 31.2 pg Final  . MCHC (Mean Corpuscular Hemoglobin * 08/30/2023 32.8  32.0 - 36.0 gm/dL Final  .  Platelet Count 08/30/2023 244  150 - 450 10^3/uL Final  . RDW-CV (Red Cell Distribution Widt* 08/30/2023 11.8  11.6 - 14.8 % Final  . MPV (Mean Platelet Volume) 08/30/2023 9.8  9.4 - 12.4 fl Final  . Neutrophils 08/30/2023 3.14  1.50 - 7.80 10^3/uL Final  . Lymphocytes 08/30/2023 1.92  1.00 - 3.60 10^3/uL Final  . Monocytes 08/30/2023 0.57  0.00 - 1.50 10^3/uL Final  . Eosinophils 08/30/2023 0.32  0.00 - 0.55 10^3/uL Final  . Basophils 08/30/2023 0.11 (H)  0.00 - 0.09 10^3/uL Final  . Neutrophil % 08/30/2023 51.6  32.0 - 70.0 % Final  . Lymphocyte % 08/30/2023 31.6  10.0 - 50.0 % Final  . Monocyte % 08/30/2023 9.4  4.0 - 13.0 % Final  . Eosinophil % 08/30/2023 5.3 (H)  1.0 - 5.0 % Final  . Basophil% 08/30/2023 1.8  0.0 - 2.0 % Final  . Immature Granulocyte % 08/30/2023 0.3  <=0.7 % Final  . Immature Granulocyte Count 08/30/2023 0.02  <=0.06 10^3/L Final  . Glucose 08/30/2023 137 (H)  70 - 110 mg/dL Final  . Sodium 93/93/7974 139  136 - 145 mmol/L Final  . Potassium 08/30/2023 4.4  3.6 - 5.1 mmol/L Final  . Chloride 08/30/2023 103  97 - 109 mmol/L Final  . Carbon Dioxide (CO2) 08/30/2023 30.2  22.0 - 32.0 mmol/L Final  . Urea Nitrogen (BUN) 08/30/2023 19  7 - 25 mg/dL Final  . Creatinine 93/93/7974 1.3  0.7 - 1.3 mg/dL Final  . Glomerular Filtration Rate (eGFR) 08/30/2023 59 (L)  >60 mL/min/1.73sq m Final  . Calcium 08/30/2023 8.9  8.7 - 10.3 mg/dL Final  . AST  93/93/7974 21  8 - 39 U/L Final  . ALT  08/30/2023 25  6 - 57 U/L Final  . Alk Phos (alkaline Phosphatase) 08/30/2023 115 (H)  34 - 104 U/L Final  . Albumin 08/30/2023 4.3  3.5 - 4.8 g/dL Final  . Bilirubin, Total 08/30/2023 0.7  0.3 - 1.2 mg/dL Final  . Protein, Total 08/30/2023 6.8  6.1 - 7.9 g/dL Final  . A/G Ratio 93/93/7974 1.7  1.0 - 5.0 gm/dL Final  . Thyroid  Stimulating Hormone (TSH) 08/30/2023 2.611  0.450-5.330 uIU/ml uIU/mL Final  . PSA (Prostate Specific Antigen), T* 08/30/2023 3.43  0.10 - 4.00 ng/mL Final  .  Color 08/30/2023 Yellow  Colorless, Straw, Light Yellow, Yellow, Dark Yellow Final  . Clarity 08/30/2023 Clear  Clear Final  . Specific Gravity 08/30/2023 1.026  1.005 - 1.030 Final  . pH, Urine 08/30/2023 6.0  5.0 - 8.0 Final  . Protein, Urinalysis 08/30/2023 1+ (!)  Negative mg/dL Final  . Glucose, Urinalysis 08/30/2023 Negative  Negative mg/dL Final  . Ketones, Urinalysis 08/30/2023 Negative  Negative mg/dL Final  . Blood, Urinalysis 08/30/2023 Negative  Negative Final  . Nitrite, Urinalysis 08/30/2023 Negative  Negative Final  . Leukocyte Esterase, Urinalysis 08/30/2023 Negative  Negative Final  . Hyaline Casts, Urinalysis 08/30/2023 22  /lpf Final  . Bilirubin, Urinalysis 08/30/2023 Negative  Negative Final  . Urobilinogen, Urinalysis 08/30/2023 2.0 (H)  0.2 - 1.0 mg/dL Final  . Mucous, Urine 08/30/2023 PRESENT (!)  None Seen Final  . WBC, UA 08/30/2023 3  <=5 /hpf Final  . Red Blood Cells, Urinalysis 08/30/2023 2  <=3 /hpf Final  . Bacteria, Urinalysis 08/30/2023 0-5  0 - 5 /hpf Final  . Squamous Epithelial Cells, Urinaly* 08/30/2023 0  /hpf Final  . Hemoglobin A1C 08/30/2023 7.0 (H)  4.2 - 5.6 % Final  . Average Blood Glucose (Calc) 08/30/2023 154  mg/dL Final  Appointment on 96/75/7974  Component Date Value Ref Range Status  . Glucose 06/17/2023 130 (H)  70 - 110 mg/dL Final  . Sodium 96/75/7974 139  136 - 145 mmol/L Final  . Potassium 06/17/2023 4.8  3.6 - 5.1 mmol/L Final  . Chloride 06/17/2023 103  97 - 109 mmol/L Final  . Carbon Dioxide (CO2) 06/17/2023 29.2  22.0 - 32.0 mmol/L Final  . Urea Nitrogen (BUN) 06/17/2023 19  7 - 25 mg/dL Final  . Creatinine 96/75/7974 1.2  0.7 - 1.3 mg/dL Final  . Glomerular Filtration Rate (eGFR) 06/17/2023 65  >60 mL/min/1.73sq m Final  . Calcium 06/17/2023 8.9  8.7 - 10.3 mg/dL Final  . Albumin 96/75/7974 4.4  3.5 - 4.8 g/dL Final  . Phosphorus 96/75/7974 3.3  2.5 - 5.0 mg/dL Final  Ancillary Orders on 05/30/2023  Component Date Value Ref  Range Status  . Glucose 05/30/2023 150 (H)  70 - 110 mg/dL Final  . Sodium 96/93/7974 138  136 - 145 mmol/L Final  . Potassium 05/30/2023 4.7  3.6 - 5.1 mmol/L Final  . Chloride 05/30/2023 102  97 - 109 mmol/L Final  . Carbon Dioxide (CO2) 05/30/2023 28.8  22.0 - 32.0 mmol/L Final  . Urea Nitrogen (BUN) 05/30/2023 24  7 - 25 mg/dL Final  . Creatinine 96/93/7974 1.4 (H)  0.7 - 1.3 mg/dL Final  . Glomerular Filtration Rate (eGFR) 05/30/2023 54 (L)  >60 mL/min/1.73sq m Final  . Calcium 05/30/2023 9.0  8.7 - 10.3 mg/dL Final  . Albumin 96/93/7974 4.4  3.5 - 4.8 g/dL Final  . Phosphorus 96/93/7974 3.6  2.5 - 5.0 mg/dL Final  . PSA (Prostate Specific Antigen), T* 05/30/2023 3.90  0.10 - 4.00 ng/mL Final  Office Visit on 05/01/2023  Component Date Value Ref Range Status  . Cholesterol, Total 05/01/2023 202 (H)  100 - 200 mg/dL Final  . Triglyceride 97/94/7974 182  35 - 199 mg/dL Final  . HDL (High Density Lipoprotein) Cho* 05/01/2023 48.5  29.0 - 71.0 mg/dL Final  . LDL Calculated 05/01/2023 882  0 - 130 mg/dL Final  . VLDL Cholesterol 05/01/2023 36  mg/dL Final  . Cholesterol/HDL Ratio 05/01/2023 4.2   Final  . WBC (White Blood Cell Count) 05/01/2023 8.8  4.1 - 10.2 10^3/uL Final  . RBC (Red Blood Cell Count) 05/01/2023 4.47 (L)  4.69 - 6.13 10^6/uL Final  . Hemoglobin 05/01/2023 13.5 (L)  14.1 - 18.1 gm/dL Final  . Hematocrit 97/94/7974 40.7  40.0 - 52.0 % Final  . MCV (Mean Corpuscular Volume) 05/01/2023 91.1  80.0 - 100.0 fl Final  . MCH (Mean Corpuscular Hemoglobin) 05/01/2023 30.2  27.0 - 31.2 pg Final  . MCHC (Mean Corpuscular Hemoglobin * 05/01/2023 33.2  32.0 - 36.0 gm/dL Final  . Platelet Count 05/01/2023 240  150 - 450 10^3/uL Final  . RDW-CV (Red Cell Distribution Widt* 05/01/2023 12.3  11.6 - 14.8 % Final  . MPV (Mean Platelet Volume) 05/01/2023 9.8  9.4 - 12.4 fl Final  . Neutrophils 05/01/2023 5.99  1.50 - 7.80 10^3/uL Final  . Lymphocytes 05/01/2023 1.61  1.00 - 3.60 10^3/uL  Final  . Monocytes 05/01/2023 0.77  0.00 - 1.50 10^3/uL Final  . Eosinophils 05/01/2023 0.23  0.00 - 0.55 10^3/uL Final  . Basophils 05/01/2023 0.14 (H)  0.00 - 0.09 10^3/uL Final  . Neutrophil % 05/01/2023 68.2  32.0 - 70.0 % Final  . Lymphocyte % 05/01/2023 18.3  10.0 - 50.0 % Final  . Monocyte % 05/01/2023 8.8  4.0 - 13.0 % Final  . Eosinophil % 05/01/2023 2.6  1.0 - 5.0 % Final  . Basophil% 05/01/2023 1.6  0.0 - 2.0 % Final  . Immature Granulocyte % 05/01/2023 0.5  <=0.7 % Final  . Immature Granulocyte Count 05/01/2023 0.04  <=0.06 10^3/L Final  . Glucose 05/01/2023 156 (H)  70 - 110 mg/dL Final  . Sodium 97/94/7974 144  136 - 145 mmol/L Final  . Potassium 05/01/2023 5.4 (H)  3.6 - 5.1 mmol/L Final  . Chloride 05/01/2023 106  97 - 109 mmol/L Final  . Carbon Dioxide (CO2) 05/01/2023 28.6  22.0 - 32.0 mmol/L Final  . Urea Nitrogen (BUN) 05/01/2023 18  7 - 25 mg/dL Final  . Creatinine 97/94/7974 1.4 (H)  0.7 - 1.3 mg/dL Final  . Glomerular Filtration Rate (eGFR) 05/01/2023 54 (L)  >60 mL/min/1.73sq m Final  . Calcium 05/01/2023 9.4  8.7 - 10.3 mg/dL Final  . AST  97/94/7974 20  8 - 39 U/L Final  . ALT  05/01/2023 25  6 - 57 U/L Final  . Alk Phos (alkaline Phosphatase) 05/01/2023 107 (H)  34 - 104 U/L Final  . Albumin 05/01/2023 4.5  3.5 - 4.8 g/dL Final  . Bilirubin, Total 05/01/2023 0.7  0.3 - 1.2 mg/dL Final  . Protein, Total 05/01/2023 7.2  6.1 - 7.9 g/dL Final  . A/G Ratio 97/94/7974 1.7  1.0 - 5.0 gm/dL Final  . PSA (Prostate Specific Antigen), T* 05/01/2023 4.09 (H)  0.10 - 4.00 ng/mL Final  . Thyroid  Stimulating Hormone (TSH) 05/01/2023 3.718  0.450-5.330 uIU/ml uIU/mL Final  . Color 05/01/2023 Yellow  Colorless, Straw, Light Yellow, Yellow, Dark Yellow Final  . Clarity 05/01/2023 Cloudy (!)  Clear Final  . Specific Gravity 05/01/2023 1.022  1.005 - 1.030 Final  . pH, Urine 05/01/2023 6.0  5.0 - 8.0 Final  . Protein, Urinalysis 05/01/2023 1+ (!)  Negative mg/dL Final  .  Glucose, Urinalysis 05/01/2023 Trace (!)  Negative mg/dL Final  . Ketones, Urinalysis 05/01/2023 Negative  Negative mg/dL Final  . Blood, Urinalysis 05/01/2023 Negative  Negative Final  . Nitrite, Urinalysis 05/01/2023 Negative  Negative Final  . Leukocyte Esterase, Urinalysis 05/01/2023 Negative  Negative Final  . Hyaline Casts, Urinalysis 05/01/2023 9  /lpf Final  . Bilirubin, Urinalysis 05/01/2023 Negative  Negative Final  . Urobilinogen, Urinalysis 05/01/2023 2.0 (H)  0.2 - 1.0 mg/dL Final  . Mucous, Urine 05/01/2023 PRESENT (!)  None Seen Final  . WBC, UA 05/01/2023 3  <=5 /hpf Final  . Red Blood Cells, Urinalysis 05/01/2023 2  <=3 /hpf Final  . Bacteria, Urinalysis 05/01/2023 0-5  0 - 5 /hpf Final  . Squamous Epithelial Cells, Urinaly* 05/01/2023 1  /hpf Final  . Hemoglobin A1C 05/01/2023 6.9 (H)  4.2 - 5.6 % Final  . Average Blood Glucose (Calc) 05/01/2023 151  mg/dL Final  . Magnesium  05/01/2023 1.8  1.8 - 2.5 mg/dL Final  Initial consult on 03/15/2023  Component Date Value Ref Range Status  . Class Description - [facility] 03/15/2023 Comment   Final  . D001-IgE D pteronyssinus - [facility] 03/15/2023 <0.10  Class 0 kU/L Final  . D002-IgE D farinae Mite - [facility] 03/15/2023 <0.10  Class 0 kU/L Final  . E001-IgE Cat Hair/Dander - [facility] 03/15/2023 <0.10  Class 0 kU/L Final  . E005-IgE Dog Hair/Dander - [facility] 03/15/2023 <0.10  Class 0 kU/L Final  . G002-IgE [place] Grass - [facility] 03/15/2023 <0.10  Class 0 kU/L Final  . G008-IgE [place], [place]  - Lab* 03/15/2023 <0.10  Class 0 kU/L Final  . G010-IgE [place] - [facility] 03/15/2023 <0.10  Class 0 kU/L Final  . G017-IgE [place] - [facility] 03/15/2023 <0.10  Class 0 kU/L Final  . I206-IgE Cockroach, [place] - Lab* 03/15/2023 <0.10  Class 0 kU/L Final  . M001-IgE Penicillium chrysogen - L* 03/15/2023 <0.10  Class 0 kU/L Final  . M002-IgE Cladosporium herbarum - L* 03/15/2023 <0.10  Class 0 kU/L Final  . M003-IgE Aspergillus  fumigatus - L* 03/15/2023 <0.10  Class 0 kU/L Final  . M004-IgE Mucor racemosus - [facility] 03/15/2023 <0.10  Class 0 kU/L Final  . M006-IgE Alternaria alternata - [facility] 03/15/2023 <0.10  Class 0 kU/L Final  . M010-IgE Stemphylium herbarum - [facility]* 03/15/2023 <0.10  Class 0 kU/L Final  . T003-IgE Common Silver [place] - Lab* 03/15/2023 <0.10  Class 0 kU/L Final  . [address] Izell Pizza - [facility] 03/15/2023 <0.10  Class 0 kU/L Final  . T008-IgE Elm, American - [facility] 03/15/2023 <0.10  Class  0 kU/L Final  . T001-IgE Maple/Box Elder - [facility] 03/15/2023 <0.10  Class 0 kU/L Final  . T041-IgE Hickory, White - [facility] 03/15/2023 <0.10  Class 0 kU/L Final  . T070-IgE White Mulberry - [facility] 03/15/2023 <0.10  Class 0 kU/L Final  . Monnie Antonio, Mountain - [facility] 03/15/2023 <0.10  Class 0 kU/L Final  . W001-IgE Ragweed, Short - [facility] 03/15/2023 <0.10  Class 0 kU/L Final  . W009-IgE Plantain, English - [facility] 03/15/2023 <0.10  Class 0 kU/L Final  . W014-IgE Pigweed, Rough - [facility] 03/15/2023 <0.10  Class 0 kU/L Final  . W020-IgE Nettle - [facility] 03/15/2023 <0.10  Class 0 kU/L Final  . Eosinophil % 03/15/2023 2.3  1.0 - 5.0 % Final  Ancillary Procedure on 01/24/2023  Component Date Value Ref Range Status  . LV Ejection Fraction (%) 01/24/2023 >55%  % Final  . Tricuspid Valve Regurgitation Grade 01/24/2023 TR Not Assessed   Final  . Mitral Valve Regurgitation Grade 01/24/2023 MR Not Assessed   Final  . Mitral Valve Stenosis Grade 01/24/2023 MS Not Assessed   Final  . Aortic Valve Regurgitation Grade 01/24/2023 AR Not Assessed   Final  . Aortic Valve Stenosis Grade 01/24/2023 AS Not Assessed   Final  There may be more visits with results that are not included.   DIAGNOSIS: Primary hypertension  (primary encounter diagnosis)  Paroxysmal A-fib (CMS/HHS-HCC)  Panlobular emphysema (CMS/HHS-HCC)  Mixed hyperlipidemia  Prediabetes  Anxiety   PLAN: HTN- stable, same meds HLD- diet/exercise, labs  today Prediabetes- diet/exercise/water, labs today Daytime somnolence- trial of Provigil COPD with SOB- refer back to Pulmonology RTC 3 mo, sooner if needed     Attestation Statement:   I personally performed the service. (TP)  Reyes JONETTA Costa, MD, MD

## 2024-01-03 NOTE — Progress Notes (Signed)
 [facility] CONSULT NOTE  Patient Care Team: Joseph Reyes BIRCH, MD as PCP - General (Internal Medicine) Joseph Cindy SAUNDERS, MD as Consulting Physician (Oncology) Joseph Tinnie MATSU, NP as Nurse Practitioner (Nurse Practitioner)  REFERRING PROVIDER: Dr. 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 male with pmh of with past medical history of COPD, BPH, GERD, kidney stones, hypertension, hyperlipidemia, pancreatitis follows with Oncology for Stage 1B right lung adenocarcinoma s/p resection on surveillance.   Summary of oncologic history is as follows: Oncology History  Adenocarcinoma, lung, right (HCC)  10/02/2021 Imaging   Low dose CT lung screening  IMPRESSION: 1. Irregular 9 mm right upper lobe nodule, minimally enlarged from 07/07/2021 and essentially new from 07/07/2019. Findings are highly worrisome for primary bronchogenic carcinoma.  2. Mild subpleural reticulation and ground-glass without a definite zonal predominance, suspicious for interstitial lung disease. If further evaluation is desired, high-resolution chest CT without contrast is suggested.  MRI brain 10/04/2021 No evidence for metastasis   10/19/2021 PET scan   IMPRESSION: 1. Hypermetabolic anterior right upper lobe pulmonary nodule measures 1 cm on image 93/2 with a max SUV 5.9. 2. No hypermetabolic thoracic adenopathy. 3. No evidence of hypermetabolic distant metastatic disease. 4. Non hypermetabolic distal esophageal wall thickening may reflect sequela of gastroesophageal reflux. 5. Moderate-sized fat containing left inguinal hernia. 6. Aortic  Atherosclerosis (ICD10-I70.0) and Emphysema (ICD10-J43.9).   11/17/2021 Cancer Staging   Staging form: Lung, AJCC 8th Edition - Clinical stage from 11/17/2021: Stage IB (cT2a, cN0, cM0) - Signed by Agrawal, Kavita, MD on 12/12/2021 Stage prefix: Initial diagnosis   11/17/2021 Definitive Surgery   PROCEDURE:  Xi robotic-assisted right upper lobe wedge resection, Right upper lobectomy, Lymph node dissection and Intercostal nerve blocks levels 3 through 10 by Dr. Kerrin  Post op pneumothorax s/p chest tube. Developed Afib with RVR. On amiodarone . F/u with Cardiology.    11/17/2021 Pathology Results   A. LUNG, RIGHT UPPER LOBE, WEDGE RESECTION:  - Adenocarcinoma, 1.7 cm.  - Carcinoma involves pleural connective tissue (PL1) (pT2a).  - All surgical margins negative for carcinoma.  - See oncology table.   B. LYMPH NODE, LEVEL 8, EXCISION:  - Lymph node negative for metastatic carcinoma (0/1).   C. LYMPH NODE, LEVEL 11, EXCISION:  - Lymph node negative for metastatic carcinoma (0/1).   D. LYMPH NODE, 4R, EXCISION:  - Lymph node negative for metastatic carcinoma (0/1).   E. LYMPH NODE, 4R #2, EXCISION:  - Lymph node negative for metastatic carcinoma (0/1).   F. LYMPH NODE, LEVEL 12, EXCISION:  - Lymph node negative for metastatic carcinoma (0/1).   G. LYMPH NODE, LEVEL 10, EXCISION:  - Lymph node negative for metastatic carcinoma (0/1).   H. LYMPH NODE, LEVEL 12 #2, EXCISION:  - Lymph node negative for metastatic carcinoma (0/1).   I. LYMPH NODE, LEVEL 11, EXCISION:  - Lymph node negative for metastatic carcinoma (0/1).   J. LUNG, RIGHT UPPER LOBE, LOBECTOMY:  - Findings consistent with previous wedge excision.  - No residual carcinoma identified.  - All surgical margins negative for carcinoma.  - Three lymph nodes negative for metastatic carcinoma (0/3).   K.  LYMPH NODE, LEVEL 11 #2, EXCISION:  - Lymph node negative for metastatic carcinoma (0/1).   ONCOLOGY TABLE:   LUNG: Resection  Synchronous Tumors: Not applicable  Total Number of Primary Tumors: 1  Procedure: Right upper lobe wedge, right upper lobectomy and lymph node  biopsies  Specimen Laterality: Right upper lung lobe  Tumor Focality: Unifocal  Tumor Site: Right upper lung lobe  Tumor Size: 1.7 x 1.3 x 1 cm  Histologic Type: Adenocarcinoma, acinar type  Visceral Pleura Invasion: See comment  Direct Invasion of Adjacent Structures: No adjacent structures present  Lymphovascular Invasion: Not identified  Margins: All surgical margins negative for invasive carcinoma       Closest Margin(s) to Invasive Carcinoma: Greater than 1 cm       Margin(s) Involved by Invasive Carcinoma: Not applicable        Margin Status for Non-Invasive Tumor: Not applicable  Treatment Effect: No known presurgical therapy  Regional Lymph Nodes:       Number of Lymph Nodes Involved: 0                            Nodal Sites with Tumor: 0       Number of Lymph Nodes Examined: 12                       Nodal Sites Examined: 4, 8, 10, 11, 12  Distant Metastasis:       Distant Site(s) Involved: Not applicable  Pathologic Stage Classification (pTNM, AJCC 8th Edition): pT2a, pN0     Interval history Patient returns to clinic for ongoing surveillance. He feels well and denies complaints. No chest pain. Feels short of breath but ran up the stairs to get to his visit today. No hemoptysis. He saw pcp for extended sleep and started new medication and is going to undergo sleep study soon. He has noticed increased breathlessness since surgery but it is stable and unchanged. Not on inhalers but has f/u scheduled with Dr. Tamea. He saw his pcp this morning.   MEDICAL HISTORY:  Past Medical History:  Diagnosis Date   Anxiety    Arthritis    osteoarthritis   BPH (benign prostatic hyperplasia)    COPD (chronic obstructive pulmonary disease) (HCC)    Coronary atherosclerosis    ED (erectile dysfunction)    Elevated PSA     Erectile dysfunction    Esophageal spasm    GERD (gastroesophageal reflux disease)    History of kidney stones    passed stone   Hyperlipidemia    Hypertension    Neuralgia    Pancreatitis    Pneumonia    Rectus diastasis    Rhinitis    chronic    SURGICAL HISTORY: Past Surgical History:  Procedure Laterality Date   arthroscopic shoulder Left    CARDIAC CATHETERIZATION  2010   CHOLECYSTECTOMY     COLONOSCOPY WITH PROPOFOL  N/A 07/01/2015   Procedure: COLONOSCOPY WITH PROPOFOL ;  Surgeon: Lamar ONEIDA Holmes, MD;  Location: [facility] ENDOSCOPY;  Service: Endoscopy;  Laterality: N/A;   ESOPHAGOGASTRODUODENOSCOPY (EGD) WITH PROPOFOL  N/A 07/01/2015   Procedure: ESOPHAGOGASTRODUODENOSCOPY (EGD) WITH PROPOFOL ;  Surgeon: Lamar ONEIDA Holmes, MD;  Location: [facility] ENDOSCOPY;  Service: Endoscopy;  Laterality: N/A;   EUS  01/17/2012   Procedure: UPPER ENDOSCOPIC ULTRASOUND (EUS) LINEAR;  Surgeon: Toribio SHAUNNA Cedar, MD;  Location: [facility] ENDOSCOPY;  Service: Endoscopy;  Laterality: N/A;  radial linear  EXCISION MORTON'S NEUROMA     Left Foot   HERNIA REPAIR     Right inguinal   HOLEP-LASER ENUCLEATION OF THE PROSTATE WITH MORCELLATION N/A 06/26/2019   Procedure: HOLEP-LASER ENUCLEATION OF THE PROSTATE WITH MORCELLATION;  Surgeon: Francisca Redell BROCKS, MD;  Location: [facility] ORS;  Service: Urology;  Laterality: N/A;   INTERCOSTAL NERVE BLOCK Right 11/17/2021   Procedure: INTERCOSTAL NERVE BLOCK;  Surgeon: Kerrin Elspeth BROCKS, MD;  Location: [facility] OR;  Service: Thoracic;  Laterality: Right;   LYMPH NODE DISSECTION Right 11/17/2021   Procedure: LYMPH NODE DISSECTION;  Surgeon: Kerrin Elspeth BROCKS, MD;  Location: [facility] OR;  Service: Thoracic;  Laterality: Right;   XI ROBOTIC ASSISTED THORACOSCOPY- SEGMENTECTOMY Right 11/17/2021   Procedure: XI ROBOTIC ASSISTED THORACOSCOPY WITH RIGHT UPPER LOBECTOMY;  Surgeon: Kerrin Elspeth BROCKS, MD;  Location: [facility] OR;  Service: Thoracic;  Laterality: Right;    SOCIAL HISTORY: Social  History   Socioeconomic History   Marital status: Married    Spouse name: Not on file   Number of children: Not on file   Years of education: Not on file   Highest education level: Not on file  Occupational History   Not on file  Tobacco Use   Smoking status: Former    Current packs/day: 0.00    Average packs/day: 0.5 packs/day for 55.0 years (27.5 ttl pk-yrs)    Types: Cigarettes    Start date: 12/18/1966    Quit date: 12/17/2021    Years since quitting: 2.0   Smokeless tobacco: Never  Vaping Use   Vaping status: Never Used  Substance and Sexual Activity   Alcohol use: No    Alcohol/week: 0.0 standard drinks of alcohol   Drug use: No   Sexual activity: Yes    Birth control/protection: None  Other Topics Concern   Not on file  Social History Narrative   Not on file   Social Drivers of Health   Financial Resource Strain: Low Risk  (11/27/2023)   Received from [facility]   Overall Financial Resource Strain (CARDIA)    Difficulty of Paying Living Expenses: Not hard at all  Food Insecurity: No Food Insecurity (11/27/2023)   Received from [facility]   Hunger Vital Sign    Within the past 12 months, you worried that your food would run out before you got the money to buy more.: Never true    Within the past 12 months, the food you bought just didn't last and you didn't have money to get more.: Never true  Transportation Needs: No Transportation Needs (11/27/2023)   Received from [facility] - Transportation    In the past 12 months, has lack of transportation kept you from medical appointments or from getting medications?: No    Lack of Transportation (Non-Medical): No  Physical Activity: Not on file  Stress: Not on file  Social Connections: Not on file  Intimate Partner Violence: Not on file    FAMILY HISTORY: Family History  Problem Relation Age of Onset   Prostate cancer Brother    Kidney disease Neg Hx     Kidney cancer Neg Hx    Bladder Cancer Neg Hx     ALLERGIES:  has no known allergies.  MEDICATIONS:  Current Outpatient Medications  Medication Sig Dispense Refill   acetaminophen  (TYLENOL ) 500 MG tablet Take 1,000 mg by mouth every 6 (six) hours as needed for moderate pain or headache.  albuterol  (VENTOLIN  HFA) 108 (90 Base) MCG/ACT inhaler Inhale 2 puffs into the lungs every 6 (six) hours as needed for wheezing or shortness of breath.     amLODipine (NORVASC) 10 MG tablet Take 10 mg by mouth daily.     aspirin EC 81 MG tablet Take 81 mg by mouth.     celecoxib (CELEBREX) 200 MG capsule Take 200 mg by mouth.     cyclobenzaprine  (FLEXERIL ) 10 MG tablet Take 10 mg by mouth 3 (three) times daily as needed for muscle spasms.     fexofenadine (ALLEGRA) 180 MG tablet Take 180 mg by mouth daily as needed for allergies or rhinitis.     fluticasone  (FLONASE ) 50 MCG/ACT nasal spray Place 2 sprays into both nostrils daily.     gabapentin (NEURONTIN) 300 MG capsule Take 300 mg at night for a week then increase to 600 mg at night and continue that dose.     HYDROcodone -acetaminophen  (NORCO/VICODIN) 5-325 MG tablet Take 1 tablet by mouth.     ibuprofen (ADVIL) 200 MG tablet Take 400 mg by mouth every 6 (six) hours as needed for headache or moderate pain.     Oxycodone  HCl 10 MG TABS Take 1 tablet (10 mg total) by mouth every 6 (six) hours as needed for severe pain. 30 tablet 0   pantoprazole  (PROTONIX ) 40 MG tablet Take 40 mg by mouth every evening.     No current facility-administered medications for this visit.    REVIEW OF SYSTEMS:   Review of Systems  Constitutional:  Negative for chills, fever, malaise/fatigue and weight loss.  HENT:  Negative for hearing loss, nosebleeds, sore throat and tinnitus.   Eyes:  Negative for blurred vision and double vision.  Respiratory:  Positive for shortness of breath (with exertion- stable since surgery). Negative for cough, hemoptysis and wheezing.    Cardiovascular:  Negative for chest pain, palpitations and leg swelling.  Gastrointestinal:  Negative for abdominal pain, blood in stool, constipation, diarrhea, melena, nausea and vomiting.  Genitourinary:  Negative for dysuria and urgency.  Musculoskeletal:  Negative for back pain, falls, joint pain and myalgias.  Skin:  Negative for itching and rash.  Neurological:  Negative for dizziness, tingling, sensory change, loss of consciousness, weakness and headaches.  Endo/Heme/Allergies:  Negative for environmental allergies. Does not bruise/bleed easily.  Psychiatric/Behavioral:  Negative for depression. The patient is not nervous/anxious and does not have insomnia.    PHYSICAL EXAMINATION: ECOG PERFORMANCE STATUS: 0 - Asymptomatic Vitals:   01/03/24 1518 01/03/24 1550  BP: (!) 193/102 (!) 170/90  Pulse: 81   Resp: 18   Temp: (!) 95.9 F (35.5 C)   SpO2: 99%    Filed Weights   01/03/24 1518  Weight: 212 lb 8 oz (96.4 kg)   General: Well-developed, well-nourished, no acute distress. Eyes: Pink conjunctiva, anicteric sclera. Lungs: Clear to auscultation bilaterally.  No audible wheezing or coughing Heart: Regular rate and rhythm.  Abdomen: Soft, nontender, nondistended.  Musculoskeletal: No edema, cyanosis, or clubbing. Neuro: Alert, answering all questions appropriately. Cranial nerves grossly intact. Skin: No rashes or petechiae noted. Psych: Normal affect.   LABORATORY DATA:  No labs on site today   RADIOGRAPHIC STUDIES: I have personally reviewed the radiological images as listed and agreed with the findings in the report. CT Chest W Contrast Result Date: 12/12/2023 CLINICAL DATA:  Follow-up adenocarcinoma of the right lung. * Tracking Code: BO * EXAM: CT CHEST WITH CONTRAST TECHNIQUE: Multidetector CT imaging of the chest was performed  during intravenous contrast administration. RADIATION DOSE REDUCTION: This exam was performed according to the departmental  dose-optimization program which includes automated exposure control, adjustment of the mA and/or kV according to patient size and/or use of iterative reconstruction technique. CONTRAST:  75mL OMNIPAQUE  IOHEXOL  300 MG/ML  SOLN COMPARISON:  Multiple priors including CT May 27, 2023 FINDINGS: Cardiovascular: Aortic atherosclerosis. Normal size heart. No significant pericardial effusion/thickening. Mediastinum/Nodes: No suspicious thyroid  nodule. No pathologically enlarged mediastinal, hilar or axillary lymph nodes. Mild symmetric distal esophageal wall thickening. Lungs/Pleura: Similar postsurgical changes of right upper lobectomy without new suspicious nodularity along the suture line. A few scattered tiny pulmonary nodules are stable from prior for instance a 3 mm nodule in the left lower lobe on image 137/4 is unchanged. No new suspicious pulmonary nodules or masses. Scattered atelectasis/scarring. Biapical pleuroparenchymal scarring. Upper Abdomen: Diffuse hepatic steatosis. Bilateral adrenal glands are within normal limits. Musculoskeletal: No aggressive lytic or blastic lesion of bone. Similar vertebral body lucencies in the lower thoracic spine possibly reflecting postradiation change. Thoracic spondylosis. IMPRESSION: 1. Similar postsurgical changes of right upper lobectomy without evidence of local recurrence. 2. No convincing evidence of metastatic disease in the chest. 3. Stable scattered tiny pulmonary nodules, favored benign. 4. Mild symmetric distal esophageal wall thickening, which may reflect esophagitis. 5. Diffuse hepatic steatosis. 6. Aortic atherosclerosis. Aortic Atherosclerosis (ICD10-I70.0). Electronically Signed   By: Reyes Holder M.D.   On: 12/12/2023 14:30   ASSESSMENT & PLAN:  MONTREY BUIST 68 y.o. male with pmh with past medical history of COPD, BPH, GERD, kidney stones, hypertension, hyperlipidemia, pancreatitis, diagnosed with stage IB adenocarcinoma of the lung who returns to  clinic for surveillance and follow up.   # RUL lung adenocarcinoma, Stage Ib (pT2aN0) - Detected on low-dose CT lung screening being followed by Dr. Theotis of pulmonary.  - s/p Xi robotic-assisted right upper lobe wedge resection, Right upper lobectomy, Lymph node dissection and Intercostal nerve blocks levels 3 through 10 by Dr. Kerrin on 11/17/2021  - Path report showed 1.7 cm adenocarcinoma, acinar type, involves visceral pleura (pT2a), margins negative, LVI negative, 0/12 lymph nodes involved with cancer.  Full report below.  - no role of adjuvant chemotherapy. Foundation one testing showed no targetable mutation.   - Has been on surveillance. CT chest from 12/09/2023 was independently reviewed and I agree with the findings.  No evidence of recurrent disease.  He is clinically stable without symptoms of recurrence.  -We again reviewed NCCN surveillance guidelines for stage I disease including physical and chest CT every 6 months for 2 to 3 years then low-dose noncontrast enhanced CT annually thereafter.  I again congratulated him on continuing to abstain from tobacco.  We reviewed symptoms that would be concerning for recurrence and warrant sooner return to clinic.  He will continue to follow-up with his PCP for management of his other medical comorbidities and wellness.  # Hypertension - Per patient he had a normal blood pressure in clinic this morning.  He will follow-up with his PCP for management and continue to check blood pressure at home.  # Incidental findings - Hepatic steatosis - Esophagitis - Reviewed both of these and encouraged him to follow-up with PCP to determine if further evaluation or workup is needed  Disposition:  6 mo- ct chest w contrast 2 weeks later- see Dr Clista for lung cancer surveillance- [place]  The total time spent in the appointment was 20 minutes encounter with patients including review of chart and various tests results,  discussions about plan of  care and coordination of care plan  All questions were answered. The patient knows to call the clinic with any problems, questions or concerns. No barriers to learning was detected.  Tinnie KANDICE Dawn, NP 01/03/2024

## 2024-01-03 NOTE — Progress Notes (Signed)
 ENCOUNTER: Patient Class :No patient class for patient encounter Department: [facility] [address] [place] [address]  PATIENT: Patient Demographics      Name Patient ID SSN Gender Identity Birth Date   Umer, Harig [id number] Male 27-Apr-2055 (68 yrs)          Address Phone Email       [address] [place] [address] [phone] [email or web address]) [email or web address]            [place] Race         [facility] Caucasian/White             Reg Status PCP Date Last Verified Next Review Date     Verified Auston Reyes BIRCH [id number] 01/03/24 02/02/24           Marital Status Religion Language       Married Non-Denominational English              EMERGENCY CONTACT: Name Relationship Lgl Grd Work Marine scientist Phone  1. Tokarz,KIM Spouse Yes  [phone]    GUARANTOR: There is no guarantor information entered for this encounter.  COVERAGE: Primary Visit Coverage      Payer Plan Group Number Group Name Payer Phone Plan Phone   No coverage found                Secondary Visit Coverage      Payer Plan Group Number Group Name Payer Phone Plan Phone   No coverage found                Primary Coverage      Payer Plan Group Number Group Name Payer Phone Plan Phone   BCBS MEDICARE ADVANTAGE PLAN [place] MEDICARE [facility] [id number] Medicare Individual Group [id number]             Primary Subscriber      Subscriber ID Subscriber Name [facility] Subscriber Address   [id number] [facility] D [id number] [place], [place] [address]           Secondary Coverage      Payer Plan Group Number Group Name Payer Phone Plan Phone   No coverage found

## 2024-01-03 NOTE — Addendum Note (Signed)
 Addended by: JOSHUA ALFONSO CROME on: 01/03/2024 03:58 PM   Modules accepted: Orders

## 2024-01-03 NOTE — Progress Notes (Signed)
 CT chest 12/09/23.

## 2024-01-07 ENCOUNTER — Ambulatory Visit (INDEPENDENT_AMBULATORY_CARE_PROVIDER_SITE_OTHER): Admitting: Pulmonary Disease

## 2024-01-07 ENCOUNTER — Encounter: Payer: Self-pay | Admitting: Pulmonary Disease

## 2024-01-07 ENCOUNTER — Other Ambulatory Visit
Admission: RE | Admit: 2024-01-07 | Discharge: 2024-01-07 | Disposition: A | Discharge status: Home / Self Care | Source: Ambulatory Visit | Attending: Pulmonary Disease | Admitting: Pulmonary Disease

## 2024-01-07 VITALS — BP 112/66 | HR 66 | Temp 97.6°F | Ht 72.0 in | Wt 214.4 lb

## 2024-01-07 DIAGNOSIS — G4761 Periodic limb movement disorder: Secondary | ICD-10-CM | POA: Diagnosis not present

## 2024-01-07 DIAGNOSIS — R0602 Shortness of breath: Secondary | ICD-10-CM | POA: Diagnosis not present

## 2024-01-07 DIAGNOSIS — J449 Chronic obstructive pulmonary disease, unspecified: Secondary | ICD-10-CM

## 2024-01-07 DIAGNOSIS — J343 Hypertrophy of nasal turbinates: Secondary | ICD-10-CM

## 2024-01-07 DIAGNOSIS — R1310 Dysphagia, unspecified: Secondary | ICD-10-CM

## 2024-01-07 DIAGNOSIS — C3491 Malignant neoplasm of unspecified part of right bronchus or lung: Secondary | ICD-10-CM

## 2024-01-07 DIAGNOSIS — D649 Anemia, unspecified: Secondary | ICD-10-CM | POA: Diagnosis present

## 2024-01-07 DIAGNOSIS — Z85118 Personal history of other malignant neoplasm of bronchus and lung: Secondary | ICD-10-CM

## 2024-01-07 DIAGNOSIS — R400 Somnolence: Secondary | ICD-10-CM

## 2024-01-07 DIAGNOSIS — K219 Gastro-esophageal reflux disease without esophagitis: Secondary | ICD-10-CM

## 2024-01-07 DIAGNOSIS — J329 Chronic sinusitis, unspecified: Secondary | ICD-10-CM

## 2024-01-07 DIAGNOSIS — M4810 Ankylosing hyperostosis [Forestier], site unspecified: Secondary | ICD-10-CM

## 2024-01-07 LAB — IRON AND TIBC
Iron: 144 ug/dL (ref 45–182)
Saturation Ratios: 45 % — ABNORMAL HIGH (ref 17.9–39.5)
TIBC: 321 ug/dL (ref 250–450)
UIBC: 177 ug/dL

## 2024-01-07 LAB — FERRITIN: Ferritin: 102 ng/mL (ref 24–336)

## 2024-01-07 LAB — NITRIC OXIDE: Nitric Oxide: 23

## 2024-01-07 MED ORDER — AZELASTINE HCL 0.1 % NA SOLN: 1.0000 | Freq: Two times a day (BID) | NASAL | 11 refills | Status: AC

## 2024-01-07 MED ORDER — BREZTRI AEROSPHERE 160-9-4.8 MCG/ACT IN AERO: 2.0000 | INHALATION_SPRAY | Freq: Two times a day (BID) | RESPIRATORY_TRACT | 11 refills | Status: AC

## 2024-01-07 NOTE — Patient Instructions (Addendum)
  VISIT SUMMARY:  Today, you were seen for shortness of breath, restless legs syndrome, difficulty swallowing, and nasal congestion. We discussed your symptoms and reviewed your current medications and treatment plans.  YOUR PLAN:  -SHORTNESS OF BREATH IN THE SETTING OF MILD CHRONIC OBSTRUCTIVE PULMONARY DISEASE (COPD): COPD is a chronic lung condition that makes it hard to breathe. Although your lung function tests show minimal impairment, your shortness of breath is more pronounced. We will provide you with an inhaler to see if it helps improve your symptoms. Please follow up in two months to reassess your COPD.  -RESTLESS LEGS SYNDROME WITH EXCESSIVE DAYTIME SLEEPINESS: Restless legs syndrome causes an uncontrollable urge to move your legs, often affecting sleep. You also experience excessive daytime sleepiness. We will refer you to a sleep specialist, Dr. Pallavi Reddy, for further evaluation. We will also check your blood work to assess iron levels, as iron deficiency can contribute to your symptoms. Please hold off on taking Provigil (metoprolol ) until after your evaluation with the sleep specialist.  -DYSPHAGIA DUE TO ESOPHAGEAL NARROWING AND GASTROESOPHAGEAL REFLUX DISEASE (GERD): Dysphagia means difficulty swallowing, which in your case is due to narrowing of the esophagus. GERD is a condition where stomach acid frequently flows back into the esophagus. You should take pantoprazole  twice daily as prescribed to manage your GERD and help with your swallowing issues.  -NASAL CONGESTION: Nasal congestion is a blockage of the nasal passages, often due to swelling of the nasal tissues. To help alleviate your congestion, we recommend using Breathe Right strips at night and will prescribe a nasal spray.  INSTRUCTIONS:  Please follow up in two months for your COPD issues. Additionally, schedule an appointment with Dr. Devona Skiff, the sleep specialist, for further evaluation of your restless legs  syndrome and excessive daytime sleepiness. Make sure to get your blood work done to check your iron levels. Take pantoprazole  twice daily as prescribed and use Breathe Right strips at night along with the prescribed nasal spray for your nasal congestion.

## 2024-01-07 NOTE — Progress Notes (Signed)
 Subjective:    Patient ID: Joseph Larson, male    DOB: 09/16/1955, 68 y.o.   MRN: [id number]  Patient Care Team: Auston Reyes JONETTA, MD as PCP - General (Internal Medicine) Rennie Cindy SAUNDERS, MD as Consulting Physician (Oncology) Dasie Tinnie MATSU, NP as Nurse Practitioner (Nurse Practitioner)  Chief Complaint  Patient presents with   COPD    Shortness of breath on exertion and rest.     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 He has a history of DISH and is followed by rheumatology. Patient was initially seen here on 05 July 2023 last seen on 27 August 2023.  Missed a follow-up appointment now returns for reevaluation of dyspnea.  HPI Discussed the use of AI scribe software for clinical note transcription with the patient, who gave verbal consent to proceed.  History of Present Illness   Joseph Larson is a 68 year old male with very mild COPD who presents with shortness of breath. He was referred by Dr. Auston for re-evaluation of his shortness of breath.  He presents with his wife, [name].  He experiences significant shortness of breath that affects his ability to communicate. He has mild COPD and underwent pulmonary function tests in June, which showed mild lung function impairment. He has a history of prior lobectomy, which is noted in his lung function tests.  His dyspnea is out of proportion to his mild pulmonary impairment.  He has difficulty swallowing and was told by his oncologist that there is swelling in his esophagus. A barium swallow study in August showed that some of the barium was aspirated. He is currently taking pantoprazole  for reflux, prescribed at a dose of 20 mg twice daily, but he takes it once daily as he does not experience nighttime symptoms.  He  reports nasal congestion and has a history of sinus issues. He experiences severe PLMS, described as 'very bad' and affecting his sleep. He was initially prescribed gabapentin for the PLMS and was also prescribed Provigil for daytime sleepiness, but has not started it. He sleeps for about 12 hours but still feels sleepy during the day, especially during sedentary activities like reading.  He has not noted that gabapentin has helped the PLMS in the slightest.  He was supposed to follow up with a sleep specialist at [facility], but the appointment is not available until January. He experiences excessive daytime sleepiness, feeling 'in a fog' despite getting adequate sleep at night.     DATA 01/24/2023 echo stress test Avelina): Normal stress echo, no wall motion abnormalities at rest can peak exercise.  Patient achieved 7 METS during exercise. 03/15/2023 PFTs Avelina): FEV1 of 2.12 L or 60% predicted, FVC of 3.62 L or 75% predicted, FEV1/FVC 58% consistent with moderate obstruction.  08/05/2023 Home sleep study: No evidence of sleep apnea or significant oxygen desaturations. 08/27/2023 PFTs: FEV1 2.56 L or 72% predicted, FVC 3.71 L or 77% predicted, FEV1/FVC 69%, lung volumes at lower normal limits, diffusion capacity mildly reduced but corrects by alveolar volume.  Mild restrictive physiology with concomitant mild obstructive physiology.  Restrictive physiology likely due to prior lobectomy.  Overall function improved from prior PFTs from 15 March 2023. 09/17/2023 split-night sleep study: No significant obstructive sleep apnea.  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  Abnormal sleep  architecture with increased stage II sleep.  Reduced REM sleep.  Reduced slow-wave sleep. 12/09/2023 CT chest with contrast: Stable postsurgical changes of right upper lobectomy evidence of local recurrence.  No metastatic disease in the chest stable scattered tiny nodules favored to be benign.  Based on the wall thickening which  may be esophagitis.  Diffuse hepatic steatosis.   Review of Systems A 10 point review of systems was performed and it is as noted above otherwise negative.   Patient Active Problem List   Diagnosis Date Noted   S/P Robotic Assisted Right Upper Lobectomy, Intercostal Nerve Block, Lymph Node Dissection 11/23/2021   Lung nodule 11/17/2021   Adenocarcinoma, lung, right (HCC) 11/17/2021   Osteoarthritis of left glenohumeral joint 05/27/2017   Other specified health status 09/05/2015   Right flank pain 03/08/2015   BPH with obstruction/lower urinary tract symptoms 09/06/2014   Elevated PSA 09/06/2014   Other male erectile dysfunction 09/06/2014   Acute inflammation of the pancreas 08/18/2014   Alcohol drinker 08/18/2014   Angina pectoris 08/18/2014   Anxiety 08/18/2014   Cervical pain 08/18/2014   Chronic rhinitis 08/18/2014   Diastasis recti 08/18/2014   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   Acid reflux 08/18/2014   Headache, migraine 08/18/2014   Abdominal hernia 08/18/2014   Hemorrhoid 08/18/2014   HLD (hyperlipidemia) 08/18/2014   BP (high blood pressure) 08/18/2014   Low back pain 08/18/2014   Nerve pain 08/18/2014   Arthritis, degenerative 08/18/2014   Compulsive tobacco user syndrome 08/18/2014   Lumbar canal stenosis 07/07/2014   Neuritis or radiculitis due to rupture of lumbar intervertebral disc 05/14/2014   DDD (degenerative disc disease), lumbar 05/03/2014   Low back strain 05/03/2014   Abnormal prostate specific antigen 01/25/2014   Pancreatitis 01/17/2012    Social History   Tobacco Use   Smoking status: Former    Current packs/day: 0.00    Average packs/day: 0.5 packs/day for 55.0 years (27.5 ttl pk-yrs)    Types: Cigarettes    Start date: 12/18/1966    Quit date: 12/17/2021    Years since quitting: 2.0   Smokeless tobacco: Never  Substance Use Topics   Alcohol use: No    Alcohol/week: 0.0 standard  drinks of alcohol    No Known Allergies  Current Meds  Medication Sig   acetaminophen  (TYLENOL ) 500 MG tablet Take 1,000 mg by mouth every 6 (six) hours as needed for moderate pain or headache.   albuterol  (VENTOLIN  HFA) 108 (90 Base) MCG/ACT inhaler Inhale 2 puffs into the lungs every 6 (six) hours as needed for wheezing or shortness of breath.   amLODipine (NORVASC) 10 MG tablet Take 10 mg by mouth daily.   amoxicillin  (AMOXIL ) 500 MG capsule Take 500 mg by mouth 3 (three) times daily.   aspirin EC 81 MG tablet Take 81 mg by mouth.   azelastine (ASTELIN) 0.1 % nasal spray Place 1 spray into both nostrils 2 (two) times daily.   [START ON 01/27/2024] budesonide-glycopyrrolate-formoterol (BREZTRI  AEROSPHERE) 160-9-4.8 MCG/ACT AERO inhaler Inhale 2 puffs into the lungs in the morning and at bedtime.   cyclobenzaprine  (FLEXERIL ) 10 MG tablet Take 10 mg by mouth 3 (three) times daily as needed for muscle spasms.   DULoxetine (CYMBALTA) 30 MG capsule Take by mouth daily.   fexofenadine (ALLEGRA) 180 MG tablet Take 180 mg by mouth daily as needed for allergies or rhinitis.   gabapentin (NEURONTIN) 300 MG capsule Take 300 mg at night  for a week then increase to 600 mg at night and continue that dose.   HYDROcodone -acetaminophen  (NORCO/VICODIN) 5-325 MG tablet Take 1 tablet by mouth.   ibuprofen (ADVIL) 200 MG tablet Take 400 mg by mouth every 6 (six) hours as needed for headache or moderate pain.   modafinil (PROVIGIL) 100 MG tablet Take 100 mg by mouth daily.   Oxycodone  HCl 10 MG TABS Take 1 tablet (10 mg total) by mouth every 6 (six) hours as needed for severe pain.   pantoprazole  (PROTONIX ) 40 MG tablet Take 40 mg by mouth every evening.   [DISCONTINUED] fluticasone  (FLONASE ) 50 MCG/ACT nasal spray Place 2 sprays into both nostrils daily. (Patient taking differently: Place 2 sprays into both nostrils as needed.)    Immunization History  Administered Date(s) Administered   Influenza, Mdck,  Trivalent,PF 6+ MOS(egg free) 01/03/2023   PFIZER(Purple Top)SARS-COV-2 Vaccination 05/09/2019, 06/02/2019        Objective:     BP 112/66   Pulse 66   Temp 97.6 F (36.4 C) (Temporal)   Ht 6' (1.829 m)   Wt 214 lb 6.4 oz (97.3 kg)   SpO2 98%   BMI 29.08 kg/m   SpO2: 98 %  GENERAL: Well-developed, overweight gentleman, no acute distress.  Mild conversational dyspnea.  Fully ambulatory.  Nasal quality to speech. HEAD: Normocephalic, atraumatic.  EYES: Pupils equal, round, reactive to light.  No scleral icterus.  NOSE: Significant turbinate edema.  Nasal passages almost obliterated. MOUTH: Dentition intact, oral mucosa moist.  Mallampati IV airway. NECK: Supple. No thyromegaly. Trachea midline. No JVD.  No adenopathy. PULMONARY: Good air entry bilaterally.  Coarse, rare faint end expiratory wheezes noted. CARDIOVASCULAR: S1 and S2. Regular rate and rhythm.  No rubs, murmurs or gallops heard. ABDOMEN: Mildly protuberant, otherwise benign. MUSCULOSKELETAL: No joint deformity, no clubbing, no edema.  NEUROLOGIC: No overt focal deficit, no gait disturbance, speech is fluent. SKIN: Intact,warm,dry. PSYCH: Mildly depressed mood, normal behavior.  Lab Results  Component Value Date   NITRICOXIDE 23 01/07/2024  *Low level of nitric oxide consistent with no significant type II inflammation   Assessment & Plan:     ICD-10-CM   1. Stage 1 mild COPD by GOLD classification (HCC)  J44.9     2. Shortness of breath  R06.02 Nitric oxide    3. Chronic rhinosinusitis  J32.9     4. RLS (restless legs syndrome)  G25.81     5. Uncontrolled daytime somnolence  R40.0     6. Nasal turbinate hypertrophy  J34.3     7. Anemia, unspecified type  D64.9 Iron, TIBC and Ferritin Panel    8. Adenocarcinoma, lung, right (HCC)  C34.91     9. DISH (diffuse idiopathic skeletal hyperostosis)  M48.10       Orders Placed This Encounter  Procedures   Iron, TIBC and Ferritin Panel    Standing  Status:   Future    Expiration Date:   01/06/2025   Nitric oxide    Meds ordered this encounter  Medications   azelastine (ASTELIN) 0.1 % nasal spray    Sig: Place 1 spray into both nostrils 2 (two) times daily.    Dispense:  30 mL    Refill:  11   budesonide-glycopyrrolate-formoterol (BREZTRI  AEROSPHERE) 160-9-4.8 MCG/ACT AERO inhaler    Sig: Inhale 2 puffs into the lungs in the morning and at bedtime.    Dispense:  11 each    Refill:  11   Discussion:    Shortness  of breath in the setting of mild chronic obstructive pulmonary disease (COPD) Shortness of breath is more pronounced than pulmonary function tests suggest. Lung function tests from June show mild impairment despite prior lobectomy. Wheezing noted on examination. Inflammation level in the airway is normal. - Provide a trial of an inhaler, Breztri , with samples to assess improvement in symptoms - Follow up in two months for COPD issues  PLMD with excessive daytime sleepiness PLMD is severe and affecting sleep. Gabapentin was started, but Provigil (metoprolol ) was prescribed and may worsen symptoms. Excessive daytime sleepiness persists worsened but gabapentin. Iron deficiency may contribute to symptoms. - Refer to sleep specialist, Dr. Devona Skiff, for evaluation - Check blood work to assess iron levels - Instruct to hold off on taking Provigil (modafinil) until after sleep specialist evaluation - Modafinil can worsen PLMD  Dysphagia due to esophageal narrowing and gastroesophageal reflux disease (GERD) Dysphagia is present due to esophageal narrowing. GERD is managed with pantoprazole , but only taken once daily instead of prescribed twice daily. Barium swallow showed aspiration into the lung, however, final report not available. - Instruct to take pantoprazole  twice daily as prescribed - Consider GI evaluation  Nasal congestion Chronic nasal congestion noted. Examination suggests nasal obstruction contributing to  breathing difficulties.  He may have an element of upper airway resistance syndrome. - Advise use of Breathe Right strips at night - Prescribe nasal spray to alleviate congestion (azelastine)   See the patient back in 2 months time call sooner should any problems arise.  Advised if symptoms do not improve or worsen, to please contact office for sooner follow up or seek emergency care.    I spent 40 minutes of dedicated to the care of this patient on the date of this encounter to include pre-visit review of records, face-to-face time with the patient discussing conditions above, post visit ordering of testing, clinical documentation with the electronic health record, making appropriate referrals as documented, and communicating necessary findings to members of the patients care team.     C. Leita Sanders, MD Advanced Bronchoscopy P[facility]    *This note was generated using voice recognition software/Dragon and/or AI transcription program.  Despite best efforts to proofread, errors can occur which can change the meaning. Any transcriptional errors that result from this process are unintentional and may not be fully corrected at the time of dictation.

## 2024-01-09 ENCOUNTER — Encounter: Payer: Self-pay | Admitting: Sleep Medicine

## 2024-01-09 ENCOUNTER — Ambulatory Visit (INDEPENDENT_AMBULATORY_CARE_PROVIDER_SITE_OTHER): Admitting: Sleep Medicine

## 2024-01-09 VITALS — BP 130/68 | HR 73 | Ht 72.0 in | Wt 210.0 lb

## 2024-01-09 DIAGNOSIS — I10 Essential (primary) hypertension: Secondary | ICD-10-CM

## 2024-01-09 DIAGNOSIS — G4761 Periodic limb movement disorder: Secondary | ICD-10-CM | POA: Diagnosis not present

## 2024-01-09 DIAGNOSIS — G4721 Circadian rhythm sleep disorder, delayed sleep phase type: Secondary | ICD-10-CM

## 2024-01-09 NOTE — Progress Notes (Signed)
 Name:Joseph Larson MRN: [id number] DOB: Dec 11, 1955   CHIEF COMPLAINT:  EXCESSIVE DAYTIME SLEEPINESS   HISTORY OF PRESENT ILLNESS: Joseph Larson is a 68 y.o. w/ a h/o PLMD, COPD, HTN, GERD and who present for c/o loud snoring and occasional excessive daytime sleepiness which has been present for several years. Denies any nocturnal awakenings. Denies any significant weight changes. Denies morning headaches, RLS symptoms, dream enactment, cataplexy, hypnagogic or hypnapompic hallucinations. Reports a family history of sleep apnea. Denies drowsy driving. Drinks 1 cup of coffee daily, occasional alcohol use, denies tobacco or illicit drug use.   The patient underwent PSG which revealed severe PLMD without significant sleep disordered breathing.   Bedtime 8-9 pm Sleep onset 30 mins Rise time 10:30 am   EPWORTH SLEEP SCORE     07/05/2023   10:35 AM  Results of the Epworth flowsheet  Sitting and reading 2  Watching TV 0  Sitting, inactive in a public place (e.g. a theatre or a meeting) 1  As a passenger in a car for an hour without a break 2  Lying down to rest in the afternoon when circumstances permit 3  Sitting and talking to someone 0  Sitting quietly after a lunch without alcohol 1  In a car, while stopped for a few minutes in traffic 0  Total score 9    PAST MEDICAL HISTORY :   has a past medical history of Anxiety, Arthritis, BPH (benign prostatic hyperplasia), COPD (chronic obstructive pulmonary disease) (HCC), Coronary atherosclerosis, ED (erectile dysfunction), Elevated PSA, Erectile dysfunction, Esophageal spasm, GERD (gastroesophageal reflux disease), History of kidney stones, Hyperlipidemia, Hypertension, Neuralgia, Pancreatitis, Pneumonia, Rectus diastasis, and Rhinitis.  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 Prior to Admission medications   Medication Sig Start Date End Date Taking? Authorizing Provider  acetaminophen  (TYLENOL ) 500 MG tablet Take 1,000 mg by mouth every 6 (six) hours as needed for moderate pain or headache.   Yes [provider]  albuterol  (VENTOLIN  HFA) 108 (90 Base) MCG/ACT inhaler Inhale 2 puffs into the lungs every 6 (six) hours as needed for wheezing or shortness of breath.   Yes [provider]  amLODipine (NORVASC) 10 MG tablet Take 10 mg by mouth daily. 07/29/23  Yes [provider]  amoxicillin  (AMOXIL ) 500 MG capsule Take 500 mg by mouth 3 (three) times daily. 01/02/24  Yes [provider]  aspirin EC 81 MG tablet Take 81 mg by mouth.   Yes [provider]  azelastine (ASTELIN) 0.1 % nasal spray Place 1 spray into both nostrils 2 (two) times daily. 01/07/24 01/06/25 Yes Tamea Dedra CROME, MD  budesonide-glycopyrrolate-formoterol (BREZTRI  AEROSPHERE) 160-9-4.8 MCG/ACT AERO inhaler Inhale 2 puffs into the lungs in the morning and at bedtime. 01/27/24  Yes Tamea Dedra CROME, MD  cyclobenzaprine  (FLEXERIL ) 10 MG tablet Take 10 mg by mouth 3 (three) times daily as needed for muscle spasms.   Yes [provider]  DULoxetine (CYMBALTA) 30 MG capsule Take by mouth daily. 12/10/23  Yes [provider]  fexofenadine (ALLEGRA) 180 MG tablet Take 180 mg by mouth daily as needed for allergies or rhinitis.   Yes [provider]  gabapentin (NEURONTIN) 300 MG capsule Take 300 mg at night for a week then increase to 600 mg at night and continue that dose. 11/28/23  Yes [provider]   HYDROcodone -acetaminophen  (NORCO/VICODIN) 5-325 MG tablet Take 1 tablet by mouth. 08/30/23  Yes [provider]  ibuprofen (ADVIL) 200 MG tablet Take 400 mg by mouth every 6 (six) hours as needed for headache or moderate pain.   Yes [provider]  modafinil (PROVIGIL) 100 MG tablet Take 100 mg by mouth daily. 01/03/24  Yes [provider]  Oxycodone  HCl 10 MG TABS Take 1 tablet (10 mg total) by mouth every 6 (six) hours as needed for severe pain. 11/24/21  Yes Zimmerman, Donielle M, PA-C  pantoprazole  (PROTONIX ) 40 MG tablet Take 40 mg by mouth every evening.   Yes [provider]  celecoxib (CELEBREX) 200 MG capsule Take 200 mg by mouth. Patient not taking: Reported on 01/09/2024 12/10/23 03/09/24  [provider]   No Known Allergies  FAMILY HISTORY:  family history includes Prostate cancer in his brother. SOCIAL HISTORY:  reports that he quit smoking about 2 years ago. His smoking use included cigarettes. He started smoking about 57 years ago. He has a 27.5 pack-year smoking history. He has never used smokeless tobacco. He reports that he does not drink alcohol and does not use drugs.   Review of Systems:  Gen:  Denies  fever, sweats, chills weight loss  HEENT: Denies blurred vision, double vision, ear pain, eye pain, hearing loss, nose bleeds, sore throat Cardiac:  No dizziness, chest pain or heaviness, chest tightness,edema, No JVD Resp:   No cough, -sputum production, -shortness of breath,-wheezing, -hemoptysis,  Gi: Denies swallowing difficulty, stomach pain, nausea or vomiting, diarrhea, constipation, bowel incontinence Gu:  Denies bladder incontinence, burning urine Ext:   Denies Joint pain, stiffness or swelling Skin: Denies  skin rash, easy bruising or bleeding or hives Endoc:  Denies polyuria, polydipsia , polyphagia or weight change Psych:   Denies depression, insomnia or hallucinations  Other:  All other systems negative  VITAL  SIGNS: BP 130/68   Pulse 73   Ht 6' (1.829 m)   Wt 210 lb (95.3 kg)   SpO2 98%   BMI 28.48 kg/m    Physical Examination:   General Appearance: No distress  EYES PERRLA, EOM intact.   NECK Supple, No JVD Pulmonary: normal breath sounds, No wheezing.  CardiovascularNormal S1,S2.  No m/r/g.   Abdomen: Benign, Soft, non-tender. Skin:   warm, no rashes, no ecchymosis  Extremities: normal, no cyanosis, clubbing. Neuro:without focal findings,  speech normal  PSYCHIATRIC: Mood, affect within normal limits.   ASSESSMENT AND PLAN  Delayed sleep phase syndrome Counseled patient on moving bedtime back 1-2 hours and to limit time in bed to 8 hours daily. I suspect that excessive time in bed in causing excessive daytime sleepiness.   PLMD Counseled patient on behavioral changes including increasing regular exercise, decreasing caffeine intake and trying a weighted blanket.   HTN Stable, on current management. Following with PCP.    Patient  satisfied with Plan of action and management. All questions answered  I spent a total of 58 minutes reviewing chart data, face-to-face evaluation with the patient, counseling and coordination of care as detailed above.    Finnley Larusso, M.D.  Sleep Medicine [facility]

## 2024-01-09 NOTE — Patient Instructions (Addendum)
 SABRA

## 2024-01-13 ENCOUNTER — Encounter: Payer: Self-pay | Admitting: Nurse Practitioner

## 2024-01-24 ENCOUNTER — Telehealth: Payer: Self-pay

## 2024-01-24 NOTE — Telephone Encounter (Signed)
 Copied from CRM #[id number]. Topic: Clinical - Prescription Issue >> Jan 24, 2024  9:38 AM Joseph Larson wrote: Reason for CRM: Patient states his pharmacy made an error when attempting to fill his budesonide-glycopyrrolate-formoterol (BREZTRI  AEROSPHERE) 160-9-4.8 MCG/ACT AERO inhaler - patient states this will not be resolved until Monday,  In the meantime patient states he will run out of the samples of Breztri  provided to him by Dr. Tamea and is inquiring on if there are more samples available that he can pick up today so that he does not go without an inhaler all weekend.   Please reach patient at: [phone] (W)

## 2024-01-24 NOTE — Telephone Encounter (Signed)
 LVM on wife's voicemail per DPR to alert that sample is available for him at the front desk.

## 2024-01-24 NOTE — Telephone Encounter (Signed)
 NFN

## 2024-01-24 NOTE — Telephone Encounter (Signed)
 Okay to provide a sample if we have them.

## 2024-03-12 ENCOUNTER — Ambulatory Visit: Admitting: Pulmonary Disease

## 2024-04-17 ENCOUNTER — Encounter: Payer: Self-pay | Admitting: Pulmonary Disease

## 2024-04-17 ENCOUNTER — Ambulatory Visit: Admitting: Pulmonary Disease

## 2024-04-17 VITALS — BP 128/74 | HR 84 | Temp 97.9°F | Ht 72.0 in | Wt 212.0 lb

## 2024-04-17 DIAGNOSIS — Z87891 Personal history of nicotine dependence: Secondary | ICD-10-CM | POA: Diagnosis not present

## 2024-04-17 DIAGNOSIS — R065 Mouth breathing: Secondary | ICD-10-CM

## 2024-04-17 DIAGNOSIS — J329 Chronic sinusitis, unspecified: Secondary | ICD-10-CM

## 2024-04-17 DIAGNOSIS — J3489 Other specified disorders of nose and nasal sinuses: Secondary | ICD-10-CM | POA: Diagnosis not present

## 2024-04-17 DIAGNOSIS — M4810 Ankylosing hyperostosis [Forestier], site unspecified: Secondary | ICD-10-CM

## 2024-04-17 DIAGNOSIS — R0602 Shortness of breath: Secondary | ICD-10-CM

## 2024-04-17 DIAGNOSIS — J449 Chronic obstructive pulmonary disease, unspecified: Secondary | ICD-10-CM | POA: Diagnosis not present

## 2024-04-17 DIAGNOSIS — G478 Other sleep disorders: Secondary | ICD-10-CM

## 2024-04-17 NOTE — Patient Instructions (Signed)
 VISIT SUMMARY:  During your visit, we discussed your shortness of breath and sleep disturbances. We reviewed your current medications and their potential impact on your symptoms. We also discussed your nasal obstruction and breathing issues, and we have planned further evaluations to address these concerns.  YOUR PLAN:  -UPPER AIRWAY RESISTANCE SYNDROME: Upper airway resistance syndrome is a condition where the upper airway is partially blocked during sleep, leading to poor sleep quality and daytime fatigue. We suspect this due to your nasal obstruction and mouth breathing. We have ordered a sinus CT to evaluate the nasal obstruction and will consider referring you to an ENT specialist if further intervention is needed.  -CHRONIC RHINOSINUSITIS: Chronic rhinosinusitis is a long-term inflammation of the sinuses, causing nasal discharge and obstruction. We have ordered a sinus CT to evaluate your sinus condition and determine the best course of action.  -CHRONIC OBSTRUCTIVE PULMONARY DISEASE (COPD): COPD is a chronic inflammatory lung disease that causes obstructed airflow from the lungs. Your symptoms do not seem to be primarily driven by lung issues, and you should continue using your Ventolin  inhaler as needed.  INSTRUCTIONS:  Please complete the sinus CT as ordered. Based on the results, we may refer you to an ENT specialist for further evaluation and treatment. Continue using your Ventolin  inhaler as needed for your COPD symptoms.
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
## 2024-04-23 ENCOUNTER — Telehealth: Payer: Self-pay | Admitting: Pulmonary Disease

## 2024-04-23 DIAGNOSIS — G478 Other sleep disorders: Secondary | ICD-10-CM

## 2024-04-23 DIAGNOSIS — J329 Chronic sinusitis, unspecified: Secondary | ICD-10-CM

## 2024-04-23 DIAGNOSIS — J343 Hypertrophy of nasal turbinates: Secondary | ICD-10-CM

## 2024-04-23 NOTE — Telephone Encounter (Signed)
 I have been trying to get this patients sinus ct approved. I have received a letter from insurance they are denying because they feel it is not medically necessary.

## 2024-04-23 NOTE — Telephone Encounter (Signed)
 Will have to refer him to ENT and have them do it in office.

## 2024-04-24 ENCOUNTER — Other Ambulatory Visit: Payer: Self-pay | Admitting: Family Medicine

## 2024-04-24 DIAGNOSIS — M47812 Spondylosis without myelopathy or radiculopathy, cervical region: Secondary | ICD-10-CM

## 2024-04-24 DIAGNOSIS — M5416 Radiculopathy, lumbar region: Secondary | ICD-10-CM

## 2024-04-28 ENCOUNTER — Other Ambulatory Visit: Payer: Self-pay | Admitting: Family Medicine

## 2024-04-28 DIAGNOSIS — M47812 Spondylosis without myelopathy or radiculopathy, cervical region: Secondary | ICD-10-CM

## 2024-04-28 DIAGNOSIS — T1590XA Foreign body on external eye, part unspecified, unspecified eye, initial encounter: Secondary | ICD-10-CM

## 2024-04-28 DIAGNOSIS — Y249XXA Unspecified firearm discharge, undetermined intent, initial encounter: Secondary | ICD-10-CM

## 2024-04-29 NOTE — Telephone Encounter (Signed)
 The ENT referral will need to be placed

## 2024-04-30 NOTE — Telephone Encounter (Signed)
 Patient advised. Requesting referral to [facility].

## 2024-05-07 ENCOUNTER — Other Ambulatory Visit

## 2024-06-18 ENCOUNTER — Ambulatory Visit: Admitting: Pulmonary Disease

## 2024-07-03 ENCOUNTER — Other Ambulatory Visit

## 2024-07-14 ENCOUNTER — Ambulatory Visit: Admitting: Internal Medicine

## 2024-07-14 ENCOUNTER — Other Ambulatory Visit
